# Patient Record
Sex: Female | Born: 1985 | Race: Black or African American | Hispanic: No | Marital: Married | Smoking: Never smoker
Health system: Southern US, Community
[De-identification: ages and names within clinical notes are randomized; demographics above are authoritative.]

## PROBLEM LIST (undated history)

## (undated) DIAGNOSIS — O0991 Supervision of high risk pregnancy, unspecified, first trimester: Secondary | ICD-10-CM

## (undated) DIAGNOSIS — D649 Anemia, unspecified: Secondary | ICD-10-CM

## (undated) DIAGNOSIS — E559 Vitamin D deficiency, unspecified: Secondary | ICD-10-CM

## (undated) DIAGNOSIS — E611 Iron deficiency: Secondary | ICD-10-CM

## (undated) DIAGNOSIS — Z803 Family history of malignant neoplasm of breast: Secondary | ICD-10-CM

## (undated) DIAGNOSIS — U071 COVID-19: Secondary | ICD-10-CM

## (undated) DIAGNOSIS — D219 Benign neoplasm of connective and other soft tissue, unspecified: Secondary | ICD-10-CM

## (undated) HISTORY — DX: COVID-19: U07.1

## (undated) HISTORY — DX: Benign neoplasm of connective and other soft tissue, unspecified: D21.9

## (undated) HISTORY — DX: Supervision of high risk pregnancy, unspecified, first trimester: O09.91

## (undated) HISTORY — DX: Anemia, unspecified: D64.9

## (undated) HISTORY — DX: Iron deficiency: E61.1

## (undated) HISTORY — DX: Family history of malignant neoplasm of breast: Z80.3

## (undated) HISTORY — DX: Vitamin D deficiency, unspecified: E55.9

## (undated) HISTORY — PX: BUTTOCK LIFT: SHX1278

---

## 2010-11-16 ENCOUNTER — Ambulatory Visit: Payer: Self-pay | Admitting: General Practice

## 2010-12-06 ENCOUNTER — Ambulatory Visit: Payer: Self-pay | Admitting: General Practice

## 2011-05-18 ENCOUNTER — Encounter (HOSPITAL_COMMUNITY): Payer: Self-pay | Admitting: *Deleted

## 2011-05-18 ENCOUNTER — Emergency Department: Payer: Self-pay | Admitting: Emergency Medicine

## 2011-05-18 ENCOUNTER — Emergency Department (HOSPITAL_COMMUNITY)
Admission: EM | Admit: 2011-05-18 | Discharge: 2011-05-18 | Disposition: A | Discharge status: Home / Self Care | Payer: No Typology Code available for payment source | Attending: Emergency Medicine | Admitting: Emergency Medicine

## 2011-05-18 DIAGNOSIS — M545 Low back pain, unspecified: Secondary | ICD-10-CM | POA: Insufficient documentation

## 2011-05-18 DIAGNOSIS — T148XXA Other injury of unspecified body region, initial encounter: Secondary | ICD-10-CM | POA: Insufficient documentation

## 2011-05-18 DIAGNOSIS — E119 Type 2 diabetes mellitus without complications: Secondary | ICD-10-CM | POA: Insufficient documentation

## 2011-05-18 DIAGNOSIS — M542 Cervicalgia: Secondary | ICD-10-CM | POA: Insufficient documentation

## 2011-05-18 MED ORDER — CYCLOBENZAPRINE HCL 10 MG PO TABS
5.0000 mg | ORAL_TABLET | Freq: Once | ORAL | Status: AC
  Administered 2011-05-18: 5 mg via ORAL
  Filled 2011-05-18: qty 1

## 2011-05-18 MED ORDER — IBUPROFEN 800 MG PO TABS: 800.0000 mg | ORAL_TABLET | Freq: Three times a day (TID) | ORAL | Status: AC

## 2011-05-18 MED ORDER — IBUPROFEN 800 MG PO TABS
800.0000 mg | ORAL_TABLET | Freq: Once | ORAL | Status: AC
  Administered 2011-05-18: 800 mg via ORAL
  Filled 2011-05-18: qty 1

## 2011-05-18 MED ORDER — CYCLOBENZAPRINE HCL 10 MG PO TABS: 10.0000 mg | ORAL_TABLET | Freq: Two times a day (BID) | ORAL | Status: AC | PRN

## 2011-05-18 NOTE — ED Notes (Signed)
Patient given discharge paperwork; went over discharge instructions with patient.  Patient instructed to take Flexeril and ibuprofen as directed, to follow up with referral (orthopedist), and to return to the ED for new, worsening, or concerning symptoms.
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
## 2011-05-18 NOTE — ED Provider Notes (Signed)
Medical screening examination/treatment/procedure(s) were performed by non-physician practitioner and as supervising physician I was immediately available for consultation/collaboration.   Sunnie Nielsen, MD 05/18/11 [phone]
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
## 2011-05-18 NOTE — ED Notes (Signed)
PA at bedside.
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
## 2011-05-18 NOTE — ED Notes (Signed)
Patient currently sitting up in bed; no respiratory or acute distress noted.  Patient updated on plan of care; informed patient that we are currently waiting on orders/disposition from PA.  Patient has no other questions or concerns at this time; will continue to monitor.

## 2011-09-19 ENCOUNTER — Other Ambulatory Visit: Payer: Self-pay | Admitting: Internal Medicine

## 2011-09-19 LAB — CBC WITH DIFFERENTIAL/PLATELET
Eosinophil #: 0.1 10*3/uL (ref 0.0–0.7)
Eosinophil %: 2.4 %
Lymphocyte #: 2.1 10*3/uL (ref 1.0–3.6)
Lymphocyte %: 37 %
MCV: 90 fL (ref 80–100)
Monocyte %: 7.3 %
Platelet: 254 10*3/uL (ref 150–440)
RBC: 4.6 10*6/uL (ref 3.80–5.20)
WBC: 5.8 10*3/uL (ref 3.6–11.0)

## 2011-09-19 LAB — COMPREHENSIVE METABOLIC PANEL
Alkaline Phosphatase: 49 U/L — ABNORMAL LOW (ref 50–136)
Bilirubin,Total: 0.4 mg/dL (ref 0.2–1.0)
Calcium, Total: 9 mg/dL (ref 8.5–10.1)
Creatinine: 0.67 mg/dL (ref 0.60–1.30)
EGFR (African American): >60
EGFR (Non-African Amer.): >60
Glucose: 96 mg/dL (ref 65–99)
SGOT(AST): 15 U/L (ref 15–37)
SGPT (ALT): 30 U/L (ref 12–78)
Sodium: 139 mmol/L (ref 136–145)

## 2011-09-19 LAB — LIPID PANEL
Cholesterol: 138 mg/dL (ref 0–200)
HDL Cholesterol: 46 mg/dL (ref 40–60)
Triglycerides: 48 mg/dL (ref 0–200)

## 2011-09-26 ENCOUNTER — Other Ambulatory Visit: Payer: Self-pay | Admitting: Internal Medicine

## 2011-09-28 ENCOUNTER — Ambulatory Visit: Payer: Self-pay

## 2012-05-02 ENCOUNTER — Emergency Department: Payer: Self-pay | Admitting: Internal Medicine

## 2012-05-11 ENCOUNTER — Ambulatory Visit: Payer: Self-pay | Admitting: Obstetrics & Gynecology

## 2012-06-04 ENCOUNTER — Ambulatory Visit: Payer: Self-pay | Admitting: Obstetrics & Gynecology

## 2012-06-15 ENCOUNTER — Encounter: Payer: Self-pay | Admitting: Obstetrics and Gynecology

## 2012-06-15 LAB — PROTEIN / CREATININE RATIO, URINE
Creatinine, Urine: 46.2 mg/dL (ref 30.0–125.0)
Protein, Random Urine: <5 mg/dL — ABNORMAL LOW (ref 0–12)

## 2012-06-15 LAB — COMPREHENSIVE METABOLIC PANEL
Alkaline Phosphatase: 46 U/L — ABNORMAL LOW (ref 50–136)
Anion Gap: 7 (ref 7–16)
Co2: 24 mmol/L (ref 21–32)
Creatinine: 0.55 mg/dL — ABNORMAL LOW (ref 0.60–1.30)
Osmolality: 271 (ref 275–301)
SGPT (ALT): 28 U/L (ref 12–78)
Sodium: 137 mmol/L (ref 136–145)

## 2012-07-06 ENCOUNTER — Encounter: Payer: Self-pay | Admitting: Obstetrics and Gynecology

## 2012-08-10 ENCOUNTER — Encounter: Payer: Self-pay | Admitting: Maternal & Fetal Medicine

## 2012-09-07 ENCOUNTER — Encounter: Payer: Self-pay | Admitting: Obstetrics & Gynecology

## 2012-10-26 ENCOUNTER — Inpatient Hospital Stay: Payer: Self-pay

## 2012-10-26 LAB — CBC WITH DIFFERENTIAL/PLATELET
Basophil %: 0.1 %
HGB: 13 g/dL (ref 12.0–16.0)
Lymphocyte #: 1.3 10*3/uL (ref 1.0–3.6)
Lymphocyte %: 16.6 %
Platelet: 181 10*3/uL (ref 150–440)
RDW: 14.5 % (ref 11.5–14.5)
WBC: 8 10*3/uL (ref 3.6–11.0)

## 2013-09-07 ENCOUNTER — Ambulatory Visit: Payer: Self-pay | Admitting: Specialist

## 2013-09-09 ENCOUNTER — Ambulatory Visit: Payer: Self-pay | Admitting: Specialist

## 2013-09-17 ENCOUNTER — Ambulatory Visit: Payer: Self-pay | Admitting: Specialist

## 2013-09-21 ENCOUNTER — Other Ambulatory Visit: Payer: Self-pay | Admitting: Internal Medicine

## 2013-09-21 ENCOUNTER — Other Ambulatory Visit: Payer: Self-pay | Admitting: Specialist

## 2013-09-21 LAB — COMPREHENSIVE METABOLIC PANEL
ALBUMIN: 3.6 g/dL (ref 3.4–5.0)
ALT: 34 U/L
ANION GAP: 9 (ref 7–16)
Alkaline Phosphatase: 82 U/L
BILIRUBIN TOTAL: 0.3 mg/dL (ref 0.2–1.0)
BUN: 10 mg/dL (ref 7–18)
Calcium, Total: 8.4 mg/dL — ABNORMAL LOW (ref 8.5–10.1)
Chloride: 107 mmol/L (ref 98–107)
Co2: 26 mmol/L (ref 21–32)
Creatinine: 0.69 mg/dL (ref 0.60–1.30)
GLUCOSE: 109 mg/dL — AB (ref 65–99)
Osmolality: 283 (ref 275–301)
POTASSIUM: 3.5 mmol/L (ref 3.5–5.1)
SGOT(AST): 20 U/L (ref 15–37)
Sodium: 142 mmol/L (ref 136–145)
TOTAL PROTEIN: 6.9 g/dL (ref 6.4–8.2)

## 2013-09-21 LAB — LIPID PANEL
CHOLESTEROL: 97 mg/dL (ref 0–200)
HDL: 31 mg/dL — AB (ref 40–60)
LDL CHOLESTEROL, CALC: 50 mg/dL (ref 0–100)
TRIGLYCERIDES: 82 mg/dL (ref 0–200)
VLDL CHOLESTEROL, CALC: 16 mg/dL (ref 5–40)

## 2013-09-21 LAB — CBC WITH DIFFERENTIAL/PLATELET
Basophil #: 0 10*3/uL (ref 0.0–0.1)
Basophil %: 0.2 %
EOS ABS: 0.2 10*3/uL (ref 0.0–0.7)
Eosinophil %: 3.5 %
HCT: 41.1 % (ref 35.0–47.0)
HGB: 13.9 g/dL (ref 12.0–16.0)
LYMPHS ABS: 1.7 10*3/uL (ref 1.0–3.6)
Lymphocyte %: 35.6 %
MCH: 30.6 pg (ref 26.0–34.0)
MCHC: 33.8 g/dL (ref 32.0–36.0)
MCV: 91 fL (ref 80–100)
Monocyte #: 0.4 x10 3/mm (ref 0.2–0.9)
Monocyte %: 7.6 %
NEUTROS PCT: 53.1 %
Neutrophil #: 2.5 10*3/uL (ref 1.4–6.5)
PLATELETS: 239 10*3/uL (ref 150–440)
RBC: 4.53 10*6/uL (ref 3.80–5.20)
RDW: 13.4 % (ref 11.5–14.5)
WBC: 4.7 10*3/uL (ref 3.6–11.0)

## 2013-09-21 LAB — IRON AND TIBC
Iron Bind.Cap.(Total): 305 ug/dL (ref 250–450)
Iron Saturation: 24 %
Iron: 74 ug/dL (ref 50–170)
Unbound Iron-Bind.Cap.: 231 ug/dL

## 2013-09-21 LAB — MAGNESIUM: MAGNESIUM: 1.4 mg/dL — AB

## 2013-09-21 LAB — LIPASE, BLOOD: Lipase: 152 U/L (ref 73–393)

## 2013-09-21 LAB — BILIRUBIN, DIRECT

## 2013-09-21 LAB — TSH: Thyroid Stimulating Horm: 0.652 u[IU]/mL

## 2013-09-21 LAB — FERRITIN: FERRITIN (ARMC): 40 ng/mL (ref 8–388)

## 2013-09-21 LAB — HEMOGLOBIN A1C: HEMOGLOBIN A1C: 5.8 % (ref 4.2–6.3)

## 2013-09-21 LAB — APTT: ACTIVATED PTT: 33.8 s (ref 23.6–35.9)

## 2013-09-21 LAB — PHOSPHORUS: Phosphorus: 3.2 mg/dL (ref 2.5–4.9)

## 2013-09-21 LAB — PROTIME-INR
INR: 1
Prothrombin Time: 12.9 secs (ref 11.5–14.7)

## 2013-09-21 LAB — FOLATE: Folic Acid: 16 ng/mL (ref 3.1–100.0)

## 2013-09-21 LAB — AMYLASE: AMYLASE: 31 U/L (ref 25–115)

## 2013-10-05 ENCOUNTER — Ambulatory Visit: Payer: Self-pay | Admitting: Specialist

## 2013-10-20 DIAGNOSIS — E669 Obesity, unspecified: Secondary | ICD-10-CM | POA: Insufficient documentation

## 2013-10-28 ENCOUNTER — Ambulatory Visit: Payer: Self-pay | Admitting: Gastroenterology

## 2013-10-29 LAB — PATHOLOGY REPORT

## 2013-11-04 ENCOUNTER — Ambulatory Visit: Payer: Self-pay | Admitting: Specialist

## 2013-11-11 ENCOUNTER — Ambulatory Visit: Payer: Self-pay | Admitting: Specialist

## 2013-12-05 ENCOUNTER — Ambulatory Visit: Payer: Self-pay | Admitting: Specialist

## 2014-01-04 HISTORY — PX: BARIATRIC SURGERY: SHX1103

## 2014-01-06 ENCOUNTER — Ambulatory Visit: Payer: Self-pay | Admitting: Internal Medicine

## 2014-01-06 LAB — LIPID PANEL
CHOLESTEROL: 144 mg/dL (ref 0–200)
HDL Cholesterol: 44 mg/dL (ref 40–60)
Ldl Cholesterol, Calc: 86 mg/dL (ref 0–100)
TRIGLYCERIDES: 72 mg/dL (ref 0–200)
VLDL CHOLESTEROL, CALC: 14 mg/dL (ref 5–40)

## 2014-01-06 LAB — BASIC METABOLIC PANEL
Anion Gap: 8 (ref 7–16)
BUN: 15 mg/dL (ref 7–18)
CALCIUM: 8.4 mg/dL — AB (ref 8.5–10.1)
CO2: 24 mmol/L (ref 21–32)
CREATININE: 0.67 mg/dL (ref 0.60–1.30)
Chloride: 107 mmol/L (ref 98–107)
EGFR (African American): >60
EGFR (Non-African Amer.): >60
Glucose: 116 mg/dL — ABNORMAL HIGH (ref 65–99)
Osmolality: 279 (ref 275–301)
POTASSIUM: 3.6 mmol/L (ref 3.5–5.1)
Sodium: 139 mmol/L (ref 136–145)

## 2014-01-06 LAB — CBC WITH DIFFERENTIAL/PLATELET
BASOS ABS: 0 10*3/uL (ref 0.0–0.1)
BASOS PCT: 0.1 %
EOS PCT: 2.5 %
Eosinophil #: 0.1 10*3/uL (ref 0.0–0.7)
HCT: 42.2 % (ref 35.0–47.0)
HGB: 13.8 g/dL (ref 12.0–16.0)
LYMPHS ABS: 2.1 10*3/uL (ref 1.0–3.6)
Lymphocyte %: 46.1 %
MCH: 29.6 pg (ref 26.0–34.0)
MCHC: 32.6 g/dL (ref 32.0–36.0)
MCV: 91 fL (ref 80–100)
MONO ABS: 0.3 x10 3/mm (ref 0.2–0.9)
Monocyte %: 7.2 %
NEUTROS ABS: 2 10*3/uL (ref 1.4–6.5)
NEUTROS PCT: 44.1 %
Platelet: 225 10*3/uL (ref 150–440)
RBC: 4.64 10*6/uL (ref 3.80–5.20)
RDW: 13.7 % (ref 11.5–14.5)
WBC: 4.6 10*3/uL (ref 3.6–11.0)

## 2014-01-06 LAB — HEPATIC FUNCTION PANEL A (ARMC)
ALBUMIN: 3.9 g/dL (ref 3.4–5.0)
ALT: 40 U/L
AST: 25 U/L (ref 15–37)
Alkaline Phosphatase: 63 U/L
BILIRUBIN TOTAL: 0.4 mg/dL (ref 0.2–1.0)
Bilirubin, Direct: <0.1 mg/dL (ref 0.0–0.2)
Total Protein: 7.2 g/dL (ref 6.4–8.2)

## 2014-01-06 LAB — MAGNESIUM: Magnesium: 1.7 mg/dL — ABNORMAL LOW

## 2014-01-06 LAB — TSH: Thyroid Stimulating Horm: 1.22 u[IU]/mL

## 2014-01-06 IMAGING — US US OB FOLLOW-UP - NRPT [facility]
1 series · 14 of 28 positions shown · non-contrast
Comparison: none

[Series 1: us ob follow-up - nrpt [facility] · 0.28mm/px · 14 of 65 slices shown]
[im 3/65]
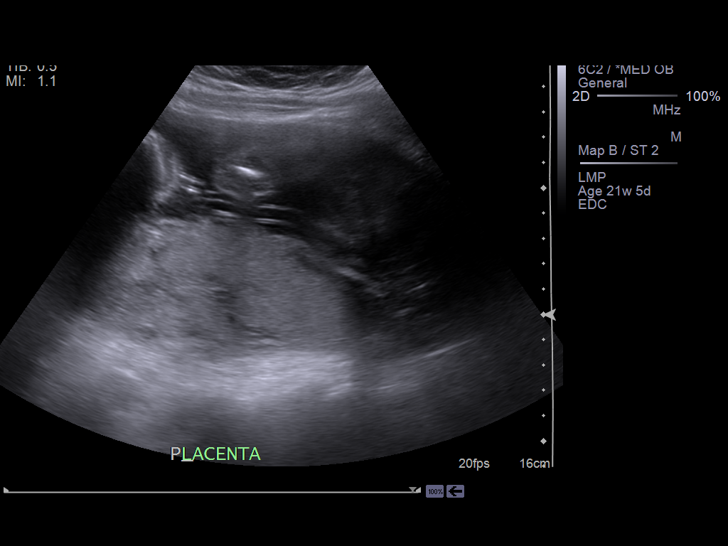
[im 8/65]
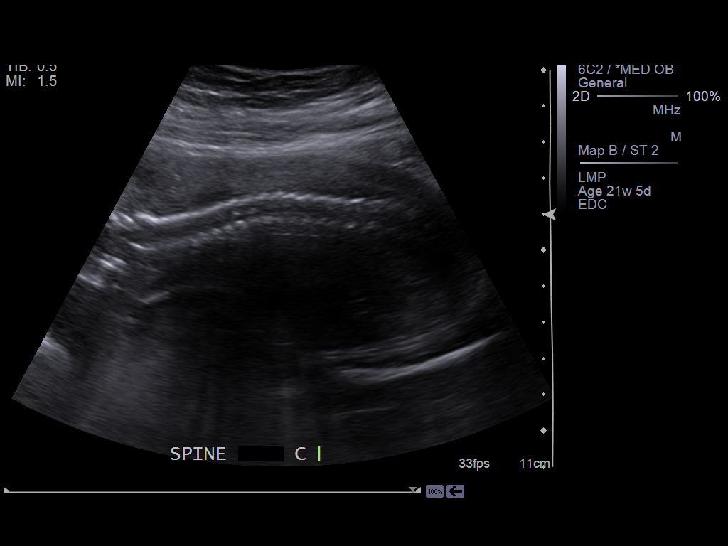
[im 12/65]
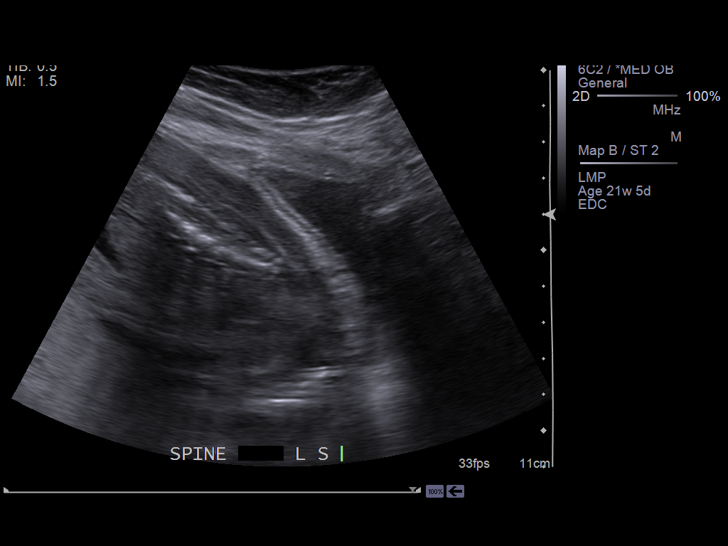
[im 17/65]
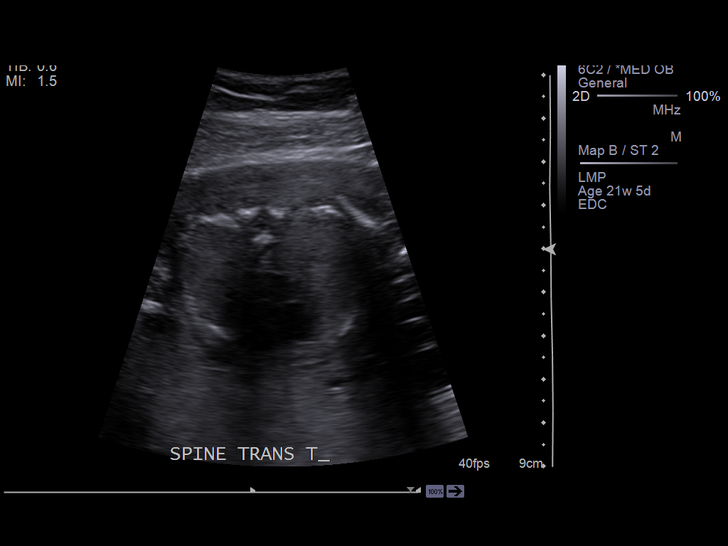
[im 22/65]
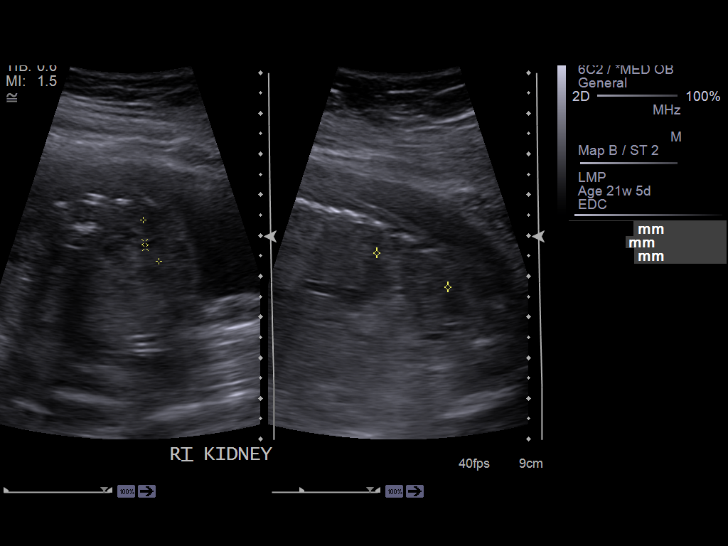
[im 27/65]
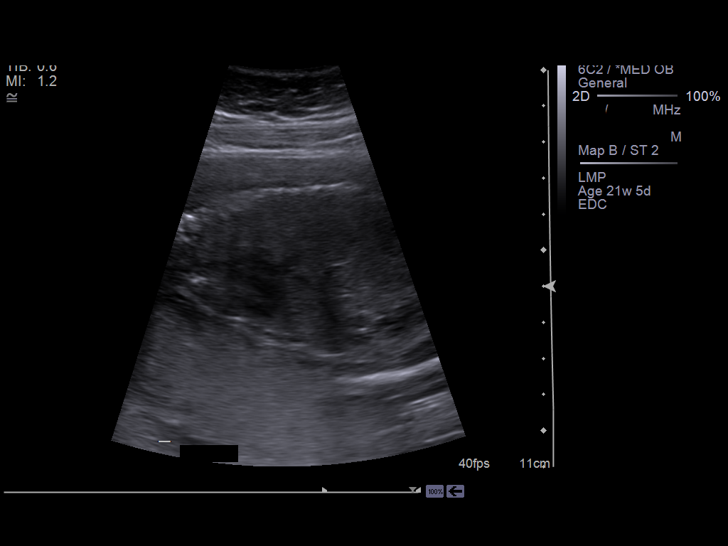
[im 31/65]
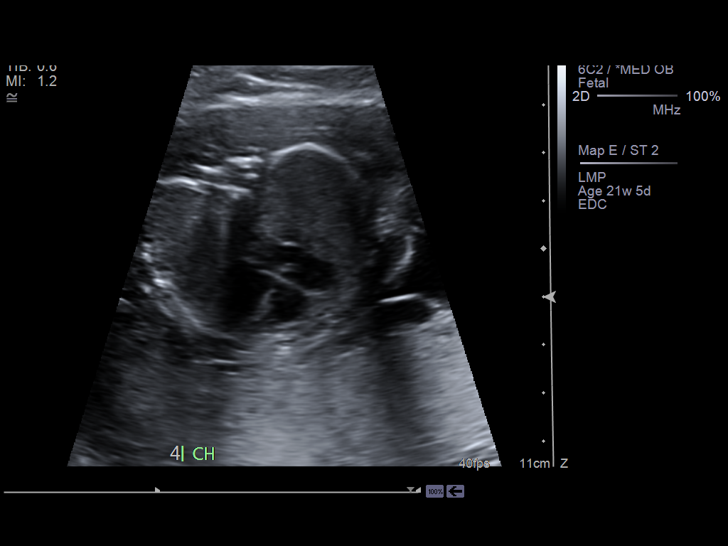
[im 36/65]
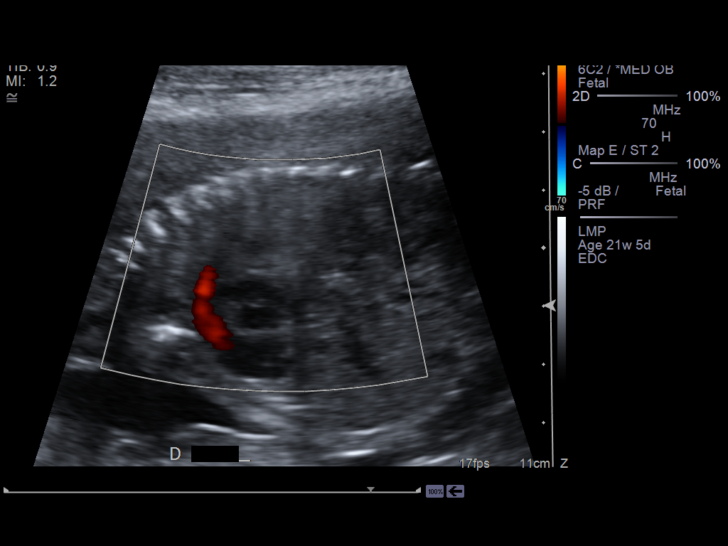
[im 41/65]
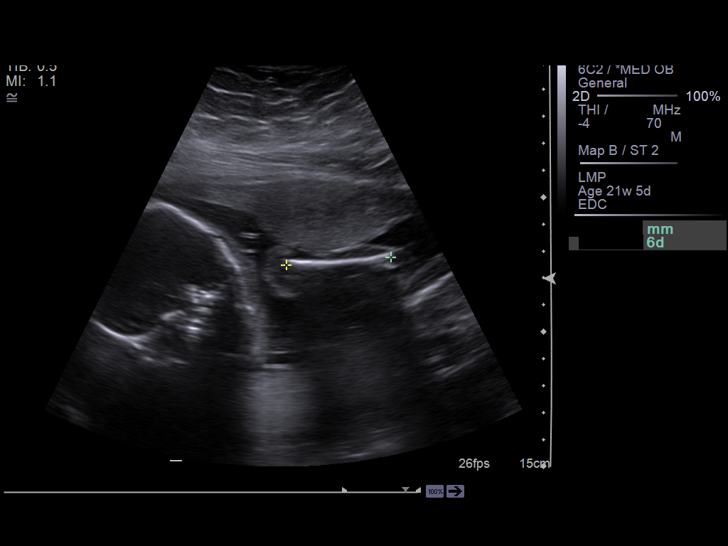
[im 46/65]
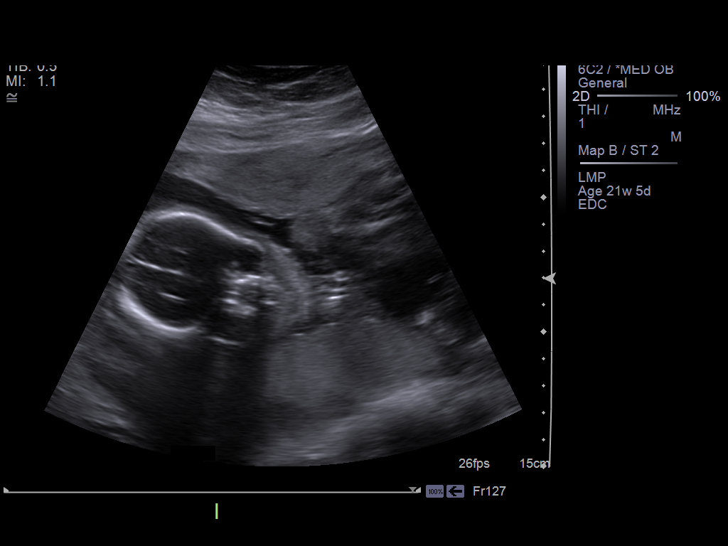
[im 50/65]
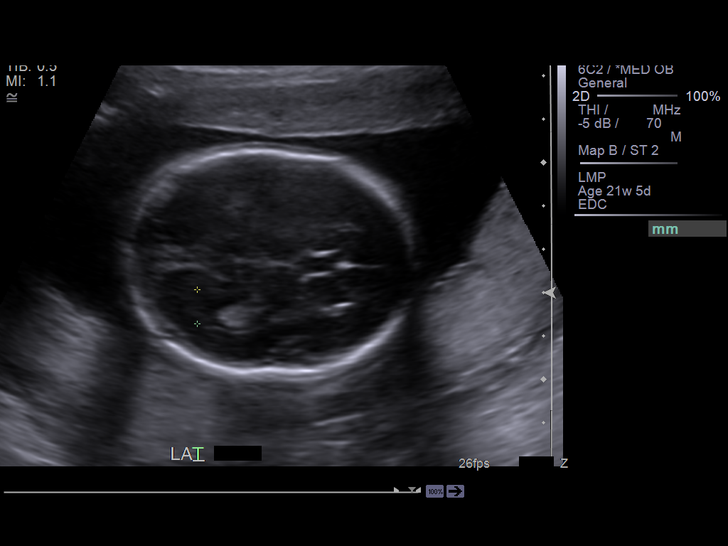
[im 55/65]
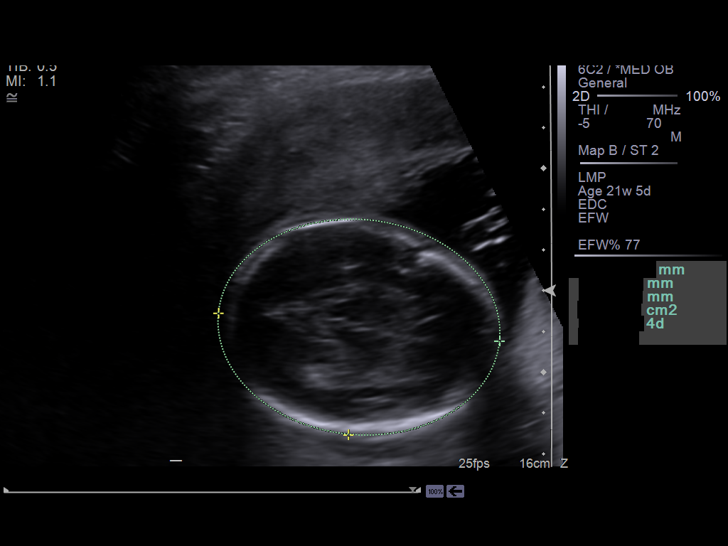
[im 60/65]
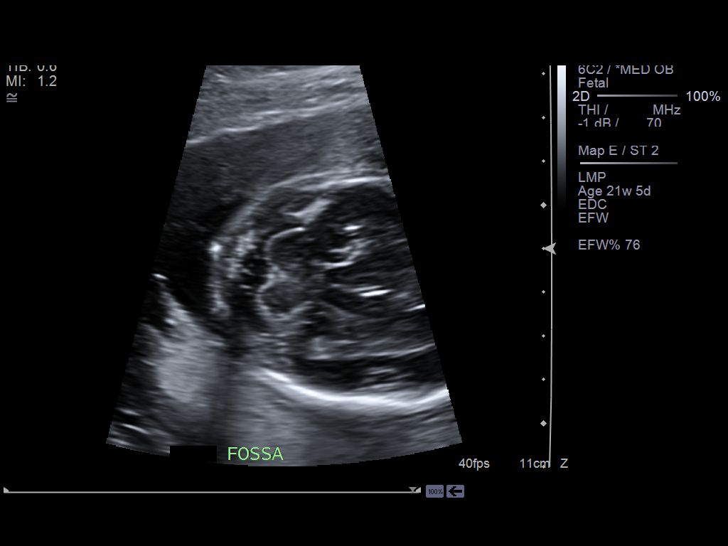
[im 65/65]
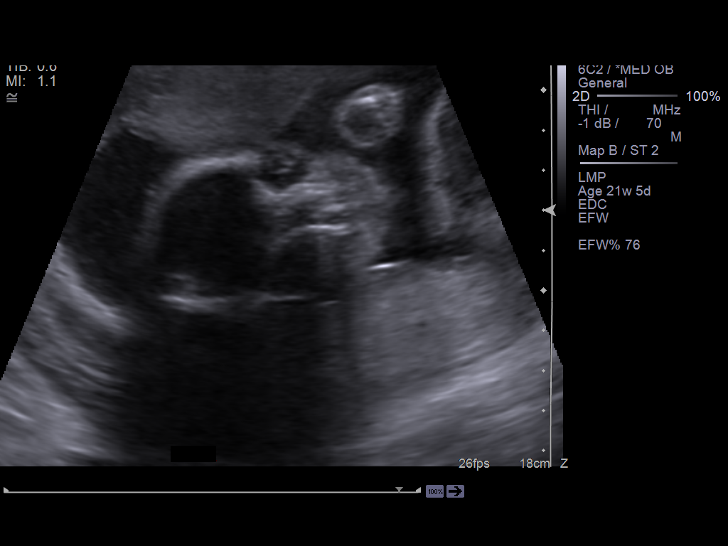

[14 of 28 positions shown; findings below may reference images not displayed]

IMAGES IMPORTED FROM THE SYNGO WORKFLOW SYSTEM
NO DICTATION FOR STUDY

## 2014-01-11 ENCOUNTER — Ambulatory Visit: Payer: Self-pay | Admitting: Specialist

## 2014-01-13 LAB — HM PAP SMEAR

## 2014-01-18 ENCOUNTER — Inpatient Hospital Stay: Payer: Self-pay | Admitting: Bariatrics

## 2014-01-18 LAB — CREATININE, SERUM: CREATININE: 0.74 mg/dL (ref 0.60–1.30)

## 2014-01-19 LAB — BASIC METABOLIC PANEL
ANION GAP: 6 — AB (ref 7–16)
BUN: 8 mg/dL (ref 7–18)
Calcium, Total: 7.4 mg/dL — ABNORMAL LOW (ref 8.5–10.1)
Chloride: 105 mmol/L (ref 98–107)
Co2: 24 mmol/L (ref 21–32)
Creatinine: 0.63 mg/dL (ref 0.60–1.30)
EGFR (African American): >60
EGFR (Non-African Amer.): >60
Glucose: 94 mg/dL (ref 65–99)
OSMOLALITY: 268 (ref 275–301)
Potassium: 3.6 mmol/L (ref 3.5–5.1)
Sodium: 135 mmol/L — ABNORMAL LOW (ref 136–145)

## 2014-01-19 LAB — CBC WITH DIFFERENTIAL/PLATELET
BASOS ABS: 0 10*3/uL (ref 0.0–0.1)
Basophil %: 0.1 %
EOS ABS: 0 10*3/uL (ref 0.0–0.7)
EOS PCT: 0.1 %
HCT: 41 % (ref 35.0–47.0)
HGB: 13.5 g/dL (ref 12.0–16.0)
LYMPHS ABS: 1.6 10*3/uL (ref 1.0–3.6)
Lymphocyte %: 20.7 %
MCH: 29.6 pg (ref 26.0–34.0)
MCHC: 32.8 g/dL (ref 32.0–36.0)
MCV: 90 fL (ref 80–100)
MONO ABS: 0.5 x10 3/mm (ref 0.2–0.9)
MONOS PCT: 6.4 %
Neutrophil #: 5.7 10*3/uL (ref 1.4–6.5)
Neutrophil %: 72.7 %
Platelet: 239 10*3/uL (ref 150–440)
RBC: 4.55 10*6/uL (ref 3.80–5.20)
RDW: 13.5 % (ref 11.5–14.5)
WBC: 7.8 10*3/uL (ref 3.6–11.0)

## 2014-01-19 LAB — MAGNESIUM: Magnesium: 1.4 mg/dL — ABNORMAL LOW

## 2014-01-19 LAB — PHOSPHORUS: Phosphorus: 2.9 mg/dL (ref 2.5–4.9)

## 2014-01-19 LAB — ALBUMIN: Albumin: 3.3 g/dL — ABNORMAL LOW (ref 3.4–5.0)

## 2014-02-11 ENCOUNTER — Ambulatory Visit: Payer: Self-pay | Admitting: Bariatrics

## 2014-03-07 ENCOUNTER — Ambulatory Visit: Payer: Self-pay | Admitting: Bariatrics

## 2014-05-17 ENCOUNTER — Encounter: Payer: Self-pay | Admitting: *Deleted

## 2014-05-27 NOTE — Consult Note (Signed)
Referral Information:  Reason for Referral Review Sugar Log   Referring Physician [facility]   Prenatal Hx Terri Ho is a 29 year-old G4 P0030 at 5 5/7 weeks ([place] 11/11/12) who presents for fetal growth scan and to review her sugar log. She has type II diabetes and is on metformin 500 mg in the morning and 1000 mg in the evening. She reports good fetal movment.   Home Medications: Medication Instructions Status  Metformin 500mg  one tablet in AM and 2 tablets at bedtime   1 tablet in AM and 2 tablets at bedtime Active  Prenatal Multivitamins Prenatal Multivitamins oral tablet 1 tab(s) orally once a day Active  Aspir 81 81 mg oral tablet 1 tab(s) orally once a day Active   Allergies:   No Known Allergies:   Vital Signs/Notes:  Nursing Vital Signs: **Vital Signs.:   04-Aug-14 13:35  Pulse Pulse 188  Systolic BP Systolic BP 416  Diastolic BP (mmHg) Diastolic BP (mmHg) 67   Routine Chem:  12-May-14 11:40   Hemoglobin A1c ([facility]) 4.9 (The American Diabetes Association recommends that a primary goal of therapy should be <7% and that physicians should reevaluate the treatment regimen in patients with HbA1c values consistently >8%.)    Additional Lab/Radiology Notes Accucheck (08/17/12-09/07/12) FS: 61-107.  Majority are 80's to low 90;s 2hr PP lunch: 67-82 2hr PP dinner: 78-110, single 132  [place] ([facility], 09/07/12): See report from today   Impression/Recommendations:  Impression 29 year-old G4 P0030 at 82 5/7 weeks with well-controlled type II diabets on metformin.  Recent A1C at [place] was 4.9%. Growth scan today appropriate.   Recommendations -Continue Metformin 500 mg am and 1000 mg HS -Start weekly fetal testing at 32 weeks, increasing to twice weekly at 34 weeks. We will be happy to perform if desired. (appts not yet made) -Recommend growth scan in 4-6 weeks. We will be happy to perform if desired -Return in 3 weeks to review sugar lot.    Total Time Spent with  Patient 15 minutes   >50% of visit spent in couseling/coordination of care yes   Office Use Only 99213  Office Visit Level 3 (8min) EST exp prob focused outpt   Coding Description: MATERNAL CONDITIONS/HISTORY INDICATION(S).   Diabetes - DM.  Electronic Signatures: Suezette Lafave, [place] (MD)  (Signed 04-Aug-14 13:54)  Authored: Referral, Home Medications, Allergies, Vital Signs/Notes, Lab, Lab/Radiology Notes, Impression, Billing, Coding Description   Last Updated: 04-Aug-14 13:54 by Taijon Vink, [place] (MD)

## 2014-05-27 NOTE — Consult Note (Signed)
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 Patient has a history of type 2 Diabetes on metformin - her HGBA1c in 2013 was 6.4 on 500 bid . Her hgb A1c was 4.9 on 04/30/12 and again at her last visit here in May . [facility] and is  recording her BS on a piece of paper  . Her blood sugar is occasionally elevated esp after dietary indiscretion. Her fiancee has a physical job and is lean and eats more heavily than she can.   Past Obstetrical Hx Para 0030 - (pt's parents were present again. I did not  discuss) review of records last one spontaneous SAB no D&C followed by Fogelman   Home Medications: Medication Instructions Status  Metformin 500mg one tablet in AM and 2 tablets at bedtime   1 tablet in AM and 2 tablets at bedtime Active  Prenatal Multivitamins Prenatal Multivitamins oral tablet 1 tab(s) orally once a day Active   Allergies:   No Known Allergies:   Vital Signs/Notes:  Nursing Vital Signs: **Vital Signs.:   02-Jun-14 08:12  Vital Signs Type Routine  Temperature Temperature (F) 99.1  Celsius 37.2  Temperature Source oral  Pulse Pulse 95  Respirations Respirations 18  Systolic BP Systolic BP 121  Diastolic BP (mmHg) Diastolic BP (mmHg) 64  Mean BP 83   Perinatal Consult:  LMP 05-Feb-2012   PMed Hx Rubella Immune, Hx of varicella   Past Medical History cont'd type 2 diabetes - treated with metformin prepregnancy  started in July 2013 with Dr fogelman -at that time  HGB A1c noted to be 6.7 started 500 bid,  now on 500/1000, last hgb a1c 4.9 in march - repeated on 5/14  4.9   PSurg Hx none noted   FHx metabolic disorders either side,  hYpertension - father is on 130 units of insulin per day   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 [employer] in [place]   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  Result(s) reported on 15 Jun 2012 at 12:12PM.    11:40   Hemoglobin A1c ([facility]) 4.9 (The American Diabetes Association recommends that a primary goal of therapy should be <7% and that physicians should reevaluate the treatment regimen in patients with HbA1c values consistently >8%.)  Glucose, Serum 90  BUN 7  Creatinine (comp)  0.55  Sodium, Serum 137  Potassium, Serum 3.8  Chloride, Serum 106  CO2, Serum 24  Calcium (Total), Serum 8.6  Osmolality (calc) 271  eGFR (African American) >  60  eGFR (Non-African American) >60 (eGFR values <74m/min/1.73 m2 may be an indication of chronic kidney disease (CKD). Calculated eGFR is useful in patients with stable renal function. The eGFR calculation will not be reliable in acutely ill patients when serum creatinine is changing rapidly. It is not useful in  patients on dialysis. The eGFR calculation may not be applicable to patients at the low and high extremes of body sizes, pregnant women, and vegetarians.)  Anion Gap 7  Misc Urine Chem:  12-May-14 11:18   Creatinine, Urine 46.2  Protein, Random Urine  < 5  Protein/Creat Ratio (comp) SEE COMMENT    Additional Lab/Radiology Notes u/s  done today -see report RBS 81 blood sugar log difficult to interpret   FBS 86-123 2 h pp 66- 182 most in  120 range pt attributes outliers to eating sweets   Impression/Recommendations:  Impression IUP at 21 5/7- normal anatomy today - given early hgb A1c innormal range - i did not order a  fetal echo  2 type 2 DM on metformin 500/1000 with mild elevation in   FBS and occ elevated pp , excellent HGB A1c at 4.9 - occ dietary indiscretions - skips breakfast - likes a large evening meal . [facility] , irregular hours with night shifts  3 Elevated BMI - understands need to limit weight gain- stable so far 4- normal TFTs, neg urinary protein   Recommendations 1. Again Reinforced good diet habits - I am pleased she has limited weight gain, suggested again 3 meals 3 snacks avoiding fried foods or excessive carbohydrate, suggested she see dietitian again , suggested alternatives to ice cream - esp reinforced distributing calories through day . I encouraged better record keeping to try and organize her eating , sleep and record keeping . I suggested she take healthy snacks to work to avoid eating sugary  snacks there. Given lack of weight gain and normal hgbA1c I did not start insulin - I encouraged better habits but she may need to start insulin in third trimester 2 . I again suggested light exercise- walking at the mall - pt is tired from her long shifts , her fiancee works at a physical job so not interested in exercise  3 - We reviewed that blood sugar may go up related to pregnancy and insulin may be indicated if diet/oral meds  don't work to control blood  sugar  4 f/u scan in one month and blood sugar review  5 in meantime pt recommended to see [facility] and review blood sugars there q 1-2 weeks  , if FBS >100 and 2h pp >120-130 , it may be prudent to start some long acting insulin   Plan:  Genetic Counseling no    Ultrasound at what gestational ages Monthly >24 weeks    Antepartum Testing Twice weekly, Starting at 32 weeks   Delivery Mode Vaginal   Additional Testing done    Total Time Spent with Patient 30 minutes   >50% of visit spent in couseling/coordination of care yes   Office Use Only 99213  Office Visit Level 3 (19m) EST exp prob focused outpt   Coding Description: MATERNAL CONDITIONS/HISTORY INDICATION(S).   Diabetes - DM.  Electronic Signatures: LiSharyn CreamerMD)  (Signed 02-Jun-14 09:58)  Authored: Referral, Home Medications, Allergies, Vital Signs/Notes, Consult, Exam, Lab, Lab/Radiology Notes, Impression, Plan, Billing, Coding Description   Last Updated: 02-Jun-14 09:58 by LiSharyn CreamerMD)

## 2014-05-27 NOTE — Op Note (Signed)
PATIENT NAME:  Terri Ho, Terri Ho MR#:  [id number] DATE OF BIRTH:  01/27/1986  DATE OF PROCEDURE:  10/28/2012  PREOPERATIVE DIAGNOSIS: Fetal intolerance to labor and chorioamnionitis.   POSTOPERATIVE DIAGNOSIS: Fetal intolerance to labor and chorioamnionitis.  PROCEDURE: Low transverse cesarean section.   ANESTHESIA: Epidural.   SURGEON: Donzetta Matters, M.D.   ASSISTANTRoslyn Smiling.   ESTIMATED BLOOD LOSS: 600 mL.   OPERATIVE FLUIDS: 700 mL.   COMPLICATIONS: None.   FINDINGS: Vertex female infant, 3340 grams, Apgars 8 and 9, normal uterus, tubes and ovaries.   INDICATIONS: The patient is a 29 year old G1, P0, who presents for induction of labor for a biophysical profile of 2 out of 8. The patient also has a pregnancy complicated by type 2 diabetes. The patient progressed in labor. Did not progress past 5 cm, and the infant began to have multiple late and variable decelerations. Additionally, she had a fever to a maximum of 100.8 and there was fetal tachycardia. The decision was made to deliver by cesarean section. Risks, benefits, indications and alternatives of the procedure were explained, and informed consent was obtained.   PROCEDURE: The patient was taken to the operating room with IV fluids running. She was prepped and draped in the usual sterile fashion with leftward tilt. A Pfannenstiel skin incision was made and carried down to the underlying fascia with the knife. The fascia was nicked in the midline. The incision was extended laterally. The superior aspect of the fascia was grasped with Kocher clamps and the underlying rectus muscle was dissected off. This was repeated on the inferior fascia. The rectus muscles were divided in the midline. The peritoneum was entered bluntly. The opening was extended. Bladder blade was placed. The vesicouterine peritoneum was grasped with the pickups and entered sharply with Metzenbaums. The bladder flap was created digitally. The hysterotomy  incision was made and carried down to the underlying fetal parts. The opening was extended. The infant's head was grasped and delivered atraumatically through the hysterotomy incision. The anterior and posterior shoulders were delivered followed by the remainder of the body. The cord was clamped x 2 and cut. The infant was handed to the awaiting nursery staff. The placenta was expressed. The uterus was exteriorized and cleared of all clot and debris. The hysterotomy incision was repaired with a #0 Monocryl in a running locked fashion. The uterus was returned to the abdomen. The abdomen and gutters were irrigated with copious amounts of warm normal saline. The peritoneum was closed with a #2-0 Vicryl. The On-Q pump apparatus was placed according to the manufacturer's instructions. The fascia was closed with a #1 PDS. The skin was closed with #4-0 Vicryl. The On-Q catheters were each bolused with 5 mL of 0.5% bupivacaine. The catheters were secured to the patient's abdomen using Steri-Strips and Tegaderm. The patient tolerated the procedure well. Sponge, needle and instrument counts were correct x 2, and the patient was taken to the recovery room in stable condition.    ____________________________ Rolm Gala Ferne Reus, MD law:gb D: 10/28/2012 02:33:08 ET T: 10/28/2012 02:50:00 ET JOB#: [id number]  cc: Sherlynn Carbon A. Ferne Reus, MD, <Dictator> Renda Rolls LEE MD ELECTRONICALLY SIGNED 10/29/2012 15:19

## 2014-05-27 NOTE — Consult Note (Signed)
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 Patient has a history of type 2 Diabetes on metformin - her HGBA1c in 2013 was 6.4 on 500 bid . Her hgb A1c was 4.9 on 3/27. [facility]  [facility] and recording her BS . her FBS has been consistently abouve 90-100 . She increased   Past Obstetrical Hx para 18 - (pt's parents were present and she seemed reluctant to discuss) review of records last one spontaneous SAB no D&C followed by [facility]   Home Medications: Medication Instructions Status  Metformin 562m one tablet in AM and 2 tablets at bedtime   1 tablet in AM and 2 tablets at bedtime Active  Prenatal Multivitamins Prenatal Multivitamins oral tablet 1 tab(s) orally once a day Active   Allergies:   No Known Allergies:   Vital Signs/Notes:  Nursing Vital Signs: **Vital Signs.:   12-May-14 08:57  Vital Signs Type Routine  Temperature Temperature (F) 98.1  Celsius 36.7  Temperature Source oral  Pulse Pulse 98  Respirations Respirations 18  Systolic BP Systolic BP 1614 Diastolic BP (mmHg) Diastolic BP (mmHg) 67  Mean BP 84   Perinatal Consult:  LMP 05-Feb-2012   PMed Hx Rubella Immune, Hx of varicella   Past Medical History cont'd type 2 diabetes - treated with metformin prepregnancy  started in July 2013 with Dr fValentino Saxon- HGB A1c noted to be 6.7 500 bid now on 500/1000, last hgb a1c 4.9 in march - repeated today 4.9   PSurg Hx none noted   FHx metabolic disorders either side, hYpertension - father is on 130 units of insulin per day   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[employer] [place]  Occupation Father we did not discuss him with  parents present   Review Of Systems:  Subjective feels well - tryign hard to follow diet - concerned about recent weight loss   Tolerating Diet Yes   Medications/Allergies Reviewed Medications/Allergies reviewed     Hepatic:  12-May-14 11:40   Bilirubin, Total 0.3  Alkaline Phosphatase  46  SGPT (ALT) 28  SGOT (AST)  14  Total Protein, Serum 6.6  Albumin, Serum  3.2  Routine Chem:  12-May-14 11:18   Result Comment PROTEIN/CREATININE - UNABLE TO CALCULATE VALUE DUE TO NON-  - NUMERIC VALUE WITHIN THE CALCULATION.  Result(s) reported on 15 Jun 2012 at 12:12PM.    11:40   Hemoglobin A1c ([facility]) 4.9 (The American Diabetes Association recommends that a primary goal of therapy should be <7% and that physicians should reevaluate the treatment regimen in patients with HbA1c values consistently >8%.)  Glucose, Serum 90  BUN 7  Creatinine (comp)  0.55  Sodium, Serum 137  Potassium, Serum 3.8  Chloride, Serum 106  CO2, Serum 24  Calcium (Total), Serum 8.6  Osmolality (calc) 271  eGFR (African American) >60  eGFR (Non-African American) >60 (eGFR values <680mmin/1.73 m2 may be an indication of chronic kidney disease (CKD). Calculated eGFR is useful in patients with stable renal function. The eGFR calculation will not be reliable in acutely ill patients when serum creatinine is changing rapidly. It is not useful in  patients on dialysis. The eGFR calculation may not be applicable to patients at the low and high extremes of body sizes, pregnant women, and vegetarians.)  Anion Gap 7  Misc Urine Chem:  12-May-14 11:18   Creatinine, Urine 46.2  Protein, Random Urine  < 5  Protein/Creat Ratio (comp) SEE COMMENT    Additional Lab/Radiology Notes u/s done today -see report   Impression/Recommendations:  Impression IUP at 18w 5d  2 type 2 DM on metformin 500/1000 with elevated FBS and occ elevated pp excellent HGB A1c at 4.9 - frequent dietary indiscretions - likes fried food ,  eats inconsistently or in car - breakfast bar is her typical am meal.[facility] , irregular hours with night shifts  3 Elevated BMI - pt has lost weight and is just starting to regain understands need to limit weight gain   Recommendations 1. REinforced good diet habits - 3 meals 3 snacks avoiding fried foods or excessive carbohydrate f/u with [facility]  her mom thinks she is working too hard and not caring for herself enough  2 . Suggested she check early am sugars when goes to bathroom during sleep time to see if blood sugar high all night or if elevated FBS is related to "dawn phenomenon " or rebound  3 Suggested she use a paper sheet to write down sugars to indicate dietary behaviors or night shift work to allow better interpretation of trends- 4- we discussed rationale for careful glycemic control and improved pregnancy and neonatal outcomes , I reviewed that insulin resistance increases and given that she is already on meds she may require addition of glyburide or swtich to insulin if unable to control her sugars - her father is very insulin resistant - we discussed the role that regular light exercise could play 5 increased risk of preeclampsia  with diabetes - neg p/c, excellent CMP - I did not Rx a baby aspirin  6 TSH ordered   Plan:  Genetic Counseling no   Prenatal Diagnosis Options level 2 done today - no echo given normal hgb A1c   Ultrasound at what gestational ages Monthly >24 weeks   Antepartum Testing Twice weekly, Starting at 32 weeks   Delivery Mode Vaginal   Additional Testing Thyroid panel, HbA1C, done    Total Time Spent with Patient 30 minutes   >50% of visit spent in couseling/coordination of care yes   Office Use Only 99242  Level 2 (3mn) NEW office consult exp prob focused   Coding Description: MATERNAL CONDITIONS/HISTORY INDICATION(S).   Diabetes - DM.  Electronic Signatures: LSharyn Creamer(MD)  (Signed 12-May-14  16:12)  Authored: Referral, Home Medications, Allergies, Vital Signs/Notes, Consult, Exam, Lab, Lab/Radiology Notes, Impression, Plan, Billing, Coding Description   Last Updated: 12-May-14 16:12 by LSharyn Creamer(MD)

## 2014-05-28 NOTE — Op Note (Signed)
PATIENT NAME:  Terri Ho, Terri Ho MR#:  [id number] DATE OF BIRTH:  1985/04/03  DATE OF PROCEDURE:  01/18/2014  PREOPERATIVE DIAGNOSIS: Morbid obesity.   POSTOPERATIVE DIAGNOSES:  1.  Morbid obesity.  2.  Hiatal hernia.   PROCEDURE: Laparoscopic sleeve gastrectomy with hiatal hernia repair.   SURGEON: Kreg Shropshire, MD.   ANESTHESIA: General endotracheal.   COMPLICATIONS: None.   DETAILS OF CLINICAL HISTORY: See H and P.   SPECIMENS: Portion of stomach.   ESTIMATED BLOOD LOSS: None.   ASSISTANT: Valaria Good, PA-C.   DETAILS OF PROCEDURE: The patient was taken to the operating room, placed on the operating table in the supine position. Appropriate monitors and supplemental oxygen delivered. Broad-spectrum IV antibiotics were administered. The patient was placed under general anesthesia without incident. The abdomen was prepped and draped in the usual sterile fashion.  The abdomen was accessed using a 5 mL optical trocar in the left upper quadrant. Pneumoperitoneum was established. Multiple other trocars were placed in preparation for the procedure. The liver retractor was placed. The hiatus was explored. A small hiatal hernia was present. Because of the increased incidence of acid reflux after surgery I enclosed that with an interrupted Ethibond suture with good effect. The greater curvature of the stomach was mobilized from 5 cm from the pylorus all the way to the fundus and posteriorly as well. Once we had that freed up I advanced a 34 French bougie down into the antrum and then bisected the antrum with the Echelon green load reinforced with Seamguard stapler. Multiple gold loads with Seamguard were then fired in a path parallel to the lesser curvature. Excess stomach was removed. Hemostasis was excellent. No evidence of bleeding or leak was present. The trocars and liver retractor were removed without incident, wounds closed using 4-0 Vicryl and Dermabond. The patient tolerated the procedure well  and went to recovery in stable condition.  The patient will be admitted for further care.     ____________________________ Kreg Shropshire, MD jb:bu D: 01/18/2014 14:53:40 ET T: 01/18/2014 15:03:59 ET JOB#: [id number]  cc: Kreg Shropshire, MD, <Dictator> Isla Pence, MD Bonner Puna MD ELECTRONICALLY SIGNED 01/18/2014 17:13

## 2014-05-30 LAB — SURGICAL PATHOLOGY

## 2014-06-14 NOTE — H&P (Signed)
L&D Evaluation:  History:  HPI Pt is a 29 yo G4P0030 at 37.[redacted] weeks GA with a history of DM type II who presents to L&D after being sent from the office for a NR-NST and a BPP with a score of 2/8. She is being sent over for induction to deliver. Her blood glucose upon arrival was 77. Pt is O+, VI, RI, GBS negative. TDaP and Flu vaccine given this pregnancy.   Presents with NR NST, BPP 2/8   Patient's Medical History Diabetes  other  vitamin D deficiency   Patient's Surgical History none   Medications Pre [place] Vitamins  other  metformin   Allergies NKDA   Social History none   Family History other  DM type II   ROS:  ROS All systems were reviewed.  HEENT, CNS, GI, GU, Respiratory, CV, Renal and Musculoskeletal systems were found to be normal.   Exam:  Vital Signs stable   General no apparent distress   Mental Status clear   Chest clear   Heart normal sinus rhythm   Abdomen gravid, non-tender   Back no CVAT   Edema no edema   Mebranes Intact   FHT 150's, moderate variability   Ucx irregular   Skin dry, no lesions   Lymph no lymphadenopathy   Impression:  Impression induction of labor for NR NST and 2/8 BPP   Plan:  Plan continue labor induction with cervadil   Follow Up Appointment need to schedule   Electronic Signatures: Louisa Second (CNM)  (Signed 22-Sep-14 13:45)  Authored: L&D Evaluation   Last Updated: 22-Sep-14 13:45 by Louisa Second (CNM)

## 2014-11-05 HISTORY — PX: OTHER SURGICAL HISTORY: SHX169

## 2015-04-27 DIAGNOSIS — N898 Other specified noninflammatory disorders of vagina: Secondary | ICD-10-CM | POA: Diagnosis not present

## 2015-06-06 ENCOUNTER — Ambulatory Visit: Payer: Self-pay | Admitting: Family

## 2015-06-06 ENCOUNTER — Encounter: Payer: Self-pay | Admitting: Physician Assistant

## 2015-06-06 VITALS — BP 90/70 | HR 80 | Temp 99.0°F

## 2015-06-06 DIAGNOSIS — R300 Dysuria: Secondary | ICD-10-CM

## 2015-06-06 DIAGNOSIS — Z299 Encounter for prophylactic measures, unspecified: Secondary | ICD-10-CM

## 2015-06-06 DIAGNOSIS — N3001 Acute cystitis with hematuria: Secondary | ICD-10-CM

## 2015-06-06 LAB — POCT URINALYSIS DIPSTICK
BILIRUBIN UA: NEGATIVE
GLUCOSE UA: NEGATIVE
Ketones, UA: NEGATIVE
NITRITE UA: NEGATIVE
Spec Grav, UA: >=1.03
Urobilinogen, UA: 0.2
pH, UA: 5.5

## 2015-06-06 MED ORDER — PHENAZOPYRIDINE HCL 200 MG PO TABS: 200.0000 mg | ORAL_TABLET | Freq: Three times a day (TID) | ORAL | Status: DC | PRN

## 2015-06-06 MED ORDER — FLUCONAZOLE 150 MG PO TABS: ORAL_TABLET | ORAL | Status: DC

## 2015-06-06 MED ORDER — NITROFURANTOIN MONOHYD MACRO 100 MG PO CAPS: 100.0000 mg | ORAL_CAPSULE | Freq: Two times a day (BID) | ORAL | Status: DC

## 2015-06-06 NOTE — Progress Notes (Signed)
S/ frequency dysuria , without red flags, decreased po fluids,  O/ low grade temp other VS stable alert NAD, heart rsr lungs clear Abd nontender U/a with blood, protein and WBC s  AUTI  P macrobid, diflucan, pyridium increase po fluids follow up prn not improving.

## 2015-07-13 DIAGNOSIS — H5213 Myopia, bilateral: Secondary | ICD-10-CM | POA: Diagnosis not present

## 2015-09-05 DIAGNOSIS — G4733 Obstructive sleep apnea (adult) (pediatric): Secondary | ICD-10-CM | POA: Diagnosis not present

## 2015-09-05 DIAGNOSIS — K912 Postsurgical malabsorption, not elsewhere classified: Secondary | ICD-10-CM | POA: Diagnosis not present

## 2015-09-05 DIAGNOSIS — Z9884 Bariatric surgery status: Secondary | ICD-10-CM | POA: Diagnosis not present

## 2015-09-05 DIAGNOSIS — Z5181 Encounter for therapeutic drug level monitoring: Secondary | ICD-10-CM | POA: Diagnosis not present

## 2015-09-28 ENCOUNTER — Encounter: Payer: Self-pay | Admitting: Physician Assistant

## 2015-09-28 ENCOUNTER — Ambulatory Visit: Payer: Self-pay | Admitting: Physician Assistant

## 2015-09-28 VITALS — BP 100/80 | HR 80 | Temp 98.5°F

## 2015-09-28 DIAGNOSIS — L0390 Cellulitis, unspecified: Principal | ICD-10-CM

## 2015-09-28 DIAGNOSIS — L0291 Cutaneous abscess, unspecified: Secondary | ICD-10-CM

## 2015-09-28 MED ORDER — SULFAMETHOXAZOLE-TRIMETHOPRIM 800-160 MG PO TABS: 1.0000 | ORAL_TABLET | Freq: Two times a day (BID) | ORAL | 0 refills | Status: DC

## 2015-09-28 NOTE — Progress Notes (Addendum)
S: c/o several cystic like pimples on face, one on jaw that is really swollen and tender, no fever/chills, no drainage from site at this time, also eczema acting up  O: vitals wnl, nad, skin on jaw with swollen pimple/abscess, 2 or 3 small ones on face, lungs c t a, cv rrr  A: abscess/acne  P: septra ds 1 po bid, if abscesses return after finishing antibiotic can put on doxy qd  Pt called and would like to try the doxy as her face cleared up but started breaking out again

## 2015-10-18 MED ORDER — DOXYCYCLINE HYCLATE 100 MG PO TABS: 100.0000 mg | ORAL_TABLET | Freq: Every day | ORAL | 6 refills | Status: DC

## 2015-10-18 NOTE — Addendum Note (Signed)
Addended by: Versie Starks on: 10/18/2015 12:05 PM   Modules accepted: Orders

## 2015-11-05 HISTORY — PX: OTHER SURGICAL HISTORY: SHX169

## 2016-06-04 DIAGNOSIS — Z803 Family history of malignant neoplasm of breast: Secondary | ICD-10-CM

## 2016-06-04 HISTORY — DX: Family history of malignant neoplasm of breast: Z80.3

## 2016-06-05 ENCOUNTER — Ambulatory Visit (INDEPENDENT_AMBULATORY_CARE_PROVIDER_SITE_OTHER): Payer: PRIVATE HEALTH INSURANCE | Admitting: Advanced Practice Midwife

## 2016-06-05 ENCOUNTER — Encounter: Payer: Self-pay | Admitting: Advanced Practice Midwife

## 2016-06-05 VITALS — BP 100/60 | HR 80 | Ht 64.0 in | Wt 178.0 lb

## 2016-06-05 DIAGNOSIS — N926 Irregular menstruation, unspecified: Secondary | ICD-10-CM | POA: Diagnosis not present

## 2016-06-05 DIAGNOSIS — Z01419 Encounter for gynecological examination (general) (routine) without abnormal findings: Secondary | ICD-10-CM | POA: Diagnosis not present

## 2016-06-05 DIAGNOSIS — Z124 Encounter for screening for malignant neoplasm of cervix: Secondary | ICD-10-CM

## 2016-06-05 LAB — POCT URINE PREGNANCY: Preg Test, Ur: NEGATIVE

## 2016-06-05 NOTE — Progress Notes (Signed)
Patient ID: Terri Ho, female   DOB: Nov 14, 1985, 31 y.o.   MRN: [id number]     Gynecology Annual Exam  PCP: No PCP Per Patient  Chief Complaint:  Chief Complaint  Patient presents with  . Gynecologic Exam    History of Present Illness: Patient is a 31 y.o. [id number] presents for annual exam. The patient has complaints today of possible BV. She states she has white discharge with odor which is chronic since she has had Paraguard IUD. She wants to keep the Paraguard. She says her period is several days late and she is wondering if she is pregnant.   LMP: Patient's last menstrual period was 05/03/2016. Average Interval: regular, 28 days Duration of flow: 4 days Heavy Menses: no Clots: yes Intermenstrual Bleeding: no Postcoital Bleeding: no Dysmenorrhea: no  The patient is sexually active. She currently uses Paraguard IUD for contraception. She denies dyspareunia.  The patient does occasionally perform self breast exams.  There is notable family history of breast or ovarian cancer in her family. Her mother was diagnosed with breast cancer in her 35's. She is offered genetic testing today and she will consider.  The patient wears seatbelts: yes.   The patient has regular exercise: no but plans to begin regular exercise.  She tries to eat a healthy diet but admits to increased intake of sweets. She admits to adequate hydration.  The patient denies current symptoms of depression.    Review of Systems: Review of Systems  Constitutional: Negative.   HENT: Negative.   Eyes: Negative.   Respiratory: Negative.   Cardiovascular: Negative.   Gastrointestinal: Negative.        Occasional constipation  Genitourinary: Negative.   Musculoskeletal: Negative.   Skin: Negative.   Neurological: Negative.   Endo/Heme/Allergies: Negative.   Psychiatric/Behavioral: Negative.     Past Medical History:  Past Medical History:  Diagnosis Date  . Diabetes mellitus     Past Surgical  History:  Past Surgical History:  Procedure Laterality Date  . BARIATRIC SURGERY      Gynecologic History:  Patient's last menstrual period was 05/03/2016. Contraception: IUD Last Pap: Results were: NIL and HR HPV+   Obstetric History: [id number]  Family History:  Family History  Problem Relation Age of Onset  . Breast cancer Mother     Social History:  Social History   Social History  . Marital status: Married    Spouse name: N/A  . Number of children: N/A  . Years of education: N/A   Occupational History  . Not on file.   Social History Main Topics  . Smoking status: Never Smoker  . Smokeless tobacco: Never Used  . Alcohol use 0.0 oz/week  . Drug use: No  . Sexual activity: Yes    Birth control/ protection: IUD   Other Topics Concern  . Not on file   Social History Narrative  . No narrative on file    Allergies:  No Known Allergies  Medications: Prior to Admission medications   Medication Sig Start Date End Date Taking? Authorizing Provider  levonorgestrel (MIRENA) 20 MCG/24HR IUD 1 each by Intrauterine route once.    Historical Provider, MD    Physical Exam Vitals: Blood pressure 100/60, pulse 80, height 5\' 4"  (1.626 m), weight 178 lb (80.7 kg), last menstrual period 05/03/2016.  General: NAD HEENT: normocephalic, anicteric Thyroid: no enlargement, no palpable nodules Pulmonary: No increased work of breathing, CTAB Cardiovascular: RRR, distal pulses 2+ Breast: Breast symmetrical, no tenderness, no  palpable nodules or masses, no skin or nipple retraction present, no nipple discharge.  No axillary or supraclavicular lymphadenopathy. Abdomen: NABS, soft, non-tender, non-distended.  Umbilicus without lesions.  No hepatomegaly, splenomegaly or masses palpable. No evidence of hernia  Genitourinary:  External: Normal external female genitalia.  Normal urethral meatus, normal  Bartholin's and Skene's glands.    Vagina: Normal vaginal mucosa, no evidence of  prolapse.    Cervix: Grossly normal in appearance, no bleeding, no CMT  Uterus: Non-enlarged, mobile, normal contour.    Adnexa: ovaries non-enlarged, no adnexal masses  Rectal: deferred  Lymphatic: no evidence of inguinal lymphadenopathy Extremities: no edema, erythema, or tenderness Neurologic: Grossly intact Psychiatric: mood appropriate, affect full   Assessment: 31 y.o. [id number] Well woman exam with PAP Plan: Problem List Items Addressed This Visit    None    Visit Diagnoses    Missed period    -  Primary   Relevant Orders   POCT urine pregnancy (Completed)   Well woman exam with routine gynecological exam       Relevant Orders   POCT urine pregnancy (Completed)   IGP,rfx Aptima HPV all pth   Cervical cancer screening       Relevant Orders   IGP,rfx Aptima HPV all pth      1) Increase healthy lifestyle diet and exercise  2) ASCCP guidelines and rational discussed.  Patient opts for yearly screening interval  3) Contraception - has Paraguard IUD inserted March 2016  4) Routine healthcare maintenance including cholesterol, diabetes screening discussed Declines  5) Follow up 1 year for routine annual exam   Rod Can, CNM

## 2016-06-06 LAB — IGP,RFX APTIMA HPV ALL PTH: PAP Smear Comment: 0

## 2016-06-07 ENCOUNTER — Encounter: Payer: Self-pay | Admitting: Obstetrics and Gynecology

## 2016-12-09 ENCOUNTER — Ambulatory Visit: Payer: PRIVATE HEALTH INSURANCE | Admitting: Obstetrics and Gynecology

## 2016-12-09 ENCOUNTER — Other Ambulatory Visit: Payer: Self-pay | Admitting: Obstetrics and Gynecology

## 2016-12-09 ENCOUNTER — Encounter: Payer: Self-pay | Admitting: Obstetrics and Gynecology

## 2016-12-09 VITALS — BP 124/78 | HR 80 | Ht 64.0 in | Wt 185.0 lb

## 2016-12-09 DIAGNOSIS — Z3169 Encounter for other general counseling and advice on procreation: Secondary | ICD-10-CM

## 2016-12-09 DIAGNOSIS — Z30432 Encounter for removal of intrauterine contraceptive device: Secondary | ICD-10-CM

## 2016-12-09 NOTE — Progress Notes (Signed)
   Chief Complaint  Patient presents with  . Contraception    IUD removal      History of Present Illness:  Terri Ho is a 31 y.o. that had a Paragard IUD placed approximately 2.5 years ago. Since that time, she denies dyspareunia, pelvic pain, non-menstrual bleeding, vaginal d/c, heavy bleeding. She would like to conceive. She has not started PNVs.   BP 124/78 (BP Location: Left Arm, Patient Position: Sitting, Cuff Size: Normal)   Pulse 80   Ht 5\' 4"  (1.626 m)   Wt 185 lb (83.9 kg)   BMI 31.76 kg/m   Pelvic exam:  Two IUD strings present seen coming from the cervical os. EGBUS, vaginal vault and cervix: within normal limits  IUD Removal Strings of IUD identified and grasped.  IUD removed without problem with ring forceps.  Pt tolerated this well.  IUD noted to be intact.  Assessment:  Encounter for IUD removal  Pre-conception counseling - Start PNVs.  Plan: IUD removed and plan for contraception is no method. She was amenable to this plan.  Alicia B. Copland, PA-C 12/09/2016 10:29 AM

## 2017-03-28 ENCOUNTER — Ambulatory Visit (INDEPENDENT_AMBULATORY_CARE_PROVIDER_SITE_OTHER): Payer: BLUE CROSS/BLUE SHIELD | Admitting: Maternal Newborn

## 2017-03-28 ENCOUNTER — Encounter: Payer: Self-pay | Admitting: Maternal Newborn

## 2017-03-28 VITALS — BP 110/70 | Wt 190.0 lb

## 2017-03-28 DIAGNOSIS — O0993 Supervision of high risk pregnancy, unspecified, third trimester: Secondary | ICD-10-CM | POA: Insufficient documentation

## 2017-03-28 DIAGNOSIS — E559 Vitamin D deficiency, unspecified: Secondary | ICD-10-CM | POA: Insufficient documentation

## 2017-03-28 DIAGNOSIS — O209 Hemorrhage in early pregnancy, unspecified: Secondary | ICD-10-CM | POA: Insufficient documentation

## 2017-03-28 DIAGNOSIS — O0991 Supervision of high risk pregnancy, unspecified, first trimester: Secondary | ICD-10-CM

## 2017-03-28 DIAGNOSIS — E538 Deficiency of other specified B group vitamins: Secondary | ICD-10-CM | POA: Insufficient documentation

## 2017-03-28 DIAGNOSIS — O24113 Pre-existing diabetes mellitus, type 2, in pregnancy, third trimester: Secondary | ICD-10-CM | POA: Insufficient documentation

## 2017-03-28 DIAGNOSIS — E119 Type 2 diabetes mellitus without complications: Secondary | ICD-10-CM | POA: Insufficient documentation

## 2017-03-28 DIAGNOSIS — G4733 Obstructive sleep apnea (adult) (pediatric): Secondary | ICD-10-CM | POA: Insufficient documentation

## 2017-03-28 DIAGNOSIS — Z0189 Encounter for other specified special examinations: Secondary | ICD-10-CM

## 2017-03-28 DIAGNOSIS — O24111 Pre-existing diabetes mellitus, type 2, in pregnancy, first trimester: Secondary | ICD-10-CM

## 2017-03-28 HISTORY — DX: Supervision of high risk pregnancy, unspecified, first trimester: O09.91

## 2017-03-28 NOTE — Progress Notes (Signed)
C/O Had some spotting 3d ago, stopped now, lasted maybe 2 hrs.rj

## 2017-03-28 NOTE — Patient Instructions (Signed)
First Trimester of Pregnancy The first trimester of pregnancy is from week 1 until the end of week 13 (months 1 through 3). A week after a sperm fertilizes an egg, the egg will implant on the wall of the uterus. This embryo will begin to develop into a baby. Genes from you and your partner will form the baby. The female genes will determine whether the baby will be a boy or a girl. At 6-8 weeks, the eyes and face will be formed, and the heartbeat can be seen on ultrasound. At the end of 12 weeks, all the baby's organs will be formed. Now that you are pregnant, you will want to do everything you can to have a healthy baby. Two of the most important things are to get good prenatal care and to follow your health care provider's instructions. Prenatal care is all the medical care you receive before the baby's birth. This care will help prevent, find, and treat any problems during the pregnancy and childbirth. Body changes during your first trimester Your body goes through many changes during pregnancy. The changes vary from woman to woman.  You may gain or lose a couple of pounds at first.  You may feel sick to your stomach (nauseous) and you may throw up (vomit). If the vomiting is uncontrollable, call your health care provider.  You may tire easily.  You may develop headaches that can be relieved by medicines. All medicines should be approved by your health care provider.  You may urinate more often. Painful urination may mean you have a bladder infection.  You may develop heartburn as a result of your pregnancy.  You may develop constipation because certain hormones are causing the muscles that push stool through your intestines to slow down.  You may develop hemorrhoids or swollen veins (varicose veins).  Your breasts may begin to grow larger and become tender. Your nipples may stick out more, and the tissue that surrounds them (areola) may become darker.  Your gums may bleed and may be  sensitive to brushing and flossing.  Dark spots or blotches (chloasma, mask of pregnancy) may develop on your face. This will likely fade after the baby is born.  Your menstrual periods will stop.  You may have a loss of appetite.  You may develop cravings for certain kinds of food.  You may have changes in your emotions from day to day, such as being excited to be pregnant or being concerned that something may go wrong with the pregnancy and baby.  You may have more vivid and strange dreams.  You may have changes in your hair. These can include thickening of your hair, rapid growth, and changes in texture. Some women also have hair loss during or after pregnancy, or hair that feels dry or thin. Your hair will most likely return to normal after your baby is born.  What to expect at prenatal visits During a routine prenatal visit:  You will be weighed to make sure you and the baby are growing normally.  Your blood pressure will be taken.  Your abdomen will be measured to track your baby's growth.  The fetal heartbeat will be listened to between weeks 10 and 14 of your pregnancy.  Test results from any previous visits will be discussed.  Your health care provider may ask you:  How you are feeling.  If you are feeling the baby move.  If you have had any abnormal symptoms, such as leaking fluid, bleeding, severe headaches,   or abdominal cramping.  If you are using any tobacco products, including cigarettes, chewing tobacco, and electronic cigarettes.  If you have any questions.  Other tests that may be performed during your first trimester include:  Blood tests to find your blood type and to check for the presence of any previous infections. The tests will also be used to check for low iron levels (anemia) and protein on red blood cells (Rh antibodies). Depending on your risk factors, or if you previously had diabetes during pregnancy, you may have tests to check for high blood  sugar that affects pregnant women (gestational diabetes).  Urine tests to check for infections, diabetes, or protein in the urine.  An ultrasound to confirm the proper growth and development of the baby.  Fetal screens for spinal cord problems (spina bifida) and Down syndrome.  HIV (human immunodeficiency virus) testing. Routine prenatal testing includes screening for HIV, unless you choose not to have this test.  You may need other tests to make sure you and the baby are doing well.  Follow these instructions at home: Medicines  Follow your health care provider's instructions regarding medicine use. Specific medicines may be either safe or unsafe to take during pregnancy.  Take a prenatal vitamin that contains at least 600 micrograms (mcg) of folic acid.  If you develop constipation, try taking a stool softener if your health care provider approves. Eating and drinking  Eat a balanced diet that includes fresh fruits and vegetables, whole grains, good sources of protein such as meat, eggs, or tofu, and low-fat dairy. Your health care provider will help you determine the amount of weight gain that is right for you.  Avoid raw meat and uncooked cheese. These carry germs that can cause birth defects in the baby.  Eating four or five small meals rather than three large meals a day may help relieve nausea and vomiting. If you start to feel nauseous, eating a few soda crackers can be helpful. Drinking liquids between meals, instead of during meals, also seems to help ease nausea and vomiting.  Limit foods that are high in fat and processed sugars, such as fried and sweet foods.  To prevent constipation: ? Eat foods that are high in fiber, such as fresh fruits and vegetables, whole grains, and beans. ? Drink enough fluid to keep your urine clear or pale yellow. Activity  Exercise only as directed by your health care provider. Most women can continue their usual exercise routine during  pregnancy. Try to exercise for 30 minutes at least 5 days a week. Exercising will help you: ? Control your weight. ? Stay in shape. ? Be prepared for labor and delivery.  Experiencing pain or cramping in the lower abdomen or lower back is a good sign that you should stop exercising. Check with your health care provider before continuing with normal exercises.  Try to avoid standing for long periods of time. Move your legs often if you must stand in one place for a long time.  Avoid heavy lifting.  Wear low-heeled shoes and practice good posture.  You may continue to have sex unless your health care provider tells you not to. Relieving pain and discomfort  Wear a good support bra to relieve breast tenderness.  Take warm sitz baths to soothe any pain or discomfort caused by hemorrhoids. Use hemorrhoid cream if your health care provider approves.  Rest with your legs elevated if you have leg cramps or low back pain.  If you develop   varicose veins in your legs, wear support hose. Elevate your feet for 15 minutes, 3-4 times a day. Limit salt in your diet. Prenatal care  Schedule your prenatal visits by the twelfth week of pregnancy. They are usually scheduled monthly at first, then more often in the last 2 months before delivery.  Write down your questions. Take them to your prenatal visits.  Keep all your prenatal visits as told by your health care provider. This is important. Safety  Wear your seat belt at all times when driving.  Make a list of emergency phone numbers, including numbers for family, friends, the hospital, and police and fire departments. General instructions  Ask your health care provider for a referral to a local prenatal education class. Begin classes no later than the beginning of month 6 of your pregnancy.  Ask for help if you have counseling or nutritional needs during pregnancy. Your health care provider can offer advice or refer you to specialists for help  with various needs.  Do not use hot tubs, steam rooms, or saunas.  Do not douche or use tampons or scented sanitary pads.  Do not cross your legs for long periods of time.  Avoid cat litter boxes and soil used by cats. These carry germs that can cause birth defects in the baby and possibly loss of the fetus by miscarriage or stillbirth.  Avoid all smoking, herbs, alcohol, and medicines not prescribed by your health care provider. Chemicals in these products affect the formation and growth of the baby.  Do not use any products that contain nicotine or tobacco, such as cigarettes and e-cigarettes. If you need help quitting, ask your health care provider. You may receive counseling support and other resources to help you quit.  Schedule a dentist appointment. At home, brush your teeth with a soft toothbrush and be gentle when you floss. Contact a health care provider if:  You have dizziness.  You have mild pelvic cramps, pelvic pressure, or nagging pain in the abdominal area.  You have persistent nausea, vomiting, or diarrhea.  You have a bad smelling vaginal discharge.  You have pain when you urinate.  You notice increased swelling in your face, hands, legs, or ankles.  You are exposed to fifth disease or chickenpox.  You are exposed to German measles (rubella) and have never had it. Get help right away if:  You have a fever.  You are leaking fluid from your vagina.  You have spotting or bleeding from your vagina.  You have severe abdominal cramping or pain.  You have rapid weight gain or loss.  You vomit blood or material that looks like coffee grounds.  You develop a severe headache.  You have shortness of breath.  You have any kind of trauma, such as from a fall or a car accident. Summary  The first trimester of pregnancy is from week 1 until the end of week 13 (months 1 through 3).  Your body goes through many changes during pregnancy. The changes vary from  woman to woman.  You will have routine prenatal visits. During those visits, your health care provider will examine you, discuss any test results you may have, and talk with you about how you are feeling. This information is not intended to replace advice given to you by your health care provider. Make sure you discuss any questions you have with your health care provider. Document Released: 01/15/2001 Document Revised: 01/03/2016 Document Reviewed: 01/03/2016 Elsevier Interactive Patient Education  2018 Elsevier   Inc.  

## 2017-03-28 NOTE — Progress Notes (Addendum)
03/28/2017   Chief Complaint: Amenorrhea, positive home pregnancy test, desires prenatal care.  Transfer of Care Patient: no  History of Present Illness: Terri Ho is a 32 y.o. [id number] [redacted]w[redacted]d based on Patient's last menstrual period on 02/16/2017 (approximate), with an Estimated Date of Delivery: 11/23/17, with the above CC.   Her periods were: regular periods every 21-28 days, lasting 3 days. She was using no method when she conceived.  She has Negative signs or symptoms of nausea/vomiting of pregnancy. She has Positive signs or symptoms of miscarriage or preterm labor (small amount of bright red bleeding, no cramping, has stopped today). She identifies Negative Zika risk factors for her and her partner On any different medications around the time she conceived/early pregnancy: No  History of varicella: Yes   ROS: A 12-point review of systems was performed and negative, except as stated in the above HPI.  OBGYN History: As per HPI. OB History  Gravida Para Term Preterm AB Living  5 1 0 1 3 1   SAB TAB Ectopic Multiple Live Births  1 2          # Outcome Date GA Lbr Len/2nd Weight Sex Delivery Anes PTL Lv  5 Current           4 Preterm 10/28/12 [redacted]w[redacted]d  7 lb 9.6 oz (3.447 kg) M CS-Unspec     3 SAB           2 TAB           1 TAB               Any issues with any prior pregnancies: yes, SAB with G3, G4 poorly controlled diabetes. Note that flowsheet has GA of [redacted] weeks but prior records in [place] indicate labor induction at [redacted]w[redacted]d for BPP 2/8. Any prior children are healthy, doing well, without any problems or issues: yes History of pap smears: Yes. Last pap smear 06/05/2016. NILM History of STIs: Yes, remote history of trichomoniasis 6-7 years ago.   Past Medical History: Past Medical History:  Diagnosis Date  . Diabetes mellitus 04/06/2012   TYPE 2; NON INSULIN DEP   . Family history of breast cancer 06/2016   cancer genetic testing discussed, pt considering  . Vitamin D  deficiency     Past Surgical History: Past Surgical History:  Procedure Laterality Date  . BARIATRIC SURGERY  01/2014  . CESAREAN SECTION  10/28/2012  . TUMMY TUCK  11/2014    Family History:  Family History  Problem Relation Age of Onset  . Breast cancer Mother 34       again at 33  . Hypertension Mother   . Congestive Heart Failure Father   . COPD Father   . Diabetes Father        TYPE 2   She denies any female cancers, bleeding or blood clotting disorders.  She denies any history of intellectual disability, birth defects or genetic disorders in her or the FOB's history.  Social History:  Social History   Socioeconomic History  . Marital status: Married    Spouse name: Not on file  . Number of children: 1  . Years of education: 54  . Highest education level: Not on file  Social Needs  . Financial resource strain: Not on file  . Food insecurity - worry: Not on file  . Food insecurity - inability: Not on file  . Transportation needs - medical: Not on file  . Transportation needs - non-medical: Not  on file  Occupational History  . Not on file  Tobacco Use  . Smoking status: Never Smoker  . Smokeless tobacco: Never Used  Substance and Sexual Activity  . Alcohol use: Yes    Alcohol/week: 0.0 oz  . Drug use: No  . Sexual activity: Yes    Birth control/protection: IUD  Other Topics Concern  . Not on file  Social History Narrative  . Not on file   Any cats in the household: no   Allergy: No Known Allergies  Current Outpatient Medications: No current outpatient medications on file.   Physical Exam:   BP 110/70   Wt 190 lb (86.2 kg)   LMP 02/16/2017 (Approximate)   BMI 32.61 kg/m  Body mass index is 32.61 kg/m. Constitutional: Well nourished, well developed female in no acute distress.  Neck:  Supple, normal appearance, and no thyromegaly  Cardiovascular: S1, S2 normal, no murmur, rub or gallop, regular rate and rhythm Respiratory:  Clear to  auscultation bilaterally. Normal respiratory effort Abdomen: no masses, hernias; diffusely non tender to palpation, non distended, some scarring from prior abdominal surgeries Breasts: breasts appear normal, no suspicious masses, no skin or nipple changes or axillary nodes, risk and benefit of breast self-exam was discussed. Neuro/Psych:  Normal mood and affect.  Skin:  Warm and dry.  Lymphatic:  No inguinal lymphadenopathy.   Pelvic exam: is not limited by body habitus External genitalia, Bartholin's glands, Urethra, Skene's glands: within normal limits Vagina: within normal limits and with no blood in the vault  Cervix: normal appearing cervix without discharge or lesions, closed/long/high Uterus:  enlarged, c/w early pregnancy Adnexa:  no mass, fullness, tenderness  Assessment: Terri Ho is a 32 y.o. [id number] [redacted]w[redacted]d based on Patient's last menstrual period on 02/16/2017 (approximate), with an Estimated Date of Delivery: 11/23/17, presenting for prenatal care.  Plan:  1) Avoid alcoholic beverages. 2) Patient encouraged not to smoke.  3) Discontinue the use of all non-medicinal drugs and chemicals.  4) Take prenatal vitamins daily.  5) Seatbelt use advised 6) Nutrition, food safety (fish, cheese advisories, and high nitrite foods) and exercise discussed. 7) Hospital and practice style delivering at [facility] discussed  8) Patient is asked about travel to areas at risk for the [place] virus, and counseled to avoid travel and exposure to mosquitoes or sexual partners who may have themselves been exposed to the virus. Testing is discussed, and will be ordered as appropriate.  9) Childbirth classes at [facility] advised 10) Genetic Screening, such as with 1st Trimester Screening, cell free fetal DNA, AFP testing, and Ultrasound, as well as with amniocentesis and CVS as appropriate, is discussed with patient. She plans to have genetic testing this pregnancy. 11) Referral to [facility] for DM II. Not currently  checking BG or taking any medications. 12) Had some bleeding on 2/20 and 2/21, bright red when she wiped. None so far today, requested beta HCG for reassurance as she has had prior miscarriage. 54) Discussed genetic screening as patient's mother had breast cancer before age 34. She declines at this time but will consider postpartum.  14) Dating and viability ultrasound and ROB in 2 weeks.  Problem list reviewed and updated.  Avel Sensor, CNM [facility] 03/28/2017  2:23 PM

## 2017-03-30 LAB — URINE DRUG PANEL 7
Amphetamines, Urine: NEGATIVE ng/mL
BARBITURATE QUANT UR: NEGATIVE ng/mL
Benzodiazepine Quant, Ur: NEGATIVE ng/mL
COCAINE (METAB.): NEGATIVE ng/mL
Cannabinoid Quant, Ur: NEGATIVE ng/mL
OPIATE QUANT UR: NEGATIVE ng/mL
PCP Quant, Ur: NEGATIVE ng/mL

## 2017-03-30 LAB — GC/CHLAMYDIA PROBE AMP
Chlamydia trachomatis, NAA: NEGATIVE
Neisseria gonorrhoeae by PCR: NEGATIVE

## 2017-03-30 LAB — URINE CULTURE

## 2017-03-31 ENCOUNTER — Other Ambulatory Visit: Payer: BLUE CROSS/BLUE SHIELD

## 2017-03-31 DIAGNOSIS — O209 Hemorrhage in early pregnancy, unspecified: Secondary | ICD-10-CM

## 2017-03-31 LAB — RPR+RH+ABO+RUB AB+AB SCR+CB...
ANTIBODY SCREEN: NEGATIVE
HEMATOCRIT: 40.1 % (ref 34.0–46.6)
HIV Screen 4th Generation wRfx: NONREACTIVE
Hemoglobin: 13.4 g/dL (ref 11.1–15.9)
Hepatitis B Surface Ag: NEGATIVE
MCH: 30.4 pg (ref 26.6–33.0)
MCHC: 33.4 g/dL (ref 31.5–35.7)
MCV: 91 fL (ref 79–97)
PLATELETS: 285 10*3/uL (ref 150–379)
RBC: 4.41 x10E6/uL (ref 3.77–5.28)
RDW: 14 % (ref 12.3–15.4)
RH TYPE: POSITIVE
RPR Ser Ql: NONREACTIVE
Rubella Antibodies, IGG: 3.62 index (ref >0.99)
Varicella zoster IgG: 1496 index (ref >165)
WBC: 5.6 10*3/uL (ref 3.4–10.8)

## 2017-03-31 LAB — HEMOGLOBINOPATHY EVALUATION
HEMOGLOBIN A2 QUANTITATION: 2.2 % (ref 1.8–3.2)
HEMOGLOBIN F QUANTITATION: 0 % (ref 0.0–2.0)
HGB A: 97.8 % (ref 96.4–98.8)
HGB C: 0 %
HGB S: 0 %
HGB VARIANT: 0 %

## 2017-03-31 LAB — BETA HCG QUANT (REF LAB): hCG Quant: 18612 m[IU]/mL

## 2017-04-01 LAB — BETA HCG QUANT (REF LAB): HCG QUANT: 29427 m[IU]/mL

## 2017-04-04 ENCOUNTER — Other Ambulatory Visit: Payer: Self-pay | Admitting: Maternal Newborn

## 2017-04-04 DIAGNOSIS — O0991 Supervision of high risk pregnancy, unspecified, first trimester: Secondary | ICD-10-CM

## 2017-04-08 ENCOUNTER — Encounter: Payer: BLUE CROSS/BLUE SHIELD | Attending: Maternal Newborn | Admitting: Dietician

## 2017-04-08 ENCOUNTER — Encounter: Payer: Self-pay | Admitting: Dietician

## 2017-04-08 VITALS — BP 124/72 | Ht 63.0 in | Wt 191.6 lb

## 2017-04-08 DIAGNOSIS — O0991 Supervision of high risk pregnancy, unspecified, first trimester: Secondary | ICD-10-CM | POA: Diagnosis not present

## 2017-04-08 DIAGNOSIS — Z713 Dietary counseling and surveillance: Secondary | ICD-10-CM | POA: Insufficient documentation

## 2017-04-08 DIAGNOSIS — O24119 Pre-existing diabetes mellitus, type 2, in pregnancy, unspecified trimester: Secondary | ICD-10-CM

## 2017-04-08 DIAGNOSIS — Z3A00 Weeks of gestation of pregnancy not specified: Secondary | ICD-10-CM | POA: Diagnosis not present

## 2017-04-08 DIAGNOSIS — O24111 Pre-existing diabetes mellitus, type 2, in pregnancy, first trimester: Secondary | ICD-10-CM | POA: Insufficient documentation

## 2017-04-08 DIAGNOSIS — E119 Type 2 diabetes mellitus without complications: Secondary | ICD-10-CM | POA: Insufficient documentation

## 2017-04-08 DIAGNOSIS — O99841 Bariatric surgery status complicating pregnancy, first trimester: Secondary | ICD-10-CM | POA: Insufficient documentation

## 2017-04-08 NOTE — Patient Instructions (Addendum)
Read booklet on  Diabetes and Pregnancy Follow  Meal Planning Guidelines for Pregnancy Drink 6-8 (8 oz) glasses of water/day Make healthy food choices Limit intake of sweets and fried foods Eat 3 meals/day and 2-3 snacks spaced 2-3 hours apart Complete a 3 Day Food Record and bring to next appointment Check blood sugars 4 x day - before breakfast and 2 hrs after every meal and record  Bring blood sugar log to all appointments Call MD for prescription for meter strips and lancets Strips:  Contour Next test strips  Lancets: Microlet lancets Walk 20-30 minutes at least 5 x week if permitted by MD Next appointment:  04-14-17

## 2017-04-08 NOTE — Progress Notes (Signed)
Diabetes Self-Management Education  Visit Type: First/Initial  Appt. Start Time: 0900 Appt. End Time: 1030  04/08/2017  Terri Ho, identified by name and date of birth, is a 32 y.o. female with a diagnosis of Diabetes: Type 2.   ASSESSMENT  Blood pressure 124/72, height 5\' 3"  (1.6 m), weight 191 lb 9.6 oz (86.9 kg), last menstrual period 02/16/2017. Body mass index is 33.94 kg/m.  Diabetes Self-Management Education - 04/08/17 1030      Visit Information   Visit Type  First/Initial      Initial Visit   Diabetes Type  Type 2      Health Coping   How would you rate your overall health?  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  Needs Review    Patient undertands incorporating physical activity into lifestyle.  Needs Review    Patient understands using medications safely.  Needs Review    Patient understands monitoring blood glucose, interpreting and using results  Needs Review    Patient understands prevention, detection, and treatment of acute complications.  Needs Review    Patient understands prevention, detection, and treatment of chronic complications.  Needs Review    Patient understands how to develop strategies to address psychosocial issues.  Needs Review     Patient understands how to develop strategies to promote health/change behavior.  Needs Review      Complications   How often do you check your blood sugar?  0 times/day (not testing)    Have you had a dilated eye exam in the past 12 months?  Yes 01-2017    Have you had a dental exam in the past 12 months?  Yes 02-2017    Are you checking your feet?  Yes    How many days per week are you checking your feet?  7      Dietary Intake   Breakfast  breakfast at 9a-eats fast food 3 days/wk (breakfast sandwich, hashbrowns); when eats at home, has eggs, sausage, biscuit    Snack (morning)  none    Lunch  eats lunch at 1p=eats foods brought in by drug reps at work 5x/wk (salads, chicken and vegetables, spaghetti)    Snack (afternoon)  none    Dinner  eats supper at 6p-eats out (fast foods) 5x/wk    Snack (evening)  eats at 9p=chips      Exercise   Exercise Type  Light (walking / raking leaves) treadmill    How many days per week to you exercise?  2    How many minutes per day do you exercise?  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    Physical activity and exercise   Role of exercise on diabetes management, blood pressure control and cardiac health.;Helped patient identify appropriate exercises in relation to his/her diabetes, diabetes complications and other health issue.    Medications  -- discussed medications used to treat diabetes during pregnancy    Monitoring  Taught/evaluated SMBG  meter.;Purpose and frequency of SMBG.;Taught/discussed recording of test results and interpretation of SMBG.;Identified appropriate SMBG and/or A1C goals. gave pt Contour Next EZ meter and instructed on its use-BG 110 (2 hr pp-ate a brownie for breakfast)    Chronic complications  Relationship between chronic complications and blood glucose control discussed how pregnancy can cause development of complications    Preconception care  Reviewed with patient blood glucose goals with pregnancy;Role of family planning for patients with diabetes;Pregnancy and GDM  Role of pre-pregnancy blood glucose control on the development of the fetus    Personal strategies to promote health  Lifestyle issues that need to be addressed for better diabetes care;Helped patient develop diabetes management plan for (enter comment)      Outcomes   Expected Outcomes  Demonstrated interest in learning. Expect positive outcomes       Individualized Plan for Diabetes Self-Management Training:   Learning Objective:  Patient will have a greater understanding of diabetes self-management. Patient education plan is to attend individual and/or group sessions per assessed needs and concerns.   Plan:   Patient Instructions  Read booklet on  Diabetes and Pregnancy Follow  Meal Planning Guidelines for Pregnancy Drink 6-8 (8 oz) glasses of water/day Make healthy food choices Limit intake of sweets and fried foods Eat 3 meals/day and 2-3 snacks spaced 2-3 hours apart Complete a 3 Day Food Record and bring to next appointment Check blood sugars 4 x day - before breakfast and 2 hrs after every meal and record  Bring blood sugar log to all appointments Call MD for prescription for meter strips and lancets Strips:  Contour Next test strips  Lancets: Microlet lancets Walk 20-30 minutes at least 5 x week if permitted by MD Next appointment:  04-14-17   Expected Outcomes:  Demonstrated interest in learning. Expect positive  outcomes  Education material provided:  Meal Guidelines for Pregnancy, "Diabetes Management for Mothers To Be" booklet  If problems or questions, patient to contact team via:  [phone]  Future DSME appointment:  04-14-17

## 2017-04-11 ENCOUNTER — Encounter: Payer: Self-pay | Admitting: Obstetrics and Gynecology

## 2017-04-11 ENCOUNTER — Ambulatory Visit (INDEPENDENT_AMBULATORY_CARE_PROVIDER_SITE_OTHER): Payer: BLUE CROSS/BLUE SHIELD | Admitting: Obstetrics and Gynecology

## 2017-04-11 ENCOUNTER — Other Ambulatory Visit: Payer: Self-pay | Admitting: Maternal Newborn

## 2017-04-11 ENCOUNTER — Ambulatory Visit (INDEPENDENT_AMBULATORY_CARE_PROVIDER_SITE_OTHER): Payer: BLUE CROSS/BLUE SHIELD

## 2017-04-11 VITALS — BP 116/74 | Wt 194.0 lb

## 2017-04-11 DIAGNOSIS — Z6831 Body mass index (BMI) 31.0-31.9, adult: Secondary | ICD-10-CM

## 2017-04-11 DIAGNOSIS — E669 Obesity, unspecified: Secondary | ICD-10-CM | POA: Insufficient documentation

## 2017-04-11 DIAGNOSIS — O99211 Obesity complicating pregnancy, first trimester: Secondary | ICD-10-CM

## 2017-04-11 DIAGNOSIS — O209 Hemorrhage in early pregnancy, unspecified: Secondary | ICD-10-CM

## 2017-04-11 DIAGNOSIS — O24111 Pre-existing diabetes mellitus, type 2, in pregnancy, first trimester: Secondary | ICD-10-CM

## 2017-04-11 DIAGNOSIS — Z0189 Encounter for other specified special examinations: Secondary | ICD-10-CM

## 2017-04-11 DIAGNOSIS — O34219 Maternal care for unspecified type scar from previous cesarean delivery: Secondary | ICD-10-CM

## 2017-04-11 DIAGNOSIS — O0991 Supervision of high risk pregnancy, unspecified, first trimester: Secondary | ICD-10-CM

## 2017-04-11 DIAGNOSIS — O99210 Obesity complicating pregnancy, unspecified trimester: Secondary | ICD-10-CM | POA: Insufficient documentation

## 2017-04-11 DIAGNOSIS — E119 Type 2 diabetes mellitus without complications: Secondary | ICD-10-CM

## 2017-04-11 NOTE — Progress Notes (Signed)
Routine Prenatal Care Visit  Subjective  Terri Ho is a 32 y.o. G5P1031 at [redacted]w[redacted]d being seen today for ongoing prenatal care.  She is currently monitored for the following issues for this high-risk pregnancy and has Obesity, unspecified; Obstructive sleep apnea of adult; Type 2 diabetes mellitus ([place]); Vitamin B12 deficiency (non anemic); Vitamin D deficiency; Supervision of high risk pregnancy, antepartum, first trimester; Pre-existing type 2 diabetes mellitus during pregnancy in first trimester; Antepartum bleeding, first trimester; Obesity affecting pregnancy; BMI 31.0-31.9,adult; and History of cesarean delivery affecting pregnancy on their problem list.  ----------------------------------------------------------------------------------- Patient reports no complaints.    . Vag. Bleeding: None.   . Denies leaking of fluid.  Patient did not bring BG log. She states her values have been slightly elevated. Has another appointment on 3/11. U/S today shows EDD 11/20/17. She had an unsure LMP. So, will use U/S based EDD. ----------------------------------------------------------------------------------- The following portions of the patient's history were reviewed and updated as appropriate: allergies, current medications, past family history, past medical history, past social history, past surgical history and problem list. Problem list updated.   Objective  Blood pressure 116/74, weight 194 lb (88 kg), last menstrual period 02/16/2017. Pregravid weight 178 lb (80.7 kg) Total Weight Gain 16 lb (7.258 kg) Urinalysis: Urine Protein: Negative Urine Glucose: Negative  Fetal Status:           General:  Alert, oriented and cooperative. Patient is in no acute distress.  Skin: Skin is warm and dry. No rash noted.   Cardiovascular: Normal heart rate noted  Respiratory: Normal respiratory effort, no problems with respiration noted  Abdomen: Soft, gravid, appropriate for gestational age.  Pain/Pressure: Absent     Pelvic:  Cervical exam deferred        Extremities: Normal range of motion.     Mental Status: Normal mood and affect. Normal behavior. Normal judgment and thought content.   Assessment   32 y.o. [id number] at [redacted]w[redacted]d by  11/20/2017, by Ultrasound presenting for routine prenatal visit  Plan   FIFTH Problems (from 03/28/17 to present)    Problem Noted Resolved   Obesity affecting pregnancy 04/11/2017 by Will Bonnet, MD No   BMI 31.0-31.9,adult 04/11/2017 by Will Bonnet, MD No   History of cesarean delivery affecting pregnancy 04/11/2017 by Will Bonnet, MD No   Supervision of high risk pregnancy, antepartum, first trimester 03/28/2017 by Rexene Agent, CNM No   Overview Addendum 04/04/2017 10:01 AM by Rexene Agent, [place] Prenatal Labs  Dating  Blood type: O/Positive/-- (02/22 1504)   Genetic Screen 1 Screen:    AFP:     Quad:     NIPS: Antibody:Negative (02/22 1504)  Anatomic [place]  Rubella: 3.62 (02/22 1504) Varicella: Immune  GTT Early:               Third trimester:  RPR: Non Reactive (02/22 1504)   Rhogam  HBsAg: Negative (02/22 1504)   TDaP vaccine                       Flu Shot: HIV: Non Reactive (02/22 1504)   Baby Food                                GBS:   Contraception  Pap: 06/05/2016, NILM  CBB     CS/VBAC    Support Person  Please refer to After Visit Summary for other counseling recommendations.   Return in about 1 week (around 04/18/2017) for Routine Prenatal Appointment and blood glucose check.  - encouraged her to bring BG log. Discussed that good glucose control is important to good outcome throughout pregnancy.  - encouraged her to keep appointment on 3/11.   Prentice Docker, MD, [facility], [facility] 04/11/2017 10:23 PM

## 2017-04-14 ENCOUNTER — Ambulatory Visit: Payer: Self-pay | Admitting: Dietician

## 2017-04-14 ENCOUNTER — Encounter: Payer: Self-pay | Admitting: Dietician

## 2017-04-14 NOTE — Progress Notes (Signed)
Patient cancelled her appointment for today due to work schedule. A message has been left for her to call back to reschedule.

## 2017-04-16 ENCOUNTER — Ambulatory Visit (INDEPENDENT_AMBULATORY_CARE_PROVIDER_SITE_OTHER): Payer: BLUE CROSS/BLUE SHIELD | Admitting: Advanced Practice Midwife

## 2017-04-16 ENCOUNTER — Other Ambulatory Visit (INDEPENDENT_AMBULATORY_CARE_PROVIDER_SITE_OTHER): Payer: BLUE CROSS/BLUE SHIELD

## 2017-04-16 ENCOUNTER — Telehealth: Payer: Self-pay | Admitting: Advanced Practice Midwife

## 2017-04-16 ENCOUNTER — Encounter: Payer: Self-pay | Admitting: Advanced Practice Midwife

## 2017-04-16 VITALS — BP 112/60 | Wt 194.0 lb

## 2017-04-16 DIAGNOSIS — O26851 Spotting complicating pregnancy, first trimester: Secondary | ICD-10-CM | POA: Diagnosis not present

## 2017-04-16 DIAGNOSIS — O24111 Pre-existing diabetes mellitus, type 2, in pregnancy, first trimester: Secondary | ICD-10-CM

## 2017-04-16 DIAGNOSIS — Z3A08 8 weeks gestation of pregnancy: Secondary | ICD-10-CM

## 2017-04-16 NOTE — Patient Instructions (Addendum)
Prenatal Care WHAT IS PRENATAL CARE? Prenatal care is the process of caring for a pregnant woman before she gives birth. Prenatal care makes sure that she and her baby remain as healthy as possible throughout pregnancy. Prenatal care may be provided by a midwife, family practice health care provider, or a childbirth and pregnancy specialist (obstetrician). Prenatal care may include physical examinations, testing, treatments, and education on nutrition, lifestyle, and social support services. WHY IS PRENATAL CARE SO IMPORTANT? Early and consistent prenatal care increases the chance that you and your baby will remain healthy throughout your pregnancy. This type of care also decreases a baby's risk of being born too early (prematurely), or being born smaller than expected (small for gestational age). Any underlying medical conditions you may have that could pose a risk during your pregnancy are discussed during prenatal care visits. You will also be monitored regularly for any new conditions that may arise during your pregnancy so they can be treated quickly and effectively. WHAT HAPPENS DURING PRENATAL CARE VISITS? Prenatal care visits may include the following: Discussion Tell your health care provider about any new signs or symptoms you have experienced since your last visit. These might include:  Nausea or vomiting.  Increased or decreased level of energy.  Difficulty sleeping.  Back or leg pain.  Weight changes.  Frequent urination.  Shortness of breath with physical activity.  Changes in your skin, such as the development of a rash or itchiness.  Vaginal discharge or bleeding.  Feelings of excitement or nervousness.  Changes in your baby's movements.  You may want to write down any questions or topics you want to discuss with your health care provider and bring them with you to your appointment. Examination During your first prenatal care visit, you will likely have a complete  physical exam. Your health care provider will often examine your vagina, cervix, and the position of your uterus, as well as check your heart, lungs, and other body systems. As your pregnancy progresses, your health care provider will measure the size of your uterus and your baby's position inside your uterus. He or she may also examine you for early signs of labor. Your prenatal visits may also include checking your blood pressure and, after about 10-12 weeks of pregnancy, listening to your baby's heartbeat. Testing Regular testing often includes:  Urinalysis. This checks your urine for glucose, protein, or signs of infection.  Blood count. This checks the levels of white and red blood cells in your body.  Tests for sexually transmitted infections (STIs). Testing for STIs at the beginning of pregnancy is routinely done and is required in many states.  Antibody testing. You will be checked to see if you are immune to certain illnesses, such as rubella, that can affect a developing fetus.  Glucose screen. Around 24-28 weeks of pregnancy, your blood glucose level will be checked for signs of gestational diabetes. Follow-up tests may be recommended.  Group B strep. This is a bacteria that is commonly found inside a woman's vagina. This test will inform your health care provider if you need an antibiotic to reduce the amount of this bacteria in your body prior to labor and childbirth.  Ultrasound. Many pregnant women undergo an ultrasound screening around 18-20 weeks of pregnancy to evaluate the health of the fetus and check for any developmental abnormalities.  HIV (human immunodeficiency virus) testing. Early in your pregnancy, you will be screened for HIV. If you are at high risk for HIV, this test may   be repeated during your third trimester of pregnancy.  You may be offered other testing based on your age, personal or family medical history, or other factors. HOW OFTEN SHOULD I PLAN TO SEE MY  HEALTH CARE PROVIDER FOR PRENATAL CARE? Your prenatal care check-up schedule depends on any medical conditions you have before, or develop during, your pregnancy. If you do not have any underlying medical conditions, you will likely be seen for checkups:  Monthly, during the first 6 months of pregnancy.  Twice a month during months 7 and 8 of pregnancy.  Weekly starting in the 9th month of pregnancy and until delivery.  If you develop signs of early labor or other concerning signs or symptoms, you may need to see your health care provider more often. Ask your health care provider what prenatal care schedule is best for you. WHAT CAN I DO TO KEEP MYSELF AND MY BABY AS HEALTHY AS POSSIBLE DURING MY PREGNANCY?  Take a prenatal vitamin containing 400 micrograms (0.4 mg) of folic acid every day. Your health care provider may also ask you to take additional vitamins such as iodine, vitamin D, iron, copper, and zinc.  Take 1500-2000 mg of calcium daily starting at your 20th week of pregnancy until you deliver your baby.  Make sure you are up to date on your vaccinations. Unless directed otherwise by your health care provider: ? You should receive a tetanus, diphtheria, and pertussis (Tdap) vaccination between the 27th and 36th week of your pregnancy, regardless of when your last Tdap immunization occurred. This helps protect your baby from whooping cough (pertussis) after he or she is born. ? You should receive an annual inactivated influenza vaccine (IIV) to help protect you and your baby from influenza. This can be done at any point during your pregnancy.  Eat a well-rounded diet that includes: ? Fresh fruits and vegetables. ? Lean proteins. ? Calcium-rich foods such as milk, yogurt, hard cheeses, and dark, leafy greens. ? Whole grain breads.  Do noteat seafood high in mercury, including: ? Swordfish. ? Tilefish. ? Shark. ? King mackerel. ? More than 6 oz tuna per week.  Do not  eat: ? Raw or undercooked meats or eggs. ? Unpasteurized foods, such as soft cheeses (brie, blue, or feta), juices, and milks. ? Lunch meats. ? Hot dogs that have not been heated until they are steaming.  Drink enough water to keep your urine clear or pale yellow. For many women, this may be 10 or more 8 oz glasses of water each day. Keeping yourself hydrated helps deliver nutrients to your baby and may prevent the start of pre-term uterine contractions.  Do not use any tobacco products including cigarettes, chewing tobacco, or electronic cigarettes. If you need help quitting, ask your health care provider.  Do not drink beverages containing alcohol. No safe level of alcohol consumption during pregnancy has been determined.  Do not use any illegal drugs. These can harm your developing baby or cause a miscarriage.  Ask your health care provider or pharmacist before taking any prescription or over-the-counter medicines, herbs, or supplements.  Limit your caffeine intake to no more than 200 mg per day.  Exercise. Unless told otherwise by your health care provider, try to get 30 minutes of moderate exercise most days of the week. Do not  do high-impact activities, contact sports, or activities with a high risk of falling, such as horseback riding or downhill skiing.  Get plenty of rest.  Avoid anything that raises your  body temperature, such as hot tubs and saunas.  If you own a cat, do not empty its litter box. Bacteria contained in cat feces can cause an infection called toxoplasmosis. This can result in serious harm to the fetus.  Stay away from chemicals such as insecticides, lead, mercury, and cleaning or paint products that contain solvents.  Do not have any X-rays taken unless medically necessary.  Take a childbirth and breastfeeding preparation class. Ask your health care provider if you need a referral or recommendation.  This information is not intended to replace advice given  to you by your health care provider. Make sure you discuss any questions you have with your health care provider. Document Released: 01/24/2003 Document Revised: 06/26/2015 Document Reviewed: 04/07/2013 Elsevier Interactive Patient Education  2017 [employer] Exercise During Pregnancy For people of all ages, exercise is an important part of being healthy. Exercise improves heart and lung function and helps to maintain strength, flexibility, and a healthy body weight. Exercise also boosts energy levels and elevates mood. For most women, maintaining an exercise routine throughout pregnancy is recommended. It is only on rare occasions and with certain medical conditions or pregnancy complications that women may be asked to limit or avoid exercise during pregnancy. What are some other benefits to exercising during pregnancy? Along with maintaining strength and flexibility, exercising throughout pregnancy can help to:  Keep strength in muscles that are very important during labor and childbirth.  Decrease low back pain during pregnancy.  Decrease the risk of developing gestational diabetes mellitus (GDM).  Improve blood sugar (glucose) control for women who have GDM.  Decrease the risk of developing preeclampsia. This is a serious condition that causes high blood pressure along with other symptoms, such as swelling and headaches.  Decrease the risk of cesarean delivery.  Speed up the recovery after giving birth.  How often should I exercise? Unless your health care provider gives you different instructions, you should try to exercise on most days or all days of the week. In general, try to exercise with moderate intensity for about 150 minutes per week. This can be spread out across several days, such as exercising for 30 minutes per day on 5 days of each week. You can tell that you are exercising at a moderate intensity if you have a higher heart rate and faster breathing, but you are still  able to hold a conversation. What types of moderate-intensity exercise are recommended during pregnancy? There are many types of exercise that are safe for you to do during pregnancy. Unless your health care provider gives you different instructions, do a variety of exercises that safely increase your heart and breathing (cardiopulmonary) rates and help you to build and maintain muscle strength (strength training). You should always be able to talk in full sentences while exercising during pregnancy. Some examples of exercising that is safe to do during pregnancy include:  Brisk walking or hiking.  Swimming.  Water aerobics.  Riding a stationary bike.  Strength training.  Modified yoga or Pilates. Tell your instructor that you are pregnant. Avoid overstretching and avoid lying on your back for long periods of time.  Running or jogging. Only choose this type of exercise if: ? You ran or jogged regularly before your pregnancy. ? You can run or jog and still talk in complete sentences.  What types of exercise should I not do during pregnancy? Depending on your level of fitness and whether you exercised regularly before your pregnancy, you may be   advised to limit vigorous-intensity exercise during your pregnancy. You can tell that you are exercising at a vigorous intensity if you are breathing much harder and faster and cannot hold a conversation while exercising. Some examples of exercising that you should avoid during pregnancy include:  Contact sports.  Activities that place you at risk for falling on or being hit in the belly, such as downhill skiing, water skiing, surfing, rock climbing, cycling, gymnastics, and horseback riding.  Scuba diving.  Sky diving.  Yoga or Pilates in a room that is heated to extreme temperatures ("hot yoga" or "hot Pilates").  Jogging or running, unless you ran or jogged regularly before your pregnancy. While jogging or running, you should always be able  to talk in full sentences. Do not run or jog so vigorously that you are unable to have a conversation.  If you are not used to exercising at elevation (more than 6,000 feet above sea level), do not do so during your pregnancy.  When should I avoid exercising during pregnancy? Certain medical conditions can make it unsafe to exercise during pregnancy, or they may increase your risk of miscarriage or early labor and birth. Some of these conditions include:  Some types of heart disease.  Some types of lung disease.  Placenta previa. This is when the placenta partially or completely covers the opening of the uterus (cervix).  Frequent bleeding from the vagina during your pregnancy.  Incompetent cervix. This is when your cervix does not remain as tightly closed during pregnancy as it should.  Premature labor.  Ruptured membranes. This is when the protective sac (amniotic sac) opens up and amniotic fluid leaks from your vagina.  Severely low blood count (anemia).  Preeclampsia or pregnancy-caused high blood pressure.  Carrying more than one baby (multiple gestation) and having an additional risk of early labor.  Poorly controlled diabetes.  Being severely underweight or severely overweight.  Intrauterine growth restriction. This is when your baby's growth and development during pregnancy are slower than expected.  Other medical conditions. Ask your health care provider if any apply to you.  What else should I know about exercising during pregnancy? You should take these precautions while exercising during pregnancy:  Avoid overheating. ? Wear loose-fitting, breathable clothes. ? Do not exercise in very high temperatures.  Avoid dehydration. Drink enough water before, during, and after exercise to keep your urine clear or pale yellow.  Avoid overstretching. Because of hormone changes during pregnancy, it is easy to overstretch muscles, tendons, and ligaments during  pregnancy.  Start slowly and ask your health care provider to recommend types of exercise that are safe for you, if exercising regularly is new for you.  Pregnancy is not a time for exercising to lose weight. When should I seek medical care? You should stop exercising and call your health care provider if you have any unusual symptoms, such as:  Mild uterine contractions or abdominal cramping.  Dizziness that does not improve with rest.  When should I seek immediate medical care? You should stop exercising and call your local emergency services (911 in the [place] if you have any unusual symptoms, such as:  Sudden, severe pain in your low back or your belly.  Uterine contractions or abdominal cramping that do not improve with rest.  Chest pain.  Bleeding or fluid leaking from your vagina.  Shortness of breath.  This information is not intended to replace advice given to you by your health care provider. Make sure you discuss any   questions you have with your health care provider. Document Released: 01/21/2005 Document Revised: 06/21/2015 Document Reviewed: 03/31/2014 Elsevier Interactive Patient Education  2018 [employer] Eating Plan for Pregnant Women While you are pregnant, your body will require additional nutrition to help support your growing baby. It is recommended that you consume:  150 additional calories each day during your first trimester.  300 additional calories each day during your second trimester.  300 additional calories each day during your third trimester.  Eating a healthy, well-balanced diet is very important for your health and for your baby's health. You also have a higher need for some vitamins and minerals, such as folic acid, calcium, iron, and vitamin D. What do I need to know about eating during pregnancy?  Do not try to lose weight or go on a diet during pregnancy.  Choose healthy, nutritious foods. Choose  of a sandwich with a glass of milk  instead of a candy bar or a high-calorie sugar-sweetened beverage.  Limit your overall intake of foods that have "empty calories." These are foods that have little nutritional value, such as sweets, desserts, candies, sugar-sweetened beverages, and fried foods.  Eat a variety of foods, especially fruits and vegetables.  Take a prenatal vitamin to help meet the additional needs during pregnancy, specifically for folic acid, iron, calcium, and vitamin D.  Remember to stay active. Ask your health care provider for exercise recommendations that are specific to you.  Practice good food safety and cleanliness, such as washing your hands before you eat and after you prepare raw meat. This helps to prevent foodborne illnesses, such as listeriosis, that can be very dangerous for your baby. Ask your health care provider for more information about listeriosis. What does 150 extra calories look like? Healthy options for an additional 150 calories each day could be any of the following:  Plain low-fat yogurt (6-8 oz) with  cup of berries.  1 apple with 2 teaspoons of peanut butter.  Cut-up vegetables with  cup of hummus.  Low-fat chocolate milk (8 oz or 1 cup).  1 string cheese with 1 medium orange.   of a peanut butter and jelly sandwich on whole-wheat bread (1 tsp of peanut butter).  For 300 calories, you could eat two of those healthy options each day. What is a healthy amount of weight to gain? The recommended amount of weight for you to gain is based on your pre-pregnancy BMI. If your pre-pregnancy BMI was:  Less than 18 (underweight), you should gain 28-40 lb.  18-24.9 (normal), you should gain 25-35 lb.  25-29.9 (overweight), you should gain 15-25 lb.  Greater than 30 (obese), you should gain 11-20 lb.  What if I am having twins or multiples? Generally, pregnant women who will be having twins or multiples may need to increase their daily calories by 300-600 calories each day. The  recommended range for total weight gain is 25-54 lb, depending on your pre-pregnancy BMI. Talk with your health care provider for specific guidance about additional nutritional needs, weight gain, and exercise during your pregnancy. What foods can I eat? Grains Any grains. Try to choose whole grains, such as whole-wheat bread, oatmeal, or brown rice. Vegetables Any vegetables. Try to eat a variety of colors and types of vegetables to get a full range of vitamins and minerals. Remember to wash your vegetables well before eating. Fruits Any fruits. Try to eat a variety of colors and types of fruit to get a full range of vitamins and   minerals. Remember to wash your fruits well before eating. Meats and Other Protein Sources Lean meats, including chicken, [place], fish, and lean cuts of beef, veal, or pork. Make sure that all meats are cooked to "well done." Tofu. Tempeh. Beans. Eggs. Peanut butter and other nut butters. Seafood, such as shrimp, crab, and lobster. If you choose fish, select types that are higher in omega-3 fatty acids, including salmon, herring, mussels, trout, sardines, and pollock. Make sure that all meats are cooked to food-safe temperatures. Dairy Pasteurized milk and milk alternatives. Pasteurized yogurt and pasteurized cheese. Cottage cheese. Sour cream. Beverages Water. Juices that contain 100% fruit juice or vegetable juice. Caffeine-free teas and decaffeinated coffee. Drinks that contain caffeine are okay to drink, but it is better to avoid caffeine. Keep your total caffeine intake to less than 200 mg each day (12 oz of coffee, tea, or soda) or as directed by your health care provider. Condiments Any pasteurized condiments. Sweets and Desserts Any sweets and desserts. Fats and Oils Any fats and oils. The items listed above may not be a complete list of recommended foods or beverages. Contact your dietitian for more options. What foods are not  recommended? Vegetables Unpasteurized (raw) vegetable juices. Fruits Unpasteurized (raw) fruit juices. Meats and Other Protein Sources Cured meats that have nitrates, such as bacon, salami, and hotdogs. Luncheon meats, bologna, or other deli meats (unless they are reheated until they are steaming hot). Refrigerated pate, meat spreads from a meat counter, smoked seafood that is found in the refrigerated section of a store. Raw fish, such as sushi or sashimi. High mercury content fish, such as tilefish, shark, swordfish, and king mackerel. Raw meats, such as tuna or beef tartare. Undercooked meats and poultry. Make sure that all meats are cooked to food-safe temperatures. Dairy Unpasteurized (raw) milk and any foods that have raw milk in them. Soft cheeses, such as feta, queso blanco, queso fresco, Brie, Camembert cheeses, blue-veined cheeses, and Panela cheese (unless it is made with pasteurized milk, which must be stated on the label). Beverages Alcohol. Sugar-sweetened beverages, such as sodas, teas, or energy drinks. Condiments Homemade fermented foods and drinks, such as pickles, sauerkraut, or kombucha drinks. (Store-bought pasteurized versions of these are okay.) Other Salads that are made in the store, such as ham salad, chicken salad, egg salad, tuna salad, and seafood salad. The items listed above may not be a complete list of foods and beverages to avoid. Contact your dietitian for more information. This information is not intended to replace advice given to you by your health care provider. Make sure you discuss any questions you have with your health care provider. Document Released: 11/05/2013 Document Revised: 06/29/2015 Document Reviewed: 07/06/2013 Elsevier Interactive Patient Education  2018 [employer].   Vaginal Bleeding During Pregnancy, First Trimester A small amount of bleeding (spotting) from the vagina is relatively common in early pregnancy. It usually stops on its  own. Various things may cause bleeding or spotting in early pregnancy. Some bleeding may be related to the pregnancy, and some may not. In most cases, the bleeding is normal and is not a problem. However, bleeding can also be a sign of something serious. Be sure to tell your health care provider about any vaginal bleeding right away. Some possible causes of vaginal bleeding during the first trimester include:  Infection or inflammation of the cervix.  Growths (polyps) on the cervix.  Miscarriage or threatened miscarriage.  Pregnancy tissue has developed outside of the uterus and in a  fallopian tube (tubal pregnancy).  Tiny cysts have developed in the uterus instead of pregnancy tissue (molar pregnancy).  Follow these instructions at home: Watch your condition for any changes. The following actions may help to lessen any discomfort you are feeling:  Follow your health care provider's instructions for limiting your activity. If your health care provider orders bed rest, you may need to stay in bed and only get up to use the bathroom. However, your health care provider may allow you to continue light activity.  If needed, make plans for someone to help with your regular activities and responsibilities while you are on bed rest.  Keep track of the number of pads you use each day, how often you change pads, and how soaked (saturated) they are. Write this down.  Do not use tampons. Do not douche.  Do not have sexual intercourse or orgasms until approved by your health care provider.  If you pass any tissue from your vagina, save the tissue so you can show it to your health care provider.  Only take over-the-counter or prescription medicines as directed by your health care provider.  Do not take aspirin because it can make you bleed.  Keep all follow-up appointments as directed by your health care provider.  Contact a health care provider if:  You have any vaginal bleeding during any  part of your pregnancy.  You have cramps or labor pains.  You have a fever, not controlled by medicine. Get help right away if:  You have severe cramps in your back or belly (abdomen).  You pass large clots or tissue from your vagina.  Your bleeding increases.  You feel light-headed or weak, or you have fainting episodes.  You have chills.  You are leaking fluid or have a gush of fluid from your vagina.  You pass out while having a bowel movement. This information is not intended to replace advice given to you by your health care provider. Make sure you discuss any questions you have with your health care provider. Document Released: 10/31/2004 Document Revised: 06/29/2015 Document Reviewed: 09/28/2012 Elsevier Interactive Patient Education  [employer].

## 2017-04-16 NOTE — Progress Notes (Addendum)
Routine Prenatal Care Visit  Subjective  Terri Ho is a 32 y.o. G5P1031 at [redacted]w[redacted]d being seen today for ongoing prenatal care.  She is currently monitored for the following issues for this high-risk pregnancy and has Obesity, unspecified; Obstructive sleep apnea of adult; Type 2 diabetes mellitus ([place]); Vitamin B12 deficiency (non anemic); Vitamin D deficiency; Supervision of high risk pregnancy, antepartum, first trimester; Pre-existing type 2 diabetes mellitus during pregnancy in first trimester; Antepartum bleeding, first trimester; Obesity affecting pregnancy; BMI 31.0-31.9,adult; and History of cesarean delivery affecting pregnancy on their problem list.  ----------------------------------------------------------------------------------- Patient reports not feeling well yesterday and when she went to the bathroom and wiped she noticed a streak of brown blood. She has had some cramping that comes and goes every 30 minutes this morning. It lasts for a few seconds at a time. She thinks it may be intestinal. She has also continued to have some streaks of red/pink when wiping overnight and this morning. She had one clot that was about 1 millimeter.  She has f/u on Friday to go over her blood sugar log. Her fasting levels have been around 120. Discussion of the importance of healthy diet and exercise and possible need for medication. Ultrasound at 11 am this morning.  . Vag. Bleeding: Other.   . Denies leaking of fluid.  ----------------------------------------------------------------------------------- The following portions of the patient's history were reviewed and updated as appropriate: allergies, current medications, past family history, past medical history, past social history, past surgical history and problem list. Problem list updated.   Objective  Blood pressure 112/60, weight 194 lb (88 kg), last menstrual period 02/16/2017. Pregravid weight 178 lb (80.7 kg) Total Weight Gain 16 lb  (7.258 kg) Urinalysis: Urine Protein: Trace Urine Glucose: Negative  Fetal Status: viability scan today for reassurance given spotting. Fetal heart rate is 180. Early gestation appears normal. No evidence of [place].  General:  Alert, oriented and cooperative. Patient is in no acute distress.  Skin: Skin is warm and dry. No rash noted.   Cardiovascular: Normal heart rate noted  Respiratory: Normal respiratory effort, no problems with respiration noted  Abdomen: Soft, gravid, appropriate for gestational age.       Pelvic:  Cervical exam deferred        Extremities: Normal range of motion.     Mental Status: Normal mood and affect. Normal behavior. Normal judgment and thought content.   Assessment   32 y.o. [id number] at [redacted]w[redacted]d by  11/20/2017, by Ultrasound presenting for work-in prenatal visit  Plan   FIFTH Problems (from 03/28/17 to present)    Problem Noted Resolved   Obesity affecting pregnancy 04/11/2017 by Will Bonnet, MD No   BMI 31.0-31.9,adult 04/11/2017 by Will Bonnet, MD No   History of cesarean delivery affecting pregnancy 04/11/2017 by Will Bonnet, MD No   Supervision of high risk pregnancy, antepartum, first trimester 03/28/2017 by Rexene Agent, CNM No   Overview Addendum 04/04/2017 10:01 AM by Rexene Agent, [place] Prenatal Labs  Dating  Blood type: O/Positive/-- (02/22 1504)   Genetic Screen 1 Screen:    AFP:     Quad:     NIPS: Antibody:Negative (02/22 1504)  Anatomic [place]  Rubella: 3.62 (02/22 1504) Varicella: Immune  GTT Early:               Third trimester:  RPR: Non Reactive (02/22 1504)   Rhogam  HBsAg: Negative (02/22 1504)   TDaP vaccine  Flu Shot: HIV: Non Reactive (02/22 1504)   Baby Food                                GBS:   Contraception  Pap: 06/05/2016, NILM  CBB     CS/VBAC    Support Person Husband Devon                 Preterm labor symptoms and general obstetric precautions including but not  limited to vaginal bleeding, contractions, leaking of fluid and fetal movement were reviewed in detail with the patient. Please refer to After Visit Summary for other counseling recommendations.   Return for has f/u already scheduled.   Rod Can, CNM 04/16/2017 10:30 AM

## 2017-04-16 NOTE — Telephone Encounter (Signed)
Please send in °

## 2017-04-16 NOTE — Progress Notes (Signed)
ROB Spotting/bleeding when wipes/cramping/few clots

## 2017-04-16 NOTE — Telephone Encounter (Signed)
Pt was seen today ad forgot to mention she needs her test stripes sent in for her GTT machine. [facility] on [address]

## 2017-04-17 MED ORDER — GLUCOSE BLOOD VI STRP: ORAL_STRIP | 12 refills | Status: DC

## 2017-04-17 NOTE — Progress Notes (Signed)
Rx sent for Contour Next test strips

## 2017-04-17 NOTE — Addendum Note (Signed)
Addended by: Rod Can on: 04/17/2017 04:25 PM   Modules accepted: Orders

## 2017-04-17 NOTE — Telephone Encounter (Signed)
Spoke with patient and got name of glucometer, sent in test strip Rx.

## 2017-04-18 ENCOUNTER — Encounter: Payer: Self-pay | Admitting: Obstetrics and Gynecology

## 2017-04-18 ENCOUNTER — Ambulatory Visit (INDEPENDENT_AMBULATORY_CARE_PROVIDER_SITE_OTHER): Payer: BLUE CROSS/BLUE SHIELD | Admitting: Obstetrics and Gynecology

## 2017-04-18 VITALS — BP 120/60 | Wt 192.0 lb

## 2017-04-18 DIAGNOSIS — O34219 Maternal care for unspecified type scar from previous cesarean delivery: Secondary | ICD-10-CM

## 2017-04-18 DIAGNOSIS — O99211 Obesity complicating pregnancy, first trimester: Secondary | ICD-10-CM

## 2017-04-18 DIAGNOSIS — Z3A09 9 weeks gestation of pregnancy: Secondary | ICD-10-CM

## 2017-04-18 DIAGNOSIS — O0991 Supervision of high risk pregnancy, unspecified, first trimester: Secondary | ICD-10-CM

## 2017-04-18 DIAGNOSIS — O24111 Pre-existing diabetes mellitus, type 2, in pregnancy, first trimester: Secondary | ICD-10-CM

## 2017-04-18 DIAGNOSIS — Z6831 Body mass index (BMI) 31.0-31.9, adult: Secondary | ICD-10-CM

## 2017-04-18 DIAGNOSIS — E119 Type 2 diabetes mellitus without complications: Secondary | ICD-10-CM

## 2017-04-18 MED ORDER — METFORMIN HCL 500 MG PO TABS
ORAL_TABLET | ORAL | 1 refills | Status: DC
Start: 2017-04-18 — End: 2017-06-08

## 2017-04-18 MED ORDER — ACCU-CHEK NANO SMARTVIEW W/DEVICE KIT: 1.0000 | PACK | 0 refills | Status: DC

## 2017-04-18 MED ORDER — GLUCOSE BLOOD VI STRP: ORAL_STRIP | 12 refills | Status: DC

## 2017-04-18 NOTE — Progress Notes (Signed)
Routine Prenatal Care Visit  Subjective  Terri Ho is a 32 y.o. G5P1031 at [redacted]w[redacted]d being seen today for ongoing prenatal care.  She is currently monitored for the following issues for this high-risk pregnancy and has Obesity, unspecified; Obstructive sleep apnea of adult; Type 2 diabetes mellitus ([place]); Vitamin B12 deficiency (non anemic); Vitamin D deficiency; Supervision of high risk pregnancy, antepartum, first trimester; Pre-existing type 2 diabetes mellitus during pregnancy in first trimester; Antepartum bleeding, first trimester; Obesity affecting pregnancy; BMI 31.0-31.9,adult; and History of cesarean delivery affecting pregnancy on their problem list.  ----------------------------------------------------------------------------------- Patient reports no complaints.   Contractions: Not present. Vag. Bleeding: Scant.   . Denies leaking of fluid.  ----------------------------------------------------------------------------------- The following portions of the patient's history were reviewed and updated as appropriate: allergies, current medications, past family history, past medical history, past social history, past surgical history and problem list. Problem list updated.   Objective  Blood pressure 120/60, weight 192 lb (87.1 kg), last menstrual period 02/16/2017. Pregravid weight 178 lb (80.7 kg) Total Weight Gain 14 lb (6.35 kg) Urinalysis: Urine Protein: Negative Urine Glucose: Negative  Fetal Status:           General:  Alert, oriented and cooperative. Patient is in no acute distress.  Skin: Skin is warm and dry. No rash noted.   Cardiovascular: Normal heart rate noted  Respiratory: Normal respiratory effort, no problems with respiration noted  Abdomen: Soft, gravid, appropriate for gestational age. Pain/Pressure: Absent     Pelvic:  Cervical exam deferred        Extremities: Normal range of motion.     ental Status: Normal mood and affect. Normal behavior. Normal  judgment and thought content.     Assessment   32 y.o. [id number] at [redacted]w[redacted]d by  11/20/2017, by Ultrasound presenting for routine prenatal visit  Plan   FIFTH Problems (from 03/28/17 to present)    Problem Noted Resolved   Obesity affecting pregnancy 04/11/2017 by Will Bonnet, MD No   BMI 31.0-31.9,adult 04/11/2017 by Will Bonnet, MD No   History of cesarean delivery affecting pregnancy 04/11/2017 by Will Bonnet, MD No   Supervision of high risk pregnancy, antepartum, first trimester 03/28/2017 by Rexene Agent, CNM No   Overview Addendum 04/04/2017 10:01 AM by Rexene Agent, [place] Prenatal Labs  Dating  Blood type: O/Positive/-- (02/22 1504)   Genetic Screen 1 Screen:    AFP:     Quad:     NIPS: Antibody:Negative (02/22 1504)  Anatomic [place]  Rubella: 3.62 (02/22 1504) Varicella: Immune  GTT Early:               Third trimester:  RPR: Non Reactive (02/22 1504)   Rhogam  HBsAg: Negative (02/22 1504)   TDaP vaccine                       Flu Shot: HIV: Non Reactive (02/22 1504)   Baby Food                                GBS:   Contraception  Pap: 06/05/2016, NILM  CBB     CS/VBAC    Support Person                  Gestational age appropriate obstetric precautions including but not limited to vaginal bleeding, contractions, leaking of fluid  and fetal movement were reviewed in detail with the patient.    Reviewed patient's blood glucose log. Patient had multiple elevated blood glucose values. Patient has not gone to lifestyles. She says that she does not plan on going to lifestyles because she has been before and considers it a waste of her time.  I discussed with Laurieanne that the first line recommended therapy for diabetes in pregnancy is Insulin. Terrion is refusing to be started on insulin. She would prefer to be treated with an oral agent. I printed the ACOG Practice bulletin and read through with the patient the available information and risks about  oral agents for the treatment of diabtes and reviewed with the patient that metformin and glyburide are not approved by the FDA for the treatment of diabetes in pregnancy because the impact of theses medications which have been shown to cross the placenta are not known. Faduma continued to refuse insulin and requested to be started on an oral agent so she was prescribed metformin.   Will send patient for MFM consult.   Over 60 minutes spent with the patient with more than 50% of that time spent on counseling.   Return in about 1 week (around 04/25/2017) for ROB.  [place], [facility] 04/25/2017, 7:42 AM    This portion of the ACOG practice bulletin #190 was read and explained to the patient in detail.:  Oral antidiabetic medications (eg, metformin and glyburide) increasingly are being used among women with GDM, despite the fact that they have not been approved by the U.S. Food and Drug Administration for this indication (62) and even though insulin continues to be the ADA-recommended first-line therapy (19)  Metformin is a biguanide that inhibits hepatic gluconeogenesis and glucose absorption and stimulates glucose uptake in peripheral tissues. Historically, metformin primarily has been used in women with pregestational diabetes or those with polycystic ovary syndrome and infertility. In women with polycystic ovary syndrome, metformin is often continued until the end of the first trimester, despite only limited evidence to suggest that such use decreases the risks of adverse pregnancy outcomes, including first-trimester loss (65). Metformin crosses the placenta with levels that can be as high as maternal concentrations. The long-term metabolic influence on the offspring is unknown (66); however, one recent study found similar developmental outcomes by 34 years of age (64). This concern about the fetal exposure to metformin and the absence of long-term neonatal  follow-up after in-utero metformin exposure is one reason the ADA continues to recommend that when pharmacologic treatment of GDM is indicated, insulin is considered the preferred treatment for diabetes in pregnancy (19). In one large trial, 36 women with GDM were randomly assigned to receive insulin therapy or metformin (plus insulin if needed). Both groups experienced similar rates of a composite outcome of perinatal morbidity, consisting of neonatal hypoglycemia, respiratory distress, need for phototherapy, birth trauma, prematurity, and low Apgar scores (66). In another prospective trial, women randomized to metformin had lower mean glucose levels, less gestational weight gain, and neonates with lower rates of hypoglycemia than those randomized to insulin (67).  Meta-analyses comparing metformin to insulin have been conflicting dependent upon whether unpublished studies or women with type II diabetes mellitus are included. In an initial meta-analysis that included only published data, the differences between neonates delivered to women randomized to metformin versus insulin were minimal (68, 69). Yet, women randomized to metformin experienced a higher rate of preterm birth (risk ratio [RR], 1.5), but a lower rate  of gestational hypertension (RR, 0.53) (68).  In a recent meta-analysis that included unpublished trials, a network meta-analysis was performed (70). This method combines information across multiple treatments simultaneously with the analysis of direct evidence (which comes from studies directly randomizing treatments of interest) and indirect evidence (which comes from studies comparing treatments of interest with a common comparator). The effect size did not demonstrate superiority when metformin was compared with insulin on the outcomes of large for gestational age, macrosomia, neonatal hypoglycemia, or cesarean delivery. Interestingly, in the dichotomous meta-analysis performed, no difference in  preterm delivery was demonstrated (RR 1.37, 95%; CI 0.62-3.01). A subsequent meta-analysis with trials that included women with type II diabetes and GDM also noted no increase in preterm delivery (71). Thus, although metformin may be a reasonable alternative approach to treat gestational diabetes, it is important to counsel women about the lack of superiority when compared with insulin, the placental transfer of the drug, and the absence of long-term data in exposed offspring. Additionally, in the aforementioned prospective trials, between 26% and 46% of women who took metformin alone eventually required insulin (66, 67).  The dosage for metformin usually starts at 500 mg nightly for 1 week at initiation, then increases to 500 mg twice daily. Because metformin generally is not used in patients with chronic renal disease, creatinine often is checked at baseline to ensure adequate renal function. The most common adverse effects of metformin are abdominal pain and diarrhea, which are minimized by slowly increasing the dosage. Such adverse effects were reported in 2.5-45.7% of patients enrolled in studies of metformin in pregnancy (70), and it is common to recommend taking the medication with meals to reduce symptoms. If higher doses are needed, the maximum dose is usually 2,500-3,000 mg per day in two to three divided doses. In women who decline insulin therapy or who the obstetricians or obstetric care providers believe will be unable to safely administer insulin, or for women who cannot afford insulin, metformin is a reasonable alternative choice. Glyburide is a sulfonylurea that binds to pancreatic beta-cell adenosine triphosphate potassium channel receptors to increase insulin secretion and insulin sensitivity of peripheral tissues. It should not be used in patients who report a sulfa allergy. Previous meta-analyses have noted increased risks of macrosomia and hypoglycemia with glyburide compared with insulin in  the treatment of GDM (66, 67); whereas a more recent meta-analysis only demonstrated higher rates of neonatal hypoglycemia (72). These worse outcomes are despite the fact that individual trials comparing glyburide with insulin failed to show any significant difference in degree of glycemic control (73-75). Observational studies have reported higher rates of preeclampsia, hyperbilirubinemia, and stillbirth with use of glyburide as compared with insulin, but many other outcomes have not been statistically significantly different (62, 76-81). The common dosage of glyburide is 2.5-20 mg daily in divided doses, although pharmacokinetic studies during pregnancy indicate daily doses up to 30 mg may be necessary to achieve adequate control (82). Additionally, 4-16% (or more) women required the addition of insulin to maintain good glycemic control when glyburide was used as initial treatment (73, 77, 83, 84). Despite the increased use of glyburide over the past decade (62), the evidence indicates that glyburide treatment should not be recommended as a first-choice pharmacologic treatment because, in most studies, it does not yield equivalent outcomes to insulin or metformin.  Concerns also have been raised about the safety of oral antidiabetic agents during pregnancy. For example, although an initial study that analyzed umbilical cord blood revealed no detectable glyburide  in exposed pregnancies (73), a subsequent study demonstrated that glyburide does cross the placenta (82). As mentioned previously, metformin also has been found to freely cross the placenta, and the fetus is exposed to concentrations similar to maternal levels (85). Theoretic concerns include the potential effects of in utero metformin exposure on long-term glucose homeostasis of developing offspring. It also is not yet known whether oral antidiabetic medications affect the progression to type 2 diabetes later in life in women who were treated during  pregnancy. A recent Cochrane meta-analysis, reporting data on 7,381 women, that compared insulin versus any type of oral antidiabetic pharmacological therapies noted similar effects on health outcomes. This investigation combined women taking either metformin, glyburide, or both, and acarbose (86). Individually these oral antidiabetic medications have been noted to have different clinical efficacies on maternal and neonatal outcomes with diverse safety profiles, hence pooling these trials may have a confounding effect limiting the conclusions drawn from this meta-analysis. Although current data demonstrate no adverse short-term effects on maternal or neonatal health from oral antidiabetic therapy during pregnancy, long-term outcomes are not yet available. Thus, health care providers should counsel women of the limitations in safety data when prescribing oral agents to women with GDM. Taking into account that oral antidiabetic medications are not approved by the U.S. Food and Drug Administration for the treatment of GDM, cross the placenta, and lack long-term neonatal safety data; and considering that summaries of the current medical literature note poor trial quality while not being designed to assess equivalence or noninferiority when comparing oral agents to insulin; insulin is considered the preferred treatment when pharmacologic treatment of GDM is indicated. Although this recommendation aligns with the ADA recommendation, ACOG recognizes that clinical situations may occur that necessitate the use of oral agents. As aforementioned, in women who decline insulin or who the obstetricians or obstetric care providers believe will be unable to safely administer insulin, or for women who cannot afford insulin, metformin (and rarely glyburide) is a reasonable alternative choice in the context of discussing with the patient the limitations of the safety data and a high rate of treatment failure that requires insulin  supplementation.

## 2017-04-28 ENCOUNTER — Ambulatory Visit (INDEPENDENT_AMBULATORY_CARE_PROVIDER_SITE_OTHER): Payer: BLUE CROSS/BLUE SHIELD | Admitting: Obstetrics and Gynecology

## 2017-04-28 ENCOUNTER — Encounter: Payer: Self-pay | Admitting: Dietician

## 2017-04-28 ENCOUNTER — Encounter: Payer: Self-pay | Admitting: Obstetrics and Gynecology

## 2017-04-28 VITALS — BP 104/64 | Wt 191.0 lb

## 2017-04-28 DIAGNOSIS — O0991 Supervision of high risk pregnancy, unspecified, first trimester: Secondary | ICD-10-CM

## 2017-04-28 DIAGNOSIS — O24111 Pre-existing diabetes mellitus, type 2, in pregnancy, first trimester: Secondary | ICD-10-CM

## 2017-04-28 DIAGNOSIS — Z6831 Body mass index (BMI) 31.0-31.9, adult: Secondary | ICD-10-CM

## 2017-04-28 DIAGNOSIS — O99211 Obesity complicating pregnancy, first trimester: Secondary | ICD-10-CM

## 2017-04-28 DIAGNOSIS — O34219 Maternal care for unspecified type scar from previous cesarean delivery: Secondary | ICD-10-CM

## 2017-04-28 DIAGNOSIS — G4733 Obstructive sleep apnea (adult) (pediatric): Secondary | ICD-10-CM

## 2017-04-28 DIAGNOSIS — E6609 Other obesity due to excess calories: Secondary | ICD-10-CM

## 2017-04-28 NOTE — Progress Notes (Signed)
Pt reports no problems.   

## 2017-04-28 NOTE — Progress Notes (Signed)
Patient has not responded to message left to reschedule her cancelled appointment on 04/14/17. Sent discharge letter to referring provider.

## 2017-04-28 NOTE — Progress Notes (Signed)
Routine Prenatal Care Visit  Subjective  Terri Ho is a 32 y.o. G5P1031 at [redacted]w[redacted]d being seen today for ongoing prenatal care.  She is currently monitored for the following issues for this high-risk pregnancy and has Obesity, unspecified; Obstructive sleep apnea of adult; Type 2 diabetes mellitus ([place]); Vitamin B12 deficiency (non anemic); Vitamin D deficiency; Supervision of high risk pregnancy, antepartum, first trimester; Pre-existing type 2 diabetes mellitus during pregnancy in first trimester; Antepartum bleeding, first trimester; Obesity affecting pregnancy; BMI 31.0-31.9,adult; and History of cesarean delivery affecting pregnancy on their problem list.  ----------------------------------------------------------------------------------- Patient reports no complaints. Patient reports because of GI illness she did not take metformin for 3 days last week. Patient brought her glucose log for review. She has not been taking her blood glucose levels correctly. She reports that she takes a fasting when she wakes up. She then tests her glucose before she eats lunch and then before bed which is 3 hours or more after she eats dinner. I discussed with there patient that the proper way to monitor blood glucose during pregnancy is to test in the morning fasting, and then two hours after meals (breakfast, lunch and dinner). This was also reviewed with the patient at her last visit. I encouraged that patient to attend lifestyles for counseling on diabetes in pregnancy, glucose monitoring and diet, but the patient is not interested and declined. Patient given glucose log. Patient does have an appointment this week with [facility].   Contractions: Not present. Vag. Bleeding: None.   . Denies leaking of fluid.  ----------------------------------------------------------------------------------- The following portions of the patient's history were reviewed and updated as appropriate: allergies, current  medications, past family history, past medical history, past social history, past surgical history and problem list. Problem list updated.   Objective  Blood pressure 104/64, weight 191 lb (86.6 kg), last menstrual period 02/16/2017. Pregravid weight 178 lb (80.7 kg) Total Weight Gain 13 lb (5.897 kg) Urinalysis: Urine Protein: Trace Urine Glucose: Negative  Fetal Status: Fetal Heart Rate (bpm): 171         General:  Alert, oriented and cooperative. Patient is in no acute distress.  Skin: Skin is warm and dry. No rash noted.   Cardiovascular: Normal heart rate noted  Respiratory: Normal respiratory effort, no problems with respiration noted  Abdomen: Soft, gravid, appropriate for gestational age. Pain/Pressure: Absent     Pelvic:  Cervical exam deferred        Extremities: Normal range of motion.     ental Status: Normal mood and affect. Normal behavior. Normal judgment and thought content.   Bedside abdominal US showed Fetal heart rate to be 171bpm.  Assessment   32 y.o. [id number] at [redacted]w[redacted]d by  11/20/2017, by Ultrasound presenting for routine prenatal visit  Plan   FIFTH Problems (from 03/28/17 to present)    Problem Noted Resolved   Obesity affecting pregnancy 04/11/2017 by Will Bonnet, MD No   BMI 31.0-31.9,adult 04/11/2017 by Will Bonnet, MD No   History of cesarean delivery affecting pregnancy 04/11/2017 by Will Bonnet, MD No   Supervision of high risk pregnancy, antepartum, first trimester 03/28/2017 by Rexene Agent, CNM No   Overview Addendum 04/04/2017 10:01 AM by Rexene Agent, [place] Prenatal Labs  Dating  Blood type: O/Positive/-- (02/22 1504)   Genetic Screen 1 Screen:    AFP:     Quad:     NIPS: Antibody:Negative (02/22 1504)  Anatomic [place]  Rubella: 3.62 (02/22 1504) Varicella: Immune  GTT Early:               Third trimester:  RPR: Non Reactive (02/22 1504)   Rhogam  HBsAg: Negative (02/22 1504)   TDaP vaccine                        Flu Shot: HIV: Non Reactive (02/22 1504)   Baby Food                                GBS:   Contraception  Pap: 06/05/2016, NILM  CBB     CS/VBAC    Support Person                  Gestational age appropriate obstetric precautions including but not limited to vaginal bleeding, contractions, leaking of fluid and fetal movement were reviewed in detail with the patient.     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 Patient does only eat small infrequent meals which is complicating her blood glucose monitoring.   Type II diabetes: Continue metformin.  Given glucose log and proper timing of glucose checks discussed. Will obtain CMP/ protein creatinine ratio today for baseline lab values. Patient taking a folic acid supplement.  History of Cesarean Section: Reports her previous cesarean was for failed induction of labor. She would like to VBAC. Desires Genetic screening. Patient will need MaterniTi 21 test at next visit.   Return in about 1 week (around 05/05/2017) for [place].  Adrian Prows MD  [facility], [facility] 04/28/2017, 1:31 PM

## 2017-04-29 LAB — COMPREHENSIVE METABOLIC PANEL
A/G RATIO: 2 (ref 1.2–2.2)
ALBUMIN: 4 g/dL (ref 3.5–5.5)
ALT: 26 IU/L (ref 0–32)
AST: 20 IU/L (ref 0–40)
Alkaline Phosphatase: 52 IU/L (ref 39–117)
BILIRUBIN TOTAL: 0.3 mg/dL (ref 0.0–1.2)
BUN / CREAT RATIO: 20 (ref 9–23)
BUN: 12 mg/dL (ref 6–20)
CHLORIDE: 102 mmol/L (ref 96–106)
CO2: 21 mmol/L (ref 20–29)
Calcium: 9 mg/dL (ref 8.7–10.2)
Creatinine, Ser: 0.59 mg/dL (ref 0.57–1.00)
GFR calc non Af Amer: 121 mL/min/{1.73_m2} (ref >59)
GFR, EST AFRICAN AMERICAN: 139 mL/min/{1.73_m2} (ref >59)
Globulin, Total: 2 g/dL (ref 1.5–4.5)
Glucose: 82 mg/dL (ref 65–99)
POTASSIUM: 3.9 mmol/L (ref 3.5–5.2)
Sodium: 137 mmol/L (ref 134–144)
TOTAL PROTEIN: 6 g/dL (ref 6.0–8.5)

## 2017-05-01 ENCOUNTER — Ambulatory Visit: Payer: PRIVATE HEALTH INSURANCE

## 2017-05-05 ENCOUNTER — Ambulatory Visit
Admission: RE | Admit: 2017-05-05 | Discharge: 2017-05-05 | Disposition: A | Discharge status: Home / Self Care | Payer: BLUE CROSS/BLUE SHIELD | Source: Ambulatory Visit | Attending: Obstetrics & Gynecology | Admitting: Obstetrics & Gynecology

## 2017-05-05 VITALS — BP 129/82 | HR 97 | Temp 98.3°F | Resp 18 | Ht 64.0 in | Wt 192.4 lb

## 2017-05-05 DIAGNOSIS — K449 Diaphragmatic hernia without obstruction or gangrene: Secondary | ICD-10-CM | POA: Insufficient documentation

## 2017-05-05 DIAGNOSIS — O24111 Pre-existing diabetes mellitus, type 2, in pregnancy, first trimester: Secondary | ICD-10-CM | POA: Diagnosis not present

## 2017-05-05 DIAGNOSIS — Z6831 Body mass index (BMI) 31.0-31.9, adult: Secondary | ICD-10-CM

## 2017-05-05 DIAGNOSIS — Z3A11 11 weeks gestation of pregnancy: Secondary | ICD-10-CM | POA: Insufficient documentation

## 2017-05-05 DIAGNOSIS — Z9884 Bariatric surgery status: Secondary | ICD-10-CM | POA: Diagnosis not present

## 2017-05-05 DIAGNOSIS — O99611 Diseases of the digestive system complicating pregnancy, first trimester: Secondary | ICD-10-CM | POA: Diagnosis not present

## 2017-05-05 DIAGNOSIS — O0991 Supervision of high risk pregnancy, unspecified, first trimester: Secondary | ICD-10-CM | POA: Diagnosis not present

## 2017-05-05 DIAGNOSIS — O34219 Maternal care for unspecified type scar from previous cesarean delivery: Secondary | ICD-10-CM | POA: Insufficient documentation

## 2017-05-05 DIAGNOSIS — E669 Obesity, unspecified: Secondary | ICD-10-CM | POA: Insufficient documentation

## 2017-05-05 DIAGNOSIS — E119 Type 2 diabetes mellitus without complications: Secondary | ICD-10-CM | POA: Diagnosis present

## 2017-05-05 DIAGNOSIS — O99841 Bariatric surgery status complicating pregnancy, first trimester: Secondary | ICD-10-CM | POA: Insufficient documentation

## 2017-05-05 DIAGNOSIS — O99211 Obesity complicating pregnancy, first trimester: Secondary | ICD-10-CM | POA: Diagnosis not present

## 2017-05-05 LAB — CBC
HEMATOCRIT: 38.2 % (ref 35.0–47.0)
Hemoglobin: 12.9 g/dL (ref 12.0–16.0)
MCH: 31 pg (ref 26.0–34.0)
MCHC: 33.8 g/dL (ref 32.0–36.0)
MCV: 91.8 fL (ref 80.0–100.0)
Platelets: 269 10*3/uL (ref 150–440)
RBC: 4.16 MIL/uL (ref 3.80–5.20)
RDW: 14 % (ref 11.5–14.5)
WBC: 6.9 10*3/uL (ref 3.6–11.0)

## 2017-05-05 LAB — IRON AND TIBC
Iron: 127 ug/dL (ref 28–170)
Saturation Ratios: 34 % — ABNORMAL HIGH (ref 10.4–31.8)
TIBC: 374 ug/dL (ref 250–450)
UIBC: 247 ug/dL

## 2017-05-05 LAB — HEMOGLOBIN A1C
Hgb A1c MFr Bld: 5.3 % (ref 4.8–5.6)
MEAN PLASMA GLUCOSE: 105.41 mg/dL

## 2017-05-05 LAB — FERRITIN: FERRITIN: 10 ng/mL — AB (ref 11–307)

## 2017-05-05 LAB — VITAMIN B12: VITAMIN B 12: 270 pg/mL (ref 180–914)

## 2017-05-05 LAB — CALCIUM: CALCIUM: 9.1 mg/dL (ref 8.9–10.3)

## 2017-05-05 LAB — GLUCOSE, CAPILLARY: GLUCOSE-CAPILLARY: 136 mg/dL — AB (ref 65–99)

## 2017-05-05 NOTE — Progress Notes (Signed)
Maternal-Fetal Medicine Consultation Terri Ho is a 32 year-old [place] at 70 4/7 weeks who presents for MFM consultation. We saw her during her prior pregnancy for the indication of type II diabetes. Since then, she has undergone weight loss surgery via laparoscopic sleeve gastrectomy and then had aesthetic surgery in the [place] (tummy tuck and buttock lift).  She is currently on metformin and has an appointment scheduled with nutrition/Lifestyles.  Pre weight 204 pounds Postweight/Pre-pregnancy weight. 168 and then up to 185  Since her weight loss surgery, her blood sugar values have been normal and she stopped treatment for diabetes. She was recently restarted on metformin in the pregnancy.  Last A1C:    09/21/13: 5.8%   PMH: Obesity, type II diabetes, hiatal hernia (repaired) PSH: Low-transverse c-section at 41 weeks in 2014, laparoscopic sleeve gastrectomy and hiatal hernia repair in 2015 PObH: G5 P1031   2014: Low-transverse cesarean at 38 weeks for non-reassuring fetal status. Underwent an IOL when routine fetal testing demonstrated a non-reactive NST followed by BPP 2/8   One SAB, no complications, no D&C    Two uncomplicated first trimester TAB PGYn: Remote history of trich and HPV. Last pap normal. No other STD history  Meds: PNV, folic acid, metformin 765 mg at bedtime All: NKDA SH: No tobacco. Employed as a [employer]il [employer] FH: No FH of genetic disorders or babies with birth defects or MR ROS: No complaints.  Exam: Vitals:   05/05/17 0917  BP: 129/82  Pulse: 97  Resp: 18  Temp: 98.3 F (36.8 C)  SpO2: 99%    Accuchecks: FS- 90, 92, 90, 102, 95, 108, 93, 96 2hr PP breakfast- 95, 90, 100, 93, 94, 92, 90 2hr PP lunch- 104, 98, 96, 102, 94, 96, 98 2hr PP dinner- 110, 102, 120, 122, 106, 104, 107  Prenatal labs (03/28/17): Blood type O positive, antibody screen neg, HIV non-reactive, Hep B neg, RPR non-reactive, Rubella Immune, Varicella immune. Hct 40.1,  MCV 91, Plt 285, hemoglobin electorphoresis normal, urine tox screen neg, GC/Chl neg, urine C&S mixed  Other labs: 04/18/17: AST 20, ALT 26, Cr 0.59, Total Bili 0.3 06/05/16: Pap normal 01/06/14: TSH 1.22 09/21/13: iron 74, TIBC 305, ferritin 40, folate 16   Assessment and Recommendations: 32 year-old G5 P1031 at 11 4/7 weeks here for MFM consultation. Pregnancy complicated by type II diabetes, history of sleeve gastrectomy, and prior term low-transverse cesarean delivery.  1. Type II Diabetes in Pregnancy Aeon's fasting blood sugars are mildly elevated on metformin 500 mg at bedtime. Her postprandial values are all normal.  We discussed the importance of good glycemic control in the pregnancy to optimize pregnancy outcomes.  We also discussed that the best-studied medication and the best medication to optimize neonatal outcomes for treatment of type II diabetes in pregnancy is insulin.  As her values are borderline, it is reasonable to continue with metformin now. She has not had a recent A1C so we sent that today to determine where she is. She also has an appointment scheduled with nutrition and Lifestyles. See other recs below.  2. History of sleeve gastrectomy Women with malabsorptive weight-loss surgery often do well in pregnancy and are at lower risk for medical complications of pregnancy. But, adequate vitamin/mineral supplementation is important.  She is on a prenatal vitamin and additional folic acid. We will check her serum levels of folate, B12, thiamine, calcium and vitamin D toady.  In addition, we will assess her iron levels. See below.  3. History  of Cesarean delivery Ruchy has a history of a term low-transverse cesarean delivery. She desires attempt at Trial of labor after cesarean delivery. She is aware of risks/benefits of both a scheduled, repeat cesarean delivery as well as an attempt at a trial of labor. She will discuss further with [facility].   Summary of Recs: -Continue  metformin but increase to 1000 mg in the evening and to check accuchecks four times daily.  -Detailed anatomy scan at 18-20 weeks. We will be happy to perform if desired -Fetal echo at 22 weeks. Please let us know if you would like [place] to help set up -Desires aneuploidy screening and will have through [facility] today that were sent: A1C, folate, B12, iron, ferritin, TIBC, vitamin D, calcium, thiamine. -Continue daily prenatal vitamin -Follow fetal growth with ultrasound every month starting at 24 weeks -Initiate fetal testing at 32 weeks, increasing to twice weekly at 34 weeks -Timing of delivery will be dependent on control -RTC 2 weeks to review sugar log and blood sent from today  Philamena Kramar, [place] A, MD

## 2017-05-06 ENCOUNTER — Encounter: Payer: BLUE CROSS/BLUE SHIELD | Admitting: Obstetrics and Gynecology

## 2017-05-07 LAB — VITAMIN B1: Vitamin B1 (Thiamine): 119.7 nmol/L (ref 66.5–200.0)

## 2017-05-10 LAB — VITAMIN D 1,25 DIHYDROXY
VITAMIN D2 1, 25 (OH): 11 pg/mL
Vitamin D 1, 25 (OH)2 Total: 87 pg/mL — ABNORMAL HIGH
Vitamin D3 1, 25 (OH)2: 76 pg/mL

## 2017-05-12 ENCOUNTER — Encounter: Payer: Self-pay | Admitting: Obstetrics and Gynecology

## 2017-05-12 ENCOUNTER — Ambulatory Visit (INDEPENDENT_AMBULATORY_CARE_PROVIDER_SITE_OTHER): Payer: BLUE CROSS/BLUE SHIELD | Admitting: Obstetrics and Gynecology

## 2017-05-12 VITALS — BP 102/62 | Wt 193.0 lb

## 2017-05-12 DIAGNOSIS — O34219 Maternal care for unspecified type scar from previous cesarean delivery: Secondary | ICD-10-CM

## 2017-05-12 DIAGNOSIS — E119 Type 2 diabetes mellitus without complications: Secondary | ICD-10-CM

## 2017-05-12 DIAGNOSIS — Z6831 Body mass index (BMI) 31.0-31.9, adult: Secondary | ICD-10-CM

## 2017-05-12 DIAGNOSIS — Z1379 Encounter for other screening for genetic and chromosomal anomalies: Secondary | ICD-10-CM

## 2017-05-12 DIAGNOSIS — O99211 Obesity complicating pregnancy, first trimester: Secondary | ICD-10-CM

## 2017-05-12 DIAGNOSIS — Z9884 Bariatric surgery status: Secondary | ICD-10-CM

## 2017-05-12 DIAGNOSIS — Z9889 Other specified postprocedural states: Secondary | ICD-10-CM

## 2017-05-12 DIAGNOSIS — Z3A12 12 weeks gestation of pregnancy: Secondary | ICD-10-CM

## 2017-05-12 DIAGNOSIS — O0991 Supervision of high risk pregnancy, unspecified, first trimester: Secondary | ICD-10-CM

## 2017-05-12 MED ORDER — CITRANATAL BLOOM 90-1 MG PO TABS: 1.0000 | ORAL_TABLET | Freq: Every day | ORAL | 3 refills | Status: DC

## 2017-05-12 NOTE — Progress Notes (Signed)
Routine Prenatal Care Visit  Subjective  Terri Ho is a 32 y.o. G5P1031 at [redacted]w[redacted]d being seen today for ongoing prenatal care.  She is currently monitored for the following issues for this high-risk pregnancy and has Obesity, unspecified; Obstructive sleep apnea of adult; Type 2 diabetes mellitus ([place]); Vitamin B12 deficiency (non anemic); Vitamin D deficiency; Supervision of high risk pregnancy, antepartum, first trimester; Pre-existing type 2 diabetes mellitus during pregnancy in first trimester; Antepartum bleeding, first trimester; Obesity affecting pregnancy; BMI 31.0-31.9,adult; History of cesarean delivery affecting pregnancy; History of bariatric surgery; and S/P abdominoplasty on their problem list.  ----------------------------------------------------------------------------------- Patient reports no complaints. She is taking metformin twice a day currently.    Contractions: Not present. Vag. Bleeding: None.   . Denies leaking of fluid.  ----------------------------------------------------------------------------------- The following portions of the patient's history were reviewed and updated as appropriate: allergies, current medications, past family history, past medical history, past social history, past surgical history and problem list. Problem list updated.   Objective  Blood pressure 102/62, weight 193 lb (87.5 kg), last menstrual period 02/16/2017. Pregravid weight 178 lb (80.7 kg) Total Weight Gain 15 lb (6.804 kg) Urinalysis: Urine Protein: Negative Urine Glucose: Negative  Fetal Status: Fetal Heart Rate (bpm): 169         General:  Alert, oriented and cooperative. Patient is in no acute distress.  Skin: Skin is warm and dry. No rash noted.   Cardiovascular: Normal heart rate noted  Respiratory: Normal respiratory effort, no problems with respiration noted  Abdomen: Soft, gravid, appropriate for gestational age. Pain/Pressure: Absent     Pelvic:  Cervical exam  deferred        Extremities: Normal range of motion.     ental Status: Normal mood and affect. Normal behavior. Normal judgment and thought content.   Bedside US showed FHR 169.   BLOOD GLUCOSE LOG 4/1- 4/8 Fasting: 96, 95, 94, 92, 90, 94, 92, 93 2hr after breakfast: 88 94 100 104 91 95 95 2hr after Lunch: 95 94 102 90 96 88  2hr after Dinner: 106 100 103 98 97 102   Assessment   32 y.o. G5P1031 at [redacted]w[redacted]d by  11/20/2017, by Ultrasound presenting for routine prenatal visit  Plan   FIFTH Problems (from 03/28/17 to present)    Problem Noted Resolved   History of bariatric surgery 05/05/2017 by Grotegut, [place], MD No   Obesity affecting pregnancy 04/11/2017 by Will Bonnet, MD No   BMI 31.0-31.9,adult 04/11/2017 by Will Bonnet, MD No   History of cesarean delivery affecting pregnancy 04/11/2017 by Will Bonnet, MD No   Supervision of high risk pregnancy, antepartum, first trimester 03/28/2017 by Rexene Agent, CNM No   Overview Addendum 04/04/2017 10:01 AM by Rexene Agent, [place] Prenatal Labs  Dating  Blood type: O/Positive/-- (02/22 1504)   Genetic Screen 1 Screen:    AFP:     Quad:     NIPS: Antibody:Negative (02/22 1504)  Anatomic [place]  Rubella: 3.62 (02/22 1504) Varicella: Immune  GTT Early:               Third trimester:  RPR: Non Reactive (02/22 1504)   Rhogam  HBsAg: Negative (02/22 1504)   TDaP vaccine                       Flu Shot: HIV: Non Reactive (02/22 1504)   Baby Food  GBS:   Contraception  Pap: 06/05/2016, NILM  CBB     CS/VBAC    Support Person                  Gestational age appropriate obstetric precautions including but not limited to vaginal bleeding, contractions, leaking of fluid and fetal movement were reviewed in detail with the patient.    Vitamin studies were normal. Prescribed citranatal bloom multivitamin Genetic screening with [id number] today.  Continue current dose of metformin.  Patient following with [facility] as well for glucose control.  Return in about 2 weeks (around 05/26/2017) for ROB.  [place], [facility] 05/12/2017, 7:21 PM

## 2017-05-12 NOTE — Progress Notes (Signed)
Pt reports no problems. Shows improvement with blood sugars with metformin BID.

## 2017-05-17 LAB — MATERNIT 21 PLUS CORE, BLOOD
Chromosome 13: NEGATIVE
Chromosome 18: NEGATIVE
Chromosome 21: NEGATIVE
Y Chromosome: DETECTED

## 2017-05-19 ENCOUNTER — Ambulatory Visit
Admission: RE | Admit: 2017-05-19 | Discharge: 2017-05-19 | Disposition: A | Discharge status: Home / Self Care | Payer: BLUE CROSS/BLUE SHIELD | Source: Ambulatory Visit | Attending: Maternal and Fetal Medicine | Admitting: Maternal and Fetal Medicine

## 2017-05-19 DIAGNOSIS — O99211 Obesity complicating pregnancy, first trimester: Secondary | ICD-10-CM

## 2017-05-19 DIAGNOSIS — O24112 Pre-existing diabetes mellitus, type 2, in pregnancy, second trimester: Secondary | ICD-10-CM | POA: Insufficient documentation

## 2017-05-19 DIAGNOSIS — Z6831 Body mass index (BMI) 31.0-31.9, adult: Secondary | ICD-10-CM | POA: Diagnosis not present

## 2017-05-19 DIAGNOSIS — Z7984 Long term (current) use of oral hypoglycemic drugs: Secondary | ICD-10-CM | POA: Diagnosis not present

## 2017-05-19 DIAGNOSIS — O34219 Maternal care for unspecified type scar from previous cesarean delivery: Secondary | ICD-10-CM

## 2017-05-19 DIAGNOSIS — Z9884 Bariatric surgery status: Secondary | ICD-10-CM | POA: Diagnosis not present

## 2017-05-19 DIAGNOSIS — O0991 Supervision of high risk pregnancy, unspecified, first trimester: Secondary | ICD-10-CM | POA: Diagnosis not present

## 2017-05-19 DIAGNOSIS — Z3A13 13 weeks gestation of pregnancy: Secondary | ICD-10-CM | POA: Insufficient documentation

## 2017-05-19 LAB — GLUCOSE, CAPILLARY: Glucose-Capillary: 81 mg/dL (ref 65–99)

## 2017-05-19 NOTE — Progress Notes (Signed)
[facility] Medicine Consultation   Chief Complaint: Follow-up blood sugar check  HPI: Terri Ho is a 32 y.o. [id number] at [redacted]w[redacted]d who presents in consultation after having metformin adjusted by Dr Claybon Jabs 2 weeks ago.   Past Medical History: Patient  has a past medical history of Diabetes mellitus (04/06/2012), Family history of breast cancer (06/2016), and Vitamin D deficiency.  Past Surgical History: She  has a past surgical history that includes Bariatric Surgery (01/2014); Cesarean section (10/28/2012); and TUMMY TUCK (11/2014).  Obstetric History:  OB History    Gravida  5   Para  1   Term  1   Preterm  0   AB  3   Living  1     SAB  1   TAB  2   Ectopic      Multiple      Live Births  1            Medications: Metformin 1000mg  nightly Allergies: Patient has No Known Allergies.  Social History: Patient  reports that she has never smoked. She has never used smokeless tobacco. She reports that she does not drink alcohol or use drugs.  Family History: family history includes Breast cancer (age of onset: 63) in her mother; COPD in her father; Congestive Heart Failure in her father; Diabetes in her father; Hypertension in her mother.  Review of Systems A full 12 point review of systems was negative or as noted in the History of Present Illness.  HgbA1c 05/05/17: 5.1% M21= low risk for aneuploidy All BS wnl except one FBS=95  Asessement: 1. History of bariatric surgery   2. Obesity affecting pregnancy in first trimester   3. BMI 31.0-31.9,adult   4. History of cesarean delivery affecting pregnancy   5. Supervision of high risk pregnancy, antepartum, first trimester     Plan: Continue metformin at current dose. We discussed that as her pregnancy progresses, her placental growth may lead to increased insulin resistance and she may require insulin therapy but at this point she may continue with the current regimen.  Recommend detailed anatomy  ultrasound RTO in 4 weeks to review blood sugars.   Total time spent with the patient was 20 minutes with greater than 50% spent in counseling and coordination of care. We appreciate this interesting consult and will be happy to be involved in the ongoing care of Terri Ho in anyway her obstetricians desire.  Ricardo Jericho, MD [facility]

## 2017-05-20 NOTE — Progress Notes (Signed)
Called patient 05/20/17 at 08:35, left message to call for result. Normal XY female fetus.  Please call patient with result. Thank you, Dr. Gilman Schmidt

## 2017-05-26 ENCOUNTER — Encounter: Payer: BLUE CROSS/BLUE SHIELD | Admitting: Obstetrics and Gynecology

## 2017-06-08 ENCOUNTER — Other Ambulatory Visit: Payer: Self-pay | Admitting: Obstetrics and Gynecology

## 2017-06-08 DIAGNOSIS — E119 Type 2 diabetes mellitus without complications: Secondary | ICD-10-CM

## 2017-06-16 ENCOUNTER — Ambulatory Visit
Admission: RE | Admit: 2017-06-16 | Discharge: 2017-06-16 | Disposition: A | Discharge status: Home / Self Care | Payer: BLUE CROSS/BLUE SHIELD | Source: Ambulatory Visit | Attending: Maternal and Fetal Medicine | Admitting: Maternal and Fetal Medicine

## 2017-06-17 ENCOUNTER — Encounter: Payer: Self-pay | Admitting: Obstetrics and Gynecology

## 2017-06-17 ENCOUNTER — Ambulatory Visit (INDEPENDENT_AMBULATORY_CARE_PROVIDER_SITE_OTHER): Payer: BLUE CROSS/BLUE SHIELD | Admitting: Obstetrics and Gynecology

## 2017-06-17 VITALS — BP 110/70 | Wt 198.0 lb

## 2017-06-17 DIAGNOSIS — O0991 Supervision of high risk pregnancy, unspecified, first trimester: Secondary | ICD-10-CM

## 2017-06-17 DIAGNOSIS — O34219 Maternal care for unspecified type scar from previous cesarean delivery: Secondary | ICD-10-CM

## 2017-06-17 DIAGNOSIS — B373 Candidiasis of vulva and vagina: Secondary | ICD-10-CM

## 2017-06-17 DIAGNOSIS — R300 Dysuria: Secondary | ICD-10-CM

## 2017-06-17 DIAGNOSIS — O24111 Pre-existing diabetes mellitus, type 2, in pregnancy, first trimester: Secondary | ICD-10-CM

## 2017-06-17 DIAGNOSIS — B3731 Acute candidiasis of vulva and vagina: Secondary | ICD-10-CM

## 2017-06-17 LAB — POCT URINALYSIS DIPSTICK
BILIRUBIN UA: NEGATIVE
Blood, UA: NEGATIVE
Glucose, UA: NEGATIVE
KETONES UA: NEGATIVE
Nitrite, UA: NEGATIVE
Protein, UA: NEGATIVE
Spec Grav, UA: 1.015 (ref 1.010–1.025)
Urobilinogen, UA: 1 E.U./dL
pH, UA: 5.5 (ref 5.0–8.0)

## 2017-06-17 MED ORDER — TERCONAZOLE 0.4 % VA CREA
1.0000 | TOPICAL_CREAM | Freq: Every day | VAGINAL | 0 refills | Status: DC
Start: 2017-06-17 — End: 2017-07-07

## 2017-06-17 NOTE — Progress Notes (Signed)
Routine Prenatal Care Visit  Subjective  Terri Ho is a 32 y.o. G5P1031 at [redacted]w[redacted]d being seen today for ongoing prenatal care.  She is currently monitored for the following issues for this high-risk pregnancy and has Obesity, unspecified; Obstructive sleep apnea of adult; Type 2 diabetes mellitus ([place]); Vitamin B12 deficiency (non anemic); Vitamin D deficiency; Supervision of high risk pregnancy, antepartum, first trimester; Pre-existing type 2 diabetes mellitus during pregnancy in first trimester; Antepartum bleeding, first trimester; Obesity affecting pregnancy; BMI 31.0-31.9,adult; History of cesarean delivery affecting pregnancy; History of bariatric surgery; and S/P abdominoplasty on their problem list.  ----------------------------------------------------------------------------------- Patient reports small pink seen when wiping. Frequent urination, some irritation and pain with urination. Some clumpy white discharge.  Contractions: Not present. Vag. Bleeding: None.  Movement: Present. Denies leaking of fluid.  ----------------------------------------------------------------------------------- The following portions of the patient's history were reviewed and updated as appropriate: allergies, current medications, past family history, past medical history, past social history, past surgical history and problem list. Problem list updated.   Objective  Blood pressure 110/70, weight 198 lb (89.8 kg), last menstrual period 02/16/2017. Pregravid weight 178 lb (80.7 kg) Total Weight Gain 20 lb (9.072 kg) Urinalysis: Urine Protein: Negative Urine Glucose: Negative  Fetal Status: Fetal Heart Rate (bpm): 154   Movement: Present     General:  Alert, oriented and cooperative. Patient is in no acute distress.  Skin: Skin is warm and dry. No rash noted.   Cardiovascular: Normal heart rate noted  Respiratory: Normal respiratory effort, no problems with respiration noted  Abdomen: Soft, gravid,  appropriate for gestational age. Pain/Pressure: Present     Pelvic:  Cervical exam deferred       Cervix visually closed and long. Thick white clumpy vaginal discharge clinging to vaginal walls  Extremities: Normal range of motion.     ental Status: Normal mood and affect. Normal behavior. Normal judgment and thought content.   Microscopic wet-mount exam shows hyphae, white blood cells.    Assessment   32 y.o. [id number] at [redacted]w[redacted]d by  11/20/2017, by Ultrasound presenting for routine prenatal visit  Plan   FIFTH Problems (from 03/28/17 to present)    Problem Noted Resolved   History of bariatric surgery 05/05/2017 by Grotegut, [place], MD No   Obesity affecting pregnancy 04/11/2017 by Will Bonnet, MD No   BMI 31.0-31.9,adult 04/11/2017 by Will Bonnet, MD No   History of cesarean delivery affecting pregnancy 04/11/2017 by Will Bonnet, MD No   Supervision of high risk pregnancy, antepartum, first trimester 03/28/2017 by Rexene Agent, CNM No   Overview Addendum 06/17/2017 11:11 AM by Homero Fellers, MD    Clinic [facility] Prenatal Labs  Dating 8wk [place] Blood type: O/Positive/-- (02/22 1504)   Genetic Screen  NIPS: normal XY Antibody:Negative (02/22 1504)  Anatomic [place]  Rubella: 3.62 (02/22 1504) Varicella: Immune  GTT  Has diabetes RPR: Non Reactive (02/22 1504)   Rhogam  HBsAg: Negative (02/22 1504)   TDaP vaccine                        Flu Shot: HIV: Non Reactive (02/22 1504)   Baby Food                                GBS:   Contraception  Pap: 06/05/2016, NILM  CBB     CS/VBAC    Support Person  Gestational age appropriate obstetric precautions including but not limited to vaginal bleeding, contractions, leaking of fluid and fetal movement were reviewed in detail with the patient.    Patient is following with [facility] for glucose management. Currently on metformin twice a day. She reports she lost her glucometer for a week, but found it and  is starting to take blood glucose levels again. She did not bring a log to the office with her today. She was given a glucose log book that is pocket size for convenience.  Yeast vaginitis today, will send prescription for Terazol 7.  Sysuria, urine culture sent, POCT UA in office +leukocytes only.   Return in about 3 weeks (around 07/08/2017) for anatomy US and ROB.  Adrian Prows MD [facility], [facility] 06/17/17 11:12 AM

## 2017-06-19 LAB — URINE CULTURE

## 2017-06-19 NOTE — Progress Notes (Signed)
Please call and let patient know that urine culture was negative, she does not have a UTI.  Thank you, Dr. Gilman Schmidt

## 2017-06-23 ENCOUNTER — Ambulatory Visit
Admission: RE | Admit: 2017-06-23 | Discharge: 2017-06-23 | Disposition: A | Discharge status: Home / Self Care | Payer: BLUE CROSS/BLUE SHIELD | Source: Ambulatory Visit | Attending: Obstetrics & Gynecology | Admitting: Obstetrics & Gynecology

## 2017-06-23 DIAGNOSIS — Z9884 Bariatric surgery status: Secondary | ICD-10-CM | POA: Diagnosis not present

## 2017-06-23 DIAGNOSIS — Z803 Family history of malignant neoplasm of breast: Secondary | ICD-10-CM | POA: Insufficient documentation

## 2017-06-23 DIAGNOSIS — Z833 Family history of diabetes mellitus: Secondary | ICD-10-CM | POA: Diagnosis not present

## 2017-06-23 DIAGNOSIS — E669 Obesity, unspecified: Secondary | ICD-10-CM | POA: Insufficient documentation

## 2017-06-23 DIAGNOSIS — Z3A18 18 weeks gestation of pregnancy: Secondary | ICD-10-CM | POA: Insufficient documentation

## 2017-06-23 DIAGNOSIS — O99211 Obesity complicating pregnancy, first trimester: Secondary | ICD-10-CM | POA: Diagnosis not present

## 2017-06-23 DIAGNOSIS — Z6831 Body mass index (BMI) 31.0-31.9, adult: Secondary | ICD-10-CM

## 2017-06-23 DIAGNOSIS — O99842 Bariatric surgery status complicating pregnancy, second trimester: Secondary | ICD-10-CM | POA: Diagnosis not present

## 2017-06-23 DIAGNOSIS — Z7984 Long term (current) use of oral hypoglycemic drugs: Secondary | ICD-10-CM | POA: Insufficient documentation

## 2017-06-23 DIAGNOSIS — Z825 Family history of asthma and other chronic lower respiratory diseases: Secondary | ICD-10-CM | POA: Diagnosis not present

## 2017-06-23 DIAGNOSIS — O24112 Pre-existing diabetes mellitus, type 2, in pregnancy, second trimester: Secondary | ICD-10-CM | POA: Insufficient documentation

## 2017-06-23 DIAGNOSIS — O34219 Maternal care for unspecified type scar from previous cesarean delivery: Secondary | ICD-10-CM

## 2017-06-23 DIAGNOSIS — O99212 Obesity complicating pregnancy, second trimester: Secondary | ICD-10-CM | POA: Insufficient documentation

## 2017-06-23 DIAGNOSIS — O0991 Supervision of high risk pregnancy, unspecified, first trimester: Secondary | ICD-10-CM | POA: Diagnosis not present

## 2017-06-23 DIAGNOSIS — E119 Type 2 diabetes mellitus without complications: Secondary | ICD-10-CM | POA: Diagnosis not present

## 2017-06-23 DIAGNOSIS — Z8249 Family history of ischemic heart disease and other diseases of the circulatory system: Secondary | ICD-10-CM | POA: Diagnosis not present

## 2017-06-23 LAB — GLUCOSE, CAPILLARY: Glucose-Capillary: 96 mg/dL (ref 65–99)

## 2017-06-23 NOTE — Progress Notes (Signed)
.   [facility] Medicine Consultation   Chief Complaint: Follow-up blood sugar check  HPI: [name] is a 32 y.o. G5P1031 at [redacted]w[redacted]d who presents for follow up on her blood sugar management.  Reports doing well.  No OB concerns.   Past Medical History: Patient  has a past medical history of Diabetes mellitus (04/06/2012), Family history of breast cancer (06/2016), and Vitamin D deficiency.  Past Surgical History: She  has a past surgical history that includes Bariatric Surgery (01/2014); Cesarean section (10/28/2012); and TUMMY TUCK (11/2014).   Obstetric History:  OB History    Gravida  5   Para  1   Term  1   Preterm  0   AB  3   Living  1     SAB  1   TAB  2   Ectopic      Multiple      Live Births  1           Medications: Metformin 500 BID Allergies: Patient has No Known Allergies.  Social History: Patient  reports that she has never smoked. She has never used smokeless tobacco. She reports that she does not drink alcohol or use drugs.  Family History: family history includes Breast cancer (age of onset: 76) in her mother; COPD in her father; Congestive Heart Failure in her father; Diabetes in her father; Hypertension in her mother.  Review of Systems A full 12 point review of systems was negative or as noted in the History of Present Illness.  HgbA1c 05/05/17: 5.1% M21= low risk for aneuploidy Log review: Fasting sugars range 86 - 93 Post meals are all within range  Vitals:   06/23/17 0906 06/23/17 0914  BP: 116/79 116/79  Pulse: 89 89  Resp: 18   Temp: 98.4 F (36.9 C) 98.4 F (36.9 C)  SpO2: 97%     Asessement: 1. History of bariatric surgery   2. Obesity affecting pregnancy in first trimester   3. BMI 31.0-31.9,adult   4. History of cesarean delivery affecting pregnancy   5. Supervision of high risk pregnancy, antepartum, first trimester     Plan: -Continue metformin at current dose. I once again reinforced prior  counseling  that as her pregnancy progresses she may have increased insulin resistance requiring insulin therapy.  At this point she may continue with the current regimen.  -She is scheduled for anatomy ultrasound with her primary group, we recommend a detailed anatomical survey -Consider fetal echocardiogram given pregestational diabetes diagnosis.  Risk of congenital anomalies including heart defects is decreased by excellent control and well controlled A1C at intake.  Plan for follow up consult in 4 weeks to review blood sugars.   Total time spent with the patient was 20 minutes with greater than 50% spent in counseling and coordination of care.  We appreciate this interesting consult and will be happy to be involved in the ongoing care of Terri Ho in anyway her obstetricians desire.  Louie Bun, MD [facility]

## 2017-07-07 ENCOUNTER — Ambulatory Visit (INDEPENDENT_AMBULATORY_CARE_PROVIDER_SITE_OTHER): Payer: BLUE CROSS/BLUE SHIELD | Admitting: Obstetrics and Gynecology

## 2017-07-07 ENCOUNTER — Encounter: Payer: Self-pay | Admitting: Obstetrics and Gynecology

## 2017-07-07 ENCOUNTER — Ambulatory Visit (INDEPENDENT_AMBULATORY_CARE_PROVIDER_SITE_OTHER): Payer: BLUE CROSS/BLUE SHIELD

## 2017-07-07 VITALS — BP 110/60 | Wt 196.0 lb

## 2017-07-07 DIAGNOSIS — O0992 Supervision of high risk pregnancy, unspecified, second trimester: Secondary | ICD-10-CM

## 2017-07-07 DIAGNOSIS — E119 Type 2 diabetes mellitus without complications: Secondary | ICD-10-CM

## 2017-07-07 DIAGNOSIS — G4733 Obstructive sleep apnea (adult) (pediatric): Secondary | ICD-10-CM

## 2017-07-07 DIAGNOSIS — O0991 Supervision of high risk pregnancy, unspecified, first trimester: Secondary | ICD-10-CM

## 2017-07-07 DIAGNOSIS — Z3A20 20 weeks gestation of pregnancy: Secondary | ICD-10-CM

## 2017-07-07 DIAGNOSIS — O34219 Maternal care for unspecified type scar from previous cesarean delivery: Secondary | ICD-10-CM

## 2017-07-07 NOTE — Progress Notes (Signed)
Routine Prenatal Care Visit  Subjective  Terri Ho is a 32 y.o. G5P1031 at [redacted]w[redacted]d being seen today for ongoing prenatal care.  She is currently monitored for the following issues for this high-risk pregnancy and has Obesity, unspecified; Obstructive sleep apnea of adult; Type 2 diabetes mellitus ([place]); Vitamin B12 deficiency (non anemic); Vitamin D deficiency; Supervision of high risk pregnancy, antepartum, second trimester; Pre-existing type 2 diabetes mellitus during pregnancy in first trimester; Antepartum bleeding, first trimester; Obesity affecting pregnancy; BMI 31.0-31.9,adult; History of cesarean delivery affecting pregnancy; History of bariatric surgery; and S/P abdominoplasty on their problem list.  ----------------------------------------------------------------------------------- Patient reports no complaints.   Contractions: Not present. Vag. Bleeding: None.  Movement: Present. Denies leaking of fluid.  ----------------------------------------------------------------------------------- The following portions of the patient's history were reviewed and updated as appropriate: allergies, current medications, past family history, past medical history, past social history, past surgical history and problem list. Problem list updated.   Objective  Blood pressure 110/60, weight 196 lb (88.9 kg), last menstrual period 02/16/2017. Pregravid weight 178 lb (80.7 kg) Total Weight Gain 18 lb (8.165 kg) Urinalysis: Urine Protein: Negative Urine Glucose: Negative  Fetal Status: Fetal Heart Rate (bpm): 151   Movement: Present     General:  Alert, oriented and cooperative. Patient is in no acute distress.  Skin: Skin is warm and dry. No rash noted.   Cardiovascular: Normal heart rate noted  Respiratory: Normal respiratory effort, no problems with respiration noted  Abdomen: Soft, gravid, appropriate for gestational age. Pain/Pressure: Absent     Pelvic:  Cervical exam deferred          Extremities: Normal range of motion.     ental Status: Normal mood and affect. Normal behavior. Normal judgment and thought content.   GLUCOSE LOG:  Fasting: 92, 91, 90, 94, 90, 91, 85, 96 Breakfast: 91 90 88 95 87 96 78 90 Lunch: 100 94 78 98 102 90 93  Dinner: 100 96 108 98 112 102 116  Assessment   32 y.o. G5P1031 at [redacted]w[redacted]d by  11/20/2017, by Ultrasound presenting for routine prenatal visit  Plan   FIFTH Problems (from 03/28/17 to present)    Problem Noted Resolved   History of bariatric surgery 05/05/2017 by Grotegut, [place], MD No   Obesity affecting pregnancy 04/11/2017 by Will Bonnet, MD No   BMI 31.0-31.9,adult 04/11/2017 by Will Bonnet, MD No   History of cesarean delivery affecting pregnancy 04/11/2017 by Will Bonnet, MD No   Supervision of high risk pregnancy, antepartum, second trimester 03/28/2017 by Rexene Agent, CNM No   Overview Addendum 07/07/2017 11:37 AM by Homero Fellers, [facility] Prenatal Labs  Dating 8wk [place] Blood type: O/Positive/-- (02/22 1504)   Genetic Screen  NIPS: normal XY Antibody:Negative (02/22 1504)  Anatomic [place]  Incomplete Rubella: 3.62 (02/22 1504) Varicella: Immune  GTT  Has diabetes RPR: Non Reactive (02/22 1504)   Rhogam  Not indicated HBsAg: Negative (02/22 1504)   TDaP vaccine                        Flu Shot: HIV: Non Reactive (02/22 1504)   Baby Food                                GBS:   Contraception  Pap: 06/05/2016, NILM  CBB     CS/VBAC  Support Person                  Gestational age appropriate obstetric precautions including but not limited to vaginal bleeding, contractions, leaking of fluid and fetal movement were reviewed in detail with the patient.    Diabetes well controlled with Metformin 500mg  BID. Continue current regimen.  Anatomy incomplete, return in 2 weeks to complete, will refer for fetal ECHO as well for Type II  Diabetes.  Given information on prenatal classes with [facility].   Given information on birthcontrol options postpartum. Recommended [email or web address]. Patient is considering an IUD. Given information on cord blood banking. Given information on volunteer doula program at the hospital.  Discussed breast feeding. Patient is planning on breast feeding. Patient was given resources and lactation consultation was advised.  Patient desires a TOLAC,will discuss at next visit.   Return in about 2 weeks (around 07/21/2017) for ROB and [place].  Adrian Prows MD [facility], [facility] 07/07/17 12:06 PM

## 2017-07-07 NOTE — Progress Notes (Signed)
Pt reports no problems. Anatomy scan today. Pt has glucose log.

## 2017-07-21 ENCOUNTER — Encounter: Payer: Self-pay | Admitting: Maternal Newborn

## 2017-07-21 ENCOUNTER — Ambulatory Visit
Admission: RE | Admit: 2017-07-21 | Discharge: 2017-07-21 | Disposition: A | Discharge status: Home / Self Care | Payer: BLUE CROSS/BLUE SHIELD | Source: Ambulatory Visit | Attending: Obstetrics & Gynecology | Admitting: Obstetrics & Gynecology

## 2017-07-21 ENCOUNTER — Ambulatory Visit (INDEPENDENT_AMBULATORY_CARE_PROVIDER_SITE_OTHER): Payer: BLUE CROSS/BLUE SHIELD

## 2017-07-21 ENCOUNTER — Ambulatory Visit (INDEPENDENT_AMBULATORY_CARE_PROVIDER_SITE_OTHER): Payer: BLUE CROSS/BLUE SHIELD | Admitting: Maternal Newborn

## 2017-07-21 VITALS — BP 127/85 | HR 95 | Temp 98.2°F | Resp 18 | Wt 197.4 lb

## 2017-07-21 VITALS — BP 100/60 | Wt 200.0 lb

## 2017-07-21 DIAGNOSIS — O99842 Bariatric surgery status complicating pregnancy, second trimester: Secondary | ICD-10-CM | POA: Diagnosis not present

## 2017-07-21 DIAGNOSIS — O24111 Pre-existing diabetes mellitus, type 2, in pregnancy, first trimester: Secondary | ICD-10-CM

## 2017-07-21 DIAGNOSIS — Z7984 Long term (current) use of oral hypoglycemic drugs: Secondary | ICD-10-CM | POA: Diagnosis not present

## 2017-07-21 DIAGNOSIS — Z3A22 22 weeks gestation of pregnancy: Secondary | ICD-10-CM

## 2017-07-21 DIAGNOSIS — O24112 Pre-existing diabetes mellitus, type 2, in pregnancy, second trimester: Secondary | ICD-10-CM | POA: Insufficient documentation

## 2017-07-21 DIAGNOSIS — Z6831 Body mass index (BMI) 31.0-31.9, adult: Secondary | ICD-10-CM | POA: Diagnosis not present

## 2017-07-21 DIAGNOSIS — O34219 Maternal care for unspecified type scar from previous cesarean delivery: Secondary | ICD-10-CM | POA: Diagnosis not present

## 2017-07-21 DIAGNOSIS — O9989 Other specified diseases and conditions complicating pregnancy, childbirth and the puerperium: Secondary | ICD-10-CM | POA: Diagnosis not present

## 2017-07-21 DIAGNOSIS — O0992 Supervision of high risk pregnancy, unspecified, second trimester: Secondary | ICD-10-CM

## 2017-07-21 DIAGNOSIS — Z9884 Bariatric surgery status: Secondary | ICD-10-CM

## 2017-07-21 DIAGNOSIS — E119 Type 2 diabetes mellitus without complications: Secondary | ICD-10-CM

## 2017-07-21 DIAGNOSIS — O99212 Obesity complicating pregnancy, second trimester: Secondary | ICD-10-CM

## 2017-07-21 DIAGNOSIS — R350 Frequency of micturition: Secondary | ICD-10-CM

## 2017-07-21 LAB — POCT URINALYSIS DIPSTICK
Blood, UA: NEGATIVE
GLUCOSE UA: NEGATIVE
Ketones, UA: NEGATIVE
LEUKOCYTES UA: NEGATIVE
NITRITE UA: NEGATIVE
PROTEIN UA: POSITIVE — AB
SPEC GRAV UA: 1.015 (ref 1.010–1.025)
Urobilinogen, UA: NEGATIVE E.U./dL — AB
pH, UA: 6 (ref 5.0–8.0)

## 2017-07-21 LAB — GLUCOSE, CAPILLARY: Glucose-Capillary: 118 mg/dL — ABNORMAL HIGH (ref 65–99)

## 2017-07-21 NOTE — Progress Notes (Addendum)
[place] Follow-up Consultation  Terri Ho is a 32 year-old G5 P1031 at 12 /47 weeks here for return visit to review her sugar log.  She is being followed for type II diabetes, history of weight-loss surgery and aesthetic abdominal surgery.  She is on metformin.  Pre-weight loss surgery weight 204 pounds Postweight/pre-pregnancy weight 168 then up to 196  Following her weight loss surgery, her sugars returned to normal and she no longer required diabetes treatment but then was started on metformin in the pregnancy.  See Full initial consultation dated 05/05/17  Currently taking metformin 500 mg every 12 hours  Exam: Vitals:   07/21/17 0916  BP: 127/85  Pulse: 95  Resp: 18  Temp: 98.2 F (36.8 C)  SpO2: 99%      Blood sugars: FS:  92, 88, 94, 90, 92, 91, 96, 91, 78, 96, 91, 93, 92, 85 2hr PP breakfast: 78, 83, 80, 90, 92, 86, 92, 77, 78, 76, 90, 88, 75 2hr PP lunch: 92, 90, 86, 102, 108, 87, 92, 82, 93, 89, 106, 108 2hr PP dinner:  104, 107, 98, 103, 120, 106, 110, 89, 112, 94, 109, 120, 118  Labs (05/05/17): A1C 5.3%, Vit D 87, calcium 9.1, thiamine 120, iron 127, TIBC 374, ferritin 10 (low), B12 270, Hct 38.2, MCV 92, Plt 269  Cell-free fetal DNA: normal   -Continue metformin at current dose. If fasting sugars increase much more, please increase pm dose of metfromin from 500 mg to 1000 mg and continue am dose at 500.  -Has detailed anatomy ultrasound scheduled at [facility] -Has fetal echo scheduled -Continue multivitamin with iron.  Her baseline micronutrient levels were normal, though ferritin was low -History of sleeve gastrectomy; on multivitamin  -history of cesarean delivery -Fetal testing plan as outlined in consult dated 05/05/17 -RTC 4 weeks  Terri Ho, [place] A, MD

## 2017-07-21 NOTE — Progress Notes (Signed)
C/o peeing all the time.rj

## 2017-07-21 NOTE — Progress Notes (Signed)
Routine Prenatal Care Visit  Subjective  Terri Ho is a 32 y.o. G5P1031 at [redacted]w[redacted]d being seen today for ongoing prenatal care.  She is currently monitored for the following issues for this high-risk pregnancy and has Obesity, unspecified; Obstructive sleep apnea of adult; Type 2 diabetes mellitus ([place]); Vitamin B12 deficiency (non anemic); Vitamin D deficiency; Supervision of high risk pregnancy, antepartum, second trimester; Pre-existing type 2 diabetes mellitus during pregnancy in first trimester; Antepartum bleeding, first trimester; Obesity affecting pregnancy; BMI 31.0-31.9,adult; History of cesarean delivery affecting pregnancy; History of bariatric surgery; and S/P abdominoplasty on their problem list.  ----------------------------------------------------------------------------------- Patient reports urinary frequency. Denies dysuria and back pain. Contractions: Not present. Vag. Bleeding: None.  Movement: Present. No leaking of fluid.  ----------------------------------------------------------------------------------- The following portions of the patient's history were reviewed and updated as appropriate: allergies, current medications, past family history, past medical history, past social history, past surgical history and problem list. Problem list updated.   Objective  Blood pressure 100/60, weight 200 lb (90.7 kg), last menstrual period 02/16/2017. Pregravid weight 178 lb (80.7 kg) Total Weight Gain 22 lb (9.979 kg) Urinalysis: Urine Protein: Trace Urine Glucose: Negative  Fetal Status: Fetal Heart Rate (bpm): 153 Fundal Height: 25 cm Movement: Present     General:  Alert, oriented and cooperative. Patient is in no acute distress.  Skin: Skin is warm and dry. No rash noted.   Cardiovascular: Normal heart rate noted  Respiratory: Normal respiratory effort, no problems with respiration noted  Abdomen: Soft, gravid, appropriate for gestational age. Pain/Pressure: Absent       Pelvic:  Cervical exam deferred        Extremities: Normal range of motion.  Edema: None  Mental Status: Normal mood and affect. Normal behavior. Normal judgment and thought content.     Assessment   32 y.o. [id number] at [redacted]w[redacted]d, EDD 11/20/2017 by Ultrasound presenting for routine prenatal visit.  Plan   FIFTH Problems (from 03/28/17 to present)    Problem Noted Resolved   History of bariatric surgery 05/05/2017 by Grotegut, [place], MD No   Obesity affecting pregnancy 04/11/2017 by Will Bonnet, MD No   BMI 31.0-31.9,adult 04/11/2017 by Will Bonnet, MD No   History of cesarean delivery affecting pregnancy 04/11/2017 by Will Bonnet, MD No   Supervision of high risk pregnancy, antepartum, second trimester 03/28/2017 by Rexene Agent, CNM No   Overview Addendum 07/07/2017 12:08 PM by Homero Fellers, [place] Prenatal Labs  Dating 8wk [place] Blood type: O/Positive/-- (02/22 1504)   Genetic Screen  NIPS: normal XY Antibody:Negative (02/22 1504)  Anatomic [place]  Incomplete Rubella: 3.62 (02/22 1504) Varicella: Immune  GTT  Has diabetes RPR: Non Reactive (02/22 1504)   Rhogam  Not indicated HBsAg: Negative (02/22 1504)   TDaP vaccine                        Flu Shot: HIV: Non Reactive (02/22 1504)   Baby Food  Breast                              GBS:   Contraception IUD? Pap: 06/05/2016, NILM  CBB  Given information   CS/VBAC Would like TOLAC  [ ]  discuss risks   Support Person               Anatomy scan complete and normal today.  Has noticed increased urinary frequency and has voided 10 times today. Urine culture sent.  Preterm labor symptoms and general obstetric precautions including but not limited to vaginal bleeding, contractions, leaking of fluid and fetal movement were reviewed.  Return in about 2 weeks (around 08/04/2017) for HROB.  Avel Sensor, CNM 07/21/2017  11:25 AM

## 2017-07-21 NOTE — Addendum Note (Signed)
Encounter addended by: Perpetua Elling, [place], MD on: 07/21/2017 9:34 AM  Actions taken: Sign clinical note

## 2017-07-23 LAB — URINE CULTURE

## 2017-07-26 ENCOUNTER — Other Ambulatory Visit: Payer: Self-pay | Admitting: Obstetrics and Gynecology

## 2017-07-26 DIAGNOSIS — E119 Type 2 diabetes mellitus without complications: Secondary | ICD-10-CM

## 2017-08-05 ENCOUNTER — Ambulatory Visit (INDEPENDENT_AMBULATORY_CARE_PROVIDER_SITE_OTHER): Payer: BLUE CROSS/BLUE SHIELD | Admitting: Obstetrics and Gynecology

## 2017-08-05 ENCOUNTER — Encounter: Payer: Self-pay | Admitting: Obstetrics and Gynecology

## 2017-08-05 VITALS — BP 102/64 | Wt 198.0 lb

## 2017-08-05 DIAGNOSIS — O24112 Pre-existing diabetes mellitus, type 2, in pregnancy, second trimester: Secondary | ICD-10-CM

## 2017-08-05 DIAGNOSIS — Z3A24 24 weeks gestation of pregnancy: Secondary | ICD-10-CM

## 2017-08-05 DIAGNOSIS — O0992 Supervision of high risk pregnancy, unspecified, second trimester: Secondary | ICD-10-CM

## 2017-08-05 NOTE — Progress Notes (Signed)
Routine Prenatal Care Visit  Subjective  Terri Ho is a 32 y.o. G5P1031 at [redacted]w[redacted]d being seen today for ongoing prenatal care.  She is currently monitored for the following issues for this high-risk pregnancy and has Obesity, unspecified; Obstructive sleep apnea of adult; Type 2 diabetes mellitus ([place]); Vitamin B12 deficiency (non anemic); Vitamin D deficiency; Supervision of high risk pregnancy, antepartum, second trimester; Pre-existing type 2 diabetes mellitus during pregnancy in first trimester; Antepartum bleeding, first trimester; Obesity affecting pregnancy; BMI 31.0-31.9,adult; History of cesarean delivery affecting pregnancy; History of bariatric surgery; and S/P abdominoplasty on their problem list.  ----------------------------------------------------------------------------------- Patient reports no complaints.   Contractions: Not present. Vag. Bleeding: None.  Movement: Present. Denies leaking of fluid.  ----------------------------------------------------------------------------------- The following portions of the patient's history were reviewed and updated as appropriate: allergies, current medications, past family history, past medical history, past social history, past surgical history and problem list. Problem list updated.   Objective  Blood pressure 102/64, weight 198 lb (89.8 kg), last menstrual period 02/16/2017. Pregravid weight 178 lb (80.7 kg) Total Weight Gain 20 lb (9.072 kg) Urinalysis: Urine Protein: Trace Urine Glucose: Negative  Fetal Status: Fetal Heart Rate (bpm): 151 Fundal Height: 25 cm Movement: Present     General:  Alert, oriented and cooperative. Patient is in no acute distress.  Skin: Skin is warm and dry. No rash noted.   Cardiovascular: Normal heart rate noted  Respiratory: Normal respiratory effort, no problems with respiration noted  Abdomen: Soft, gravid, appropriate for gestational age. Pain/Pressure: Absent     Pelvic:  Cervical exam  deferred        Extremities: Normal range of motion.     ental Status: Normal mood and affect. Normal behavior. Normal judgment and thought content.     Assessment   32 y.o. [id number] at [redacted]w[redacted]d by  11/20/2017, by Ultrasound presenting for routine prenatal visit  Plan   FIFTH Problems (from 03/28/17 to present)    Problem Noted Resolved   History of bariatric surgery 05/05/2017 by Grotegut, [place], MD No   Obesity affecting pregnancy 04/11/2017 by Will Bonnet, MD No   BMI 31.0-31.9,adult 04/11/2017 by Will Bonnet, MD No   History of cesarean delivery affecting pregnancy 04/11/2017 by Will Bonnet, MD No   Supervision of high risk pregnancy, antepartum, second trimester 03/28/2017 by Rexene Agent, CNM No   Overview Addendum 07/07/2017 12:08 PM by Homero Fellers, [place] Prenatal Labs  Dating 8wk [place] Blood type: O/Positive/-- (02/22 1504)   Genetic Screen  NIPS: normal XY Antibody:Negative (02/22 1504)  Anatomic [place]  Incomplete Rubella: 3.62 (02/22 1504) Varicella: Immune  GTT  Has diabetes RPR: Non Reactive (02/22 1504)   Rhogam  Not indicated HBsAg: Negative (02/22 1504)   TDaP vaccine                        Flu Shot: HIV: Non Reactive (02/22 1504)   Baby Food  Breast                              GBS:   Contraception IUD? Pap: 06/05/2016, NILM  CBB  Given information   CS/VBAC Would like TOLAC  Valu.[place]ieves ] discuss risks   Support Person                  Gestational age appropriate obstetric precautions including but  not limited to vaginal bleeding, contractions, leaking of fluid and fetal movement were reviewed in detail with the patient.    Return in about 2 weeks (around 08/19/2017) for [place].  Did not bring glucose log to review, is still taking metformin.  Asked patient to set up mychart so that she could message with her glucose log Discussed risk of TOLAC. Individual likelihood of success in 29.4%. At this time she still desires a TOLAC.  Her fetal  ECHO is scheduled for the 86 or 17th of this month per [facility].  Will need to start growth [place] at 28 weeks and NSTs at 32 weeks.   Adrian Prows MD [facility], [facility] 08/05/17 10:16 AM

## 2017-08-05 NOTE — Progress Notes (Signed)
HROB No concerns, denies lof/pain/pressure

## 2017-08-05 NOTE — Patient Instructions (Signed)
32 y.o. [id number] at [redacted]w[redacted]d with Estimated Date of Delivery: 11/20/17 was seen today in office to discuss trial of labor after cesarean section (TOLAC) versus elective repeat cesarean delivery (ERCD). The following risks were discussed with the patient.  Risk of uterine rupture at term is 0.78 percent with TOLAC and 0.22 percent with ERCD. 1 in 10 uterine ruptures will result in neonatal death or neurological injury. The benefits of a trial of labor after cesarean (TOLAC) resulting in a vaginal birth after cesarean (VBAC) include the following: shorter length of hospital stay and postpartum recovery (in most cases); fewer complications, such as postpartum fever, wound or uterine infection, thromboembolism (blood clots in the leg or lung), need for blood transfusion and fewer neonatal breathing problems. The risks of an attempted VBAC or TOLAC include the following: . Risk of failed trial of labor after cesarean (TOLAC) without a vaginal birth after cesarean (VBAC) resulting in repeat cesarean delivery (RCD) in about 20 to 56 percent of women who attempt VBAC.  Her individualized success rate using the MFMU VBAC risk calculator is       %.   . Risk of rupture of uterus resulting in an emergency cesarean delivery. The risk of uterine rupture may be related in part to the type of uterine incision made during the first cesarean delivery. A previous transverse uterine incision has the lowest risk of rupture (0.2 to 1.5 percent risk). Vertical or T-shaped uterine incisions have a higher risk of uterine rupture (4 to 9 percent risk)The risk of fetal death is very low with both VBAC and elective repeat cesarean delivery (ERCD), but the likelihood of fetal death is higher with VBAC than with ERCD. Maternal death is very rare with either type of delivery. The risks of an elective repeat cesarean delivery (ERCD) were reviewed with the patient including but not limited to: 03/998 risk of uterine rupture which could have  serious consequences, bleeding which may require transfusion; infection which may require antibiotics; injury to bowel, bladder or other surrounding organs (bowel, bladder, ureters); injury to the fetus; need for additional procedures including hysterectomy in the event of a life-threatening hemorrhage; thromboembolic phenomenon; abnormal placentation; incisional problems; death and other postoperative or anesthesia complications.    In addition we discussed that our collective office practice is to allow patient's who desire to attempt TOLAC to go into labor naturally.  There is some limited data that rupture rate may increase past [redacted] weeks gestation, but it is reasonable for women who are strongly committed to [facility] to continue pregnancy into the 41st week.  Medical indications necessetating early delivery may arise during the course of any pregnancy.  Given the contraindication on the use of prostaglandins for use in cervical ripening,  recommendation would be to proceed with repeat cesarean for delivery for patient's with unfavorable cervix (low Bishops score) who reach 41 weeks or who otherwise have a medical indication for early delivery.   These risks and benefits are summarized on the consent form, which was reviewed with the patient during the visit.  All her questions answered and she signed a consent indicating a preference for TOLAC/ERCD. A copy of the consent was given to the patient.

## 2017-08-18 ENCOUNTER — Ambulatory Visit: Payer: Self-pay

## 2017-08-20 ENCOUNTER — Encounter: Payer: BLUE CROSS/BLUE SHIELD | Admitting: Maternal Newborn

## 2017-08-29 ENCOUNTER — Encounter: Payer: Self-pay | Admitting: Maternal Newborn

## 2017-08-29 ENCOUNTER — Ambulatory Visit (INDEPENDENT_AMBULATORY_CARE_PROVIDER_SITE_OTHER): Payer: BLUE CROSS/BLUE SHIELD | Admitting: Maternal Newborn

## 2017-08-29 ENCOUNTER — Other Ambulatory Visit (HOSPITAL_COMMUNITY)
Admission: RE | Admit: 2017-08-29 | Discharge: 2017-08-29 | Disposition: A | Discharge status: Home / Self Care | Payer: BLUE CROSS/BLUE SHIELD | Source: Ambulatory Visit | Attending: Maternal Newborn | Admitting: Maternal Newborn

## 2017-08-29 VITALS — BP 120/80 | Wt 198.0 lb

## 2017-08-29 DIAGNOSIS — Z3A28 28 weeks gestation of pregnancy: Secondary | ICD-10-CM

## 2017-08-29 DIAGNOSIS — O99213 Obesity complicating pregnancy, third trimester: Secondary | ICD-10-CM

## 2017-08-29 DIAGNOSIS — O0993 Supervision of high risk pregnancy, unspecified, third trimester: Secondary | ICD-10-CM | POA: Insufficient documentation

## 2017-08-29 DIAGNOSIS — O26893 Other specified pregnancy related conditions, third trimester: Secondary | ICD-10-CM | POA: Insufficient documentation

## 2017-08-29 DIAGNOSIS — O9989 Other specified diseases and conditions complicating pregnancy, childbirth and the puerperium: Secondary | ICD-10-CM | POA: Diagnosis not present

## 2017-08-29 DIAGNOSIS — Z113 Encounter for screening for infections with a predominantly sexual mode of transmission: Secondary | ICD-10-CM | POA: Insufficient documentation

## 2017-08-29 DIAGNOSIS — N898 Other specified noninflammatory disorders of vagina: Secondary | ICD-10-CM | POA: Diagnosis present

## 2017-08-29 DIAGNOSIS — O24113 Pre-existing diabetes mellitus, type 2, in pregnancy, third trimester: Secondary | ICD-10-CM

## 2017-08-29 DIAGNOSIS — Z3689 Encounter for other specified antenatal screening: Secondary | ICD-10-CM

## 2017-08-29 DIAGNOSIS — O0992 Supervision of high risk pregnancy, unspecified, second trimester: Secondary | ICD-10-CM

## 2017-08-29 DIAGNOSIS — O24111 Pre-existing diabetes mellitus, type 2, in pregnancy, first trimester: Secondary | ICD-10-CM

## 2017-08-29 LAB — POCT WET PREP (WET MOUNT)
Clue Cells Wet Prep Whiff POC: NEGATIVE
TRICHOMONAS WET PREP HPF POC: ABSENT

## 2017-08-29 NOTE — Progress Notes (Addendum)
Routine Prenatal Care Visit  Subjective  Terri Ho is a 32 y.o. G5P1031 at [redacted]w[redacted]d being seen today for ongoing prenatal care.  She is currently monitored for the following issues for this high-risk pregnancy and has Obesity, unspecified; Obstructive sleep apnea of adult; Type 2 diabetes mellitus ([place]); Vitamin B12 deficiency (non anemic); Vitamin D deficiency; Supervision of high risk pregnancy, antepartum, second trimester; Pre-existing type 2 diabetes mellitus during pregnancy in first trimester; Antepartum bleeding, first trimester; Obesity affecting pregnancy; BMI 31.0-31.9,adult; History of cesarean delivery affecting pregnancy; History of bariatric surgery; and S/P abdominoplasty on their problem list.  ----------------------------------------------------------------------------------- Patient reports some brownish discharge for about two weeks. No burning or itching and no pelvic pain or dysuria. Occasional Braxton-Hicks. Contractions: Not present. Vag. Bleeding: None.  Movement: Present. No leaking of fluid.  ----------------------------------------------------------------------------------- The following portions of the patient's history were reviewed and updated as appropriate: allergies, current medications, past family history, past medical history, past social history, past surgical history and problem list. Problem list updated.   Objective  Blood pressure 120/80, weight 198 lb (89.8 kg), last menstrual period 02/16/2017. Pregravid weight 178 lb (80.7 kg) Total Weight Gain 20 lb (9.072 kg) Urinalysis: Urine Protein: Trace Urine Glucose: Negative  Fetal Status: Fetal Heart Rate (bpm): 144   Movement: Present     General:  Alert, oriented and cooperative. Patient is in no acute distress.  Skin: Skin is warm and dry. No rash noted.   Cardiovascular: Normal heart rate noted  Respiratory: Normal respiratory effort, no problems with respiration noted  Abdomen: Soft, gravid,  appropriate for gestational age. Pain/Pressure: Absent     Pelvic:  Cervical exam deferred        Extremities: Normal range of motion.  Edema: None  Mental Status: Normal mood and affect. Normal behavior. Normal judgment and thought content.   No yeast, clue cells, or trichomonads on wet prep.  Assessment   32 y.o. [id number] at [redacted]w[redacted]d, EDD 11/20/2017 by Ultrasound presenting for routine prenatal visit.  Plan   FIFTH Problems (from 03/28/17 to present)    Problem Noted Resolved   History of bariatric surgery 05/05/2017 by Grotegut, [place], MD No   Obesity affecting pregnancy 04/11/2017 by Will Bonnet, MD No   BMI 31.0-31.9,adult 04/11/2017 by Will Bonnet, MD No   History of cesarean delivery affecting pregnancy 04/11/2017 by Will Bonnet, MD No   Supervision of high risk pregnancy, antepartum, second trimester 03/28/2017 by Rexene Agent, CNM No   Overview Addendum 08/05/2017 10:17 AM by Homero Fellers, MD    Clinic [facility] Prenatal Labs  Dating 8wk [place] Blood type: O/Positive/-- (02/22 1504)   Genetic Screen  NIPS: normal XY Antibody:Negative (02/22 1504)  Anatomic [place]  Incomplete Rubella: 3.62 (02/22 1504) Varicella: Immune  GTT  Has diabetes RPR: Non Reactive (02/22 1504)   Rhogam  Not indicated HBsAg: Negative (02/22 1504)   TDaP vaccine                        Flu Shot: HIV: Non Reactive (02/22 1504)   Baby Food  Breast                              GBS:   Contraception IUD? Pap: 06/05/2016, NILM  CBB  Given information   CS/VBAC Would like TOLAC  [x]  discuss risks   Support Person  Sent swab to rule out infection as reason for brownish vaginal discharge.  28 week labs with A1c today.  Glucose log reviewed with Dr. Gilman Schmidt. Fasting values ranged from 88-100. 4/14 were slightly elevated: 100, 96, 96, 95. Postprandial values ranged from 78-120. Keep metformin at current dose for now.  Preterm labor symptoms and general obstetric precautions  including but not limited to vaginal bleeding, contractions, leaking of fluid and fetal movement were reviewed.   Return in about 2 weeks (around 09/12/2017) for ROB with growth scan .  Avel Sensor, CNM 08/29/2017  10:03 AM

## 2017-08-29 NOTE — Progress Notes (Signed)
C/o yeast inf; has SOB Wednesday, no chest pain.rj

## 2017-08-30 LAB — CBC
Hematocrit: 34 % (ref 34.0–46.6)
Hemoglobin: 10.5 g/dL — ABNORMAL LOW (ref 11.1–15.9)
MCH: 26.9 pg (ref 26.6–33.0)
MCHC: 30.9 g/dL — ABNORMAL LOW (ref 31.5–35.7)
MCV: 87 fL (ref 79–97)
Platelets: 287 10*3/uL (ref 150–450)
RBC: 3.9 x10E6/uL (ref 3.77–5.28)
RDW: 14.2 % (ref 12.3–15.4)
WBC: 7.3 10*3/uL (ref 3.4–10.8)

## 2017-08-30 LAB — HEMOGLOBIN A1C
Est. average glucose Bld gHb Est-mCnc: 117 mg/dL
Hgb A1c MFr Bld: 5.7 % — ABNORMAL HIGH (ref 4.8–5.6)

## 2017-08-30 LAB — RPR: RPR Ser Ql: NONREACTIVE

## 2017-08-30 LAB — HIV ANTIBODY (ROUTINE TESTING W REFLEX): HIV Screen 4th Generation wRfx: NONREACTIVE

## 2017-08-30 LAB — ANTIBODY SCREEN: ANTIBODY SCREEN: NEGATIVE

## 2017-09-03 ENCOUNTER — Other Ambulatory Visit: Payer: Self-pay | Admitting: Maternal Newborn

## 2017-09-03 LAB — CERVICOVAGINAL ANCILLARY ONLY
Bacterial vaginitis: NEGATIVE
CHLAMYDIA, DNA PROBE: NEGATIVE
Candida vaginitis: NEGATIVE
NEISSERIA GONORRHEA: NEGATIVE
Trichomonas: NEGATIVE

## 2017-09-03 MED ORDER — FERROUS SULFATE 325 (65 FE) MG PO TABS: 325.0000 mg | ORAL_TABLET | Freq: Every day | ORAL | 4 refills | Status: DC

## 2017-09-03 NOTE — Progress Notes (Signed)
Sent Rx for iron supplement.

## 2017-09-15 ENCOUNTER — Ambulatory Visit (INDEPENDENT_AMBULATORY_CARE_PROVIDER_SITE_OTHER): Payer: BLUE CROSS/BLUE SHIELD | Admitting: Maternal Newborn

## 2017-09-15 ENCOUNTER — Encounter: Payer: Self-pay | Admitting: Maternal Newborn

## 2017-09-15 ENCOUNTER — Ambulatory Visit (INDEPENDENT_AMBULATORY_CARE_PROVIDER_SITE_OTHER): Payer: BLUE CROSS/BLUE SHIELD

## 2017-09-15 VITALS — BP 116/72 | Wt 199.0 lb

## 2017-09-15 DIAGNOSIS — Z3A30 30 weeks gestation of pregnancy: Secondary | ICD-10-CM

## 2017-09-15 DIAGNOSIS — O0992 Supervision of high risk pregnancy, unspecified, second trimester: Secondary | ICD-10-CM

## 2017-09-15 DIAGNOSIS — O99213 Obesity complicating pregnancy, third trimester: Secondary | ICD-10-CM

## 2017-09-15 DIAGNOSIS — O24111 Pre-existing diabetes mellitus, type 2, in pregnancy, first trimester: Secondary | ICD-10-CM | POA: Diagnosis not present

## 2017-09-15 DIAGNOSIS — Z23 Encounter for immunization: Secondary | ICD-10-CM

## 2017-09-15 DIAGNOSIS — Z369 Encounter for antenatal screening, unspecified: Secondary | ICD-10-CM

## 2017-09-15 DIAGNOSIS — Z3689 Encounter for other specified antenatal screening: Secondary | ICD-10-CM

## 2017-09-15 LAB — POCT URINALYSIS DIPSTICK OB
GLUCOSE, UA: NEGATIVE — AB
PROTEIN: NEGATIVE

## 2017-09-15 NOTE — Progress Notes (Signed)
Routine Prenatal Care Visit  Subjective  Terri Ho is a 32 y.o. G5P1031 at [redacted]w[redacted]d being seen today for ongoing prenatal care.  She is currently monitored for the following issues for this high-risk pregnancy and has Obesity, unspecified; Obstructive sleep apnea of adult; Type 2 diabetes mellitus ([place]); Vitamin B12 deficiency (non anemic); Vitamin D deficiency; Supervision of high risk pregnancy, antepartum, second trimester; Pre-existing type 2 diabetes mellitus during pregnancy in first trimester; Antepartum bleeding, first trimester; Obesity affecting pregnancy; BMI 31.0-31.9,adult; History of cesarean delivery affecting pregnancy; History of bariatric surgery; and S/P abdominoplasty on their problem list.  ----------------------------------------------------------------------------------- Patient reports an episode of painful contractions; resolved with rest and hydration.   Contractions: Not present. Vag. Bleeding: None.  Movement: Present. No leaking of fluid.  ----------------------------------------------------------------------------------- The following portions of the patient's history were reviewed and updated as appropriate: allergies, current medications, past family history, past medical history, past social history, past surgical history and problem list. Problem list updated.  Objective  Blood pressure 116/72, weight 199 lb (90.3 kg), last menstrual period 02/16/2017. Pregravid weight 178 lb (80.7 kg) Total Weight Gain 21 lb (9.526 kg) Body mass index is 34.16 kg/m.   Urinalysis: Protein Negative, Glucose Negative  Fetal Status: Fetal Heart Rate (bpm): 148 Fundal Height: 31 cm Movement: Present     General:  Alert, oriented and cooperative. Patient is in no acute distress.  Skin: Skin is warm and dry. No rash noted.   Cardiovascular: Normal heart rate noted  Respiratory: Normal respiratory effort, no problems with respiration noted  Abdomen: Soft, gravid, appropriate  for gestational age. Pain/Pressure: Absent     Pelvic:  Cervical exam deferred        Extremities: Normal range of motion.  Edema: None  Mental Status: Normal mood and affect. Normal behavior. Normal judgment and thought content.     Assessment   32 y.o. T0P5465 at [redacted]w[redacted]d, EDD 11/20/2017 by Ultrasound presenting for routine prenatal visit.  Plan   FIFTH Problems (from 03/28/17 to present)    Problem Noted Resolved   History of bariatric surgery 05/05/2017 by Grotegut, [place], MD No   Obesity affecting pregnancy 04/11/2017 by Will Bonnet, MD No   BMI 31.0-31.9,adult 04/11/2017 by Will Bonnet, MD No   History of cesarean delivery affecting pregnancy 04/11/2017 by Will Bonnet, MD No   Supervision of high risk pregnancy, antepartum, second trimester 03/28/2017 by Rexene Agent, CNM No   Overview Addendum 08/05/2017 10:17 AM by Homero Fellers, MD    Clinic [facility] Prenatal Labs  Dating 8wk [place] Blood type: O/Positive/-- (02/22 1504)   Genetic Screen  NIPS: normal XY Antibody:Negative (02/22 1504)  Anatomic [place]  Incomplete Rubella: 3.62 (02/22 1504) Varicella: Immune  GTT  Has diabetes RPR: Non Reactive (02/22 1504)   Rhogam  Not indicated HBsAg: Negative (02/22 1504)   TDaP vaccine                        Flu Shot: HIV: Non Reactive (02/22 1504)   Baby Food  Breast                              GBS:   Contraception IUD? Pap: 06/05/2016, NILM  CBB  Given information   CS/VBAC Would like TOLAC  [x]  discuss risks   Support Person  Pre-existing type 2 diabetes mellitus during pregnancy in first trimester 03/28/2017 by Rexene Agent, CNM No   Overview Addendum 09/01/2017  9:33 AM by Rexene Agent, CNM    Current Diabetic Medications:  Metformin [ ]  Aspirin 81 mg daily after 12 weeks; discontinue after 36 weeks (? A2/B GDM)  Required Referrals for A1GDM or A2GDM: [ ]  Diabetes Education and Testing Supplies [ ]  Nutrition Cousult  For A2/B GDM or  higher classes of DM [ ]  Diabetes Education and Testing Supplies [ ]  Nutrition Counsult [ ]  Fetal ECHO after 22-24 weeks  [ ]  Eye exam for retina evaluation  [ ]  Baseline EKG [ ]  US fetal growth every 4 weeks starting at 28 weeks [ ]  Twice weekly NST starting at [redacted] weeks gestation [ ]  Delivery planning contingent on fetal growth, AFI, glycemic control, and other co-morbidities but at least by 39 weeks  Baseline and surveillance labs (pulled in from [facility], refresh links as needed)  Lab Results  Component Value Date   CREATININE 0.63 01/19/2014   AST 25 01/06/2014   ALT 40 01/06/2014   TSH 1.22 01/06/2014   LABPROT 12.9 09/21/2013   Lab Results  Component Value Date   HGBA1C 5.8 09/21/2013   HGBA1C 5.4 10/26/2012   HGBA1C 4.9 06/15/2012    Antenatal Testing Class of DM U/S NST/AFI DELIVERY  Diabetes   A1 - good control - O24.410    A2 - good control - O24.419      A2  - poor control or poor compliance - O24.419, E11.65   (Macrosomia or polyhydramnios) **E11.65 is extra code for poor control**    A2/B - O24.919  and B-C O24.319  Poor control B-C or D-R-F-T - O24.319  or  Type I DM - O24.019  20-38  20-38  20-24-28-32-36   20-24-28-32-35-38//fetal echo  20-24-27-30-33-36-38//fetal echo  40  32//2 x wk  32//2 x wk   32//2 x wk  28//BPP wkly then 32//2 x wk  40  39  PRN   39  PRN           Ultrasound today shows growth at 39th percentile, HC at 2.8%, AFI 11.44 cm. Discussed with MD. Repeat growth scan in 4 weeks.  TDaP received today. Start APT next visit.  Glucose log reviewed. Fasting values 78-96, 1/12 out of range. Postprandial values 78-126, 2/33 out of range. Good control, continue on Metformin.  Preterm labor symptoms and general obstetric precautions including but not limited to vaginal bleeding, contractions, leaking of fluid and fetal movement were reviewed.  Return in about 2 weeks (around 09/29/2017) for [place] with  NST/AFI.  Avel Sensor, CNM 09/15/2017  12:01 PM

## 2017-09-24 ENCOUNTER — Other Ambulatory Visit: Payer: Self-pay | Admitting: Obstetrics and Gynecology

## 2017-09-24 DIAGNOSIS — E119 Type 2 diabetes mellitus without complications: Secondary | ICD-10-CM

## 2017-09-29 ENCOUNTER — Ambulatory Visit (INDEPENDENT_AMBULATORY_CARE_PROVIDER_SITE_OTHER): Payer: BLUE CROSS/BLUE SHIELD

## 2017-09-29 ENCOUNTER — Encounter: Payer: Self-pay | Admitting: Obstetrics and Gynecology

## 2017-09-29 ENCOUNTER — Ambulatory Visit (INDEPENDENT_AMBULATORY_CARE_PROVIDER_SITE_OTHER): Payer: BLUE CROSS/BLUE SHIELD | Admitting: Obstetrics and Gynecology

## 2017-09-29 VITALS — BP 102/62 | Wt 199.0 lb

## 2017-09-29 DIAGNOSIS — Z3483 Encounter for supervision of other normal pregnancy, third trimester: Secondary | ICD-10-CM | POA: Diagnosis not present

## 2017-09-29 DIAGNOSIS — Z3A32 32 weeks gestation of pregnancy: Secondary | ICD-10-CM

## 2017-09-29 DIAGNOSIS — O34219 Maternal care for unspecified type scar from previous cesarean delivery: Secondary | ICD-10-CM

## 2017-09-29 DIAGNOSIS — O99213 Obesity complicating pregnancy, third trimester: Secondary | ICD-10-CM | POA: Diagnosis not present

## 2017-09-29 DIAGNOSIS — Z369 Encounter for antenatal screening, unspecified: Secondary | ICD-10-CM

## 2017-09-29 DIAGNOSIS — O24113 Pre-existing diabetes mellitus, type 2, in pregnancy, third trimester: Secondary | ICD-10-CM | POA: Diagnosis not present

## 2017-09-29 DIAGNOSIS — Z9889 Other specified postprocedural states: Secondary | ICD-10-CM

## 2017-09-29 DIAGNOSIS — Z6831 Body mass index (BMI) 31.0-31.9, adult: Secondary | ICD-10-CM

## 2017-09-29 DIAGNOSIS — O0993 Supervision of high risk pregnancy, unspecified, third trimester: Secondary | ICD-10-CM

## 2017-09-29 DIAGNOSIS — O0992 Supervision of high risk pregnancy, unspecified, second trimester: Secondary | ICD-10-CM

## 2017-09-29 LAB — FETAL NONSTRESS TEST

## 2017-09-29 LAB — POCT URINALYSIS DIPSTICK OB
GLUCOSE, UA: NEGATIVE — AB
POC,PROTEIN,UA: NEGATIVE

## 2017-09-29 NOTE — Progress Notes (Signed)
Routine Prenatal Care Visit  Subjective  Terri Ho is a 32 y.o. G5P1031 at [redacted]w[redacted]d being seen today for ongoing prenatal care.  She is currently monitored for the following issues for this high-risk pregnancy and has Obesity, unspecified; Obstructive sleep apnea of adult; Type 2 diabetes mellitus ([place]); Vitamin B12 deficiency (non anemic); Vitamin D deficiency; Supervision of high risk pregnancy, antepartum, third trimester; Pre-existing type 2 diabetes mellitus during pregnancy in third trimester; Antepartum bleeding, first trimester; Obesity affecting pregnancy; BMI 31.0-31.9,adult; History of cesarean delivery affecting pregnancy; History of bariatric surgery; and S/P abdominoplasty on their problem list.  ----------------------------------------------------------------------------------- Patient reports no complaints.   Contractions: Not present. Vag. Bleeding: None.  Movement: Present. Denies leaking of fluid.  T2DM:  The vast majority of her blood glucose values are normal with a few exceptions. She has a full log. She continues to take her metformin without issues.  U/S shows fetus in cephalic presentation with AFI 6.1 cm.    Blood Glucose log image:     ----------------------------------------------------------------------------------- The following portions of the patient's history were reviewed and updated as appropriate: allergies, current medications, past family history, past medical history, past social history, past surgical history and problem list. Problem list updated.   Objective  Blood pressure 102/62, weight 199 lb (90.3 kg), last menstrual period 02/16/2017. Pregravid weight 178 lb (80.7 kg) Total Weight Gain 21 lb (9.526 kg) Urinalysis:      Fetal Status: Fetal Heart Rate (bpm): 140   Movement: Present  Presentation: Vertex  General:  Alert, oriented and cooperative. Patient is in no acute distress.  Skin: Skin is warm and dry. No rash noted.     Cardiovascular: Normal heart rate noted  Respiratory: Normal respiratory effort, no problems with respiration noted  Abdomen: Soft, gravid, appropriate for gestational age. Pain/Pressure: Absent     Pelvic:  Cervical exam deferred        Extremities: Normal range of motion.  Edema: None  Mental Status: Normal mood and affect. Normal behavior. Normal judgment and thought content.   US Ob Limited  Result Date: 09/29/2017 ULTRASOUND REPORT Patient Name: Terri Ho DOB: [birth year] MRN: [id number] Location: [facility] Date of Service: 09/29/2017 Indications:AFI Findings: Terri Ho intrauterine pregnancy is visualized with FHR at 149 BPM. Fetal presentation is Cephalic. Placenta: Anterior, grade 1. AFI: 6.17 cm Impression: 1. [redacted]w[redacted]d Viable [place] Intrauterine pregnancy dated by previously established criteria. 2. AFI is 6.17 cm. Terri Ho, RDMS, RVT The ultrasound images and findings were reviewed by me and I agree with the above report. Prentice Docker, MD, [facility], [facility] 09/29/2017 12:12 PM     Assessment   32 y.o. [id number] at [redacted]w[redacted]d by  11/20/2017, by Ultrasound presenting for routine prenatal visit  Plan   FIFTH Problems (from 03/28/17 to present)    Problem Noted Resolved   History of bariatric surgery 05/05/2017 by Grotegut, [place], MD No   Obesity affecting pregnancy 04/11/2017 by Will Bonnet, MD No   BMI 31.0-31.9,adult 04/11/2017 by Will Bonnet, MD No   History of cesarean delivery affecting pregnancy 04/11/2017 by Will Bonnet, MD No   Supervision of high risk pregnancy, antepartum, third trimester 03/28/2017 by Rexene Agent, CNM No   Overview Addendum 08/05/2017 10:17 AM by Homero Fellers, MD    [facility]inic [facility] Prenatal Labs  Dating 8wk [place] Blood type: O/Positive/-- (02/22 1504)   Genetic Screen  NIPS: normal XY Antibody:Negative (02/22 1504)  Anatomic [place]  Incomplete Rubella:  3.62 (02/22 1504) Varicella: Immune  GTT   Has diabetes RPR: Non Reactive (02/22 1504)   Rhogam  Not indicated HBsAg: Negative (02/22 1504)   TDaP vaccine                        Flu Shot: HIV: Non Reactive (02/22 1504)   Baby Food  Breast                              GBS:   Contraception IUD? Pap: 06/05/2016, NILM  CBB  Given information   CS/VBAC Would like TOLAC  [x]  discuss risks   Support Person              Pre-existing type 2 diabetes mellitus during pregnancy in third trimester 03/28/2017 by Rexene Agent, CNM No   Overview Addendum 09/01/2017  9:33 AM by Rexene Agent, CNM    Current Diabetic Medications:  Metformin [ ]  Aspirin 81 mg daily after 12 weeks; discontinue after 36 weeks (? A2/B GDM)  Required Referrals for A1GDM or A2GDM: [ ]  Diabetes Education and Testing Supplies [ ]  Nutrition Cousult  For A2/B GDM or higher classes of DM [ ]  Diabetes Education and Testing Supplies [ ]  Nutrition Counsult [ ]  Fetal ECHO after 22-24 weeks  [ ]  Eye exam for retina evaluation  [ ]  Baseline EKG [ ]  US fetal growth every 4 weeks starting at 28 weeks [ ]  Twice weekly NST starting at [redacted] weeks gestation [ ]  Delivery planning contingent on fetal growth, AFI, glycemic control, and other co-morbidities but at least by 39 weeks  Baseline and surveillance labs (pulled in from [facility], refresh links as needed)  Lab Results  Component Value Date   CREATININE 0.63 01/19/2014   AST 25 01/06/2014   ALT 40 01/06/2014   TSH 1.22 01/06/2014   LABPROT 12.9 09/21/2013   Lab Results  Component Value Date   HGBA1C 5.8 09/21/2013   HGBA1C 5.4 10/26/2012   HGBA1C 4.9 06/15/2012    Antenatal Testing Class of DM U/S NST/AFI DELIVERY  Diabetes   A1 - good control - O24.410    A2 - good control - O24.419      A2  - poor control or poor compliance - O24.419, E11.65   (Macrosomia or polyhydramnios) **E11.65 is extra code for poor control**    A2/B - O24.919  and B-C O24.319  Poor control B-C or D-R-F-T - O24.319  or  Type I  DM - O24.019  20-38  20-38  20-24-28-32-36   20-24-28-32-35-38//fetal echo  20-24-27-30-33-36-38//fetal echo  40  32//2 x wk  32//2 x wk   32//2 x wk  28//BPP wkly then 32//2 x wk  40  39  PRN   39  PRN              Preterm labor symptoms and general obstetric precautions including but not limited to vaginal bleeding, contractions, leaking of fluid and fetal movement were reviewed in detail with the patient. Please refer to After Visit Summary for other counseling recommendations.   Return in about 1 week (around 10/06/2017) for U/S for AFI and routine prenatal with NST.  Prentice Docker, MD, [facility], [facility] 09/29/2017 1:56 PM

## 2017-10-07 ENCOUNTER — Other Ambulatory Visit: Payer: BLUE CROSS/BLUE SHIELD

## 2017-10-07 ENCOUNTER — Encounter: Payer: BLUE CROSS/BLUE SHIELD | Admitting: Maternal Newborn

## 2017-10-10 ENCOUNTER — Ambulatory Visit (INDEPENDENT_AMBULATORY_CARE_PROVIDER_SITE_OTHER): Payer: BLUE CROSS/BLUE SHIELD | Admitting: Maternal Newborn

## 2017-10-10 ENCOUNTER — Encounter: Payer: Self-pay | Admitting: Maternal Newborn

## 2017-10-10 ENCOUNTER — Ambulatory Visit (INDEPENDENT_AMBULATORY_CARE_PROVIDER_SITE_OTHER): Payer: BLUE CROSS/BLUE SHIELD

## 2017-10-10 VITALS — BP 120/78 | Wt 202.0 lb

## 2017-10-10 DIAGNOSIS — Z3A34 34 weeks gestation of pregnancy: Secondary | ICD-10-CM | POA: Diagnosis not present

## 2017-10-10 DIAGNOSIS — O99213 Obesity complicating pregnancy, third trimester: Secondary | ICD-10-CM | POA: Diagnosis not present

## 2017-10-10 DIAGNOSIS — O34219 Maternal care for unspecified type scar from previous cesarean delivery: Secondary | ICD-10-CM

## 2017-10-10 DIAGNOSIS — Z3689 Encounter for other specified antenatal screening: Secondary | ICD-10-CM

## 2017-10-10 DIAGNOSIS — O0993 Supervision of high risk pregnancy, unspecified, third trimester: Secondary | ICD-10-CM

## 2017-10-10 DIAGNOSIS — Z6831 Body mass index (BMI) 31.0-31.9, adult: Secondary | ICD-10-CM | POA: Diagnosis not present

## 2017-10-10 DIAGNOSIS — O24113 Pre-existing diabetes mellitus, type 2, in pregnancy, third trimester: Secondary | ICD-10-CM

## 2017-10-10 LAB — POCT URINALYSIS DIPSTICK OB
GLUCOSE, UA: NEGATIVE
POC,PROTEIN,UA: NEGATIVE

## 2017-10-10 LAB — FETAL NONSTRESS TEST

## 2017-10-10 NOTE — Progress Notes (Signed)
Routine Prenatal Care Visit  Subjective  Terri Ho is a 32 y.o. G5P1031 at [redacted]w[redacted]d being seen today for ongoing prenatal care.  She is currently monitored for the following issues for this high-risk pregnancy and has Obesity, unspecified; Obstructive sleep apnea of adult; Type 2 diabetes mellitus ([place]); Vitamin B12 deficiency (non anemic); Vitamin D deficiency; Supervision of high risk pregnancy, antepartum, third trimester; Pre-existing type 2 diabetes mellitus during pregnancy in third trimester; Antepartum bleeding, first trimester; Obesity affecting pregnancy; BMI 31.0-31.9,adult; History of cesarean delivery affecting pregnancy; History of bariatric surgery; and S/P abdominoplasty on their problem list.  ----------------------------------------------------------------------------------- Patient reports no complaints.   Contractions: Not present. Vag. Bleeding: None.  Movement: Present. No leaking of fluid.  ----------------------------------------------------------------------------------- The following portions of the patient's history were reviewed and updated as appropriate: allergies, current medications, past family history, past medical history, past social history, past surgical history and problem list. Problem list updated.   Objective  Blood pressure 120/78, weight 202 lb (91.6 kg), last menstrual period 02/16/2017. Pregravid weight 178 lb (80.7 kg) Total Weight Gain 24 lb (10.9 kg) Body mass index is 34.67 kg/m. Urinalysis: Protein Negative, Glucose Negative Fetal Status: Fetal Heart Rate (bpm): 153 Fundal Height: 34 cm Movement: Present  Presentation: Vertex  General:  Alert, oriented and cooperative. Patient is in no acute distress.  Skin: Skin is warm and dry. No rash noted.   Cardiovascular: Normal heart rate noted  Respiratory: Normal respiratory effort, no problems with respiration noted  Abdomen: Soft, gravid, appropriate for gestational age. Pain/Pressure:  Absent     Pelvic:  Cervical exam deferred        Extremities: Normal range of motion.     Mental Status: Normal mood and affect. Normal behavior. Normal judgment and thought content.   NST Baseline: 150 Variability: moderate Accelerations: present Decelerations: absent Tocometry: not done The patient was monitored for 20 minutes, fetal heart rate tracing was deemed reactive.  Assessment   32 y.o. [id number] at [redacted]w[redacted]d, EDD 11/20/2017 by Ultrasound presenting for routine prenatal visit.  Plan   FIFTH Problems (from 03/28/17 to present)    Problem Noted Resolved   History of bariatric surgery 05/05/2017 by Grotegut, [place], MD No   Obesity affecting pregnancy 04/11/2017 by Will Bonnet, MD No   BMI 31.0-31.9,adult 04/11/2017 by Will Bonnet, MD No   History of cesarean delivery affecting pregnancy 04/11/2017 by Will Bonnet, MD No   Supervision of high risk pregnancy, antepartum, third trimester 03/28/2017 by Rexene Agent, CNM No   Overview Addendum 08/05/2017 10:17 AM by Homero Fellers, MD    [facility] Prenatal Labs  Dating 8wk [place] Blood type: O/Positive/-- (02/22 1504)   Genetic Screen  NIPS: normal XY Antibody:Negative (02/22 1504)  Anatomic [place]  Incomplete Rubella: 3.62 (02/22 1504) Varicella: Immune  GTT  Has diabetes RPR: Non Reactive (02/22 1504)   Rhogam  Not indicated HBsAg: Negative (02/22 1504)   TDaP vaccine                        Flu Shot: HIV: Non Reactive (02/22 1504)   Baby Food  Breast                              GBS:   Contraception IUD? Pap: 06/05/2016, NILM  CBB  Given information   CS/VBAC Would like TOLAC  [x]  discuss risks  Support Person              Pre-existing type 2 diabetes mellitus during pregnancy in third trimester 03/28/2017 by Rexene Agent, CNM No   Overview Addendum 09/01/2017  9:33 AM by Rexene Agent, CNM    Current Diabetic Medications:  Metformin [ ]  Aspirin 81 mg daily after 12 weeks; discontinue after 36  weeks (? A2/B GDM)  Required Referrals for A1GDM or A2GDM: [ ]  Diabetes Education and Testing Supplies [ ]  Nutrition Cousult  For A2/B GDM or higher classes of DM [ ]  Diabetes Education and Testing Supplies [ ]  Nutrition Counsult [ ]  Fetal ECHO after 22-24 weeks  [ ]  Eye exam for retina evaluation  [ ]  Baseline EKG [ ]  US fetal growth every 4 weeks starting at 28 weeks [ ]  Twice weekly NST starting at [redacted] weeks gestation [ ]  Delivery planning contingent on fetal growth, AFI, glycemic control, and other co-morbidities but at least by 39 weeks  Baseline and surveillance labs (pulled in from [facility], refresh links as needed)  Lab Results  Component Value Date   CREATININE 0.63 01/19/2014   AST 25 01/06/2014   ALT 40 01/06/2014   TSH 1.22 01/06/2014   LABPROT 12.9 09/21/2013   Lab Results  Component Value Date   HGBA1C 5.8 09/21/2013   HGBA1C 5.4 10/26/2012   HGBA1C 4.9 06/15/2012    Antenatal Testing Class of DM U/S NST/AFI DELIVERY  Diabetes   A1 - good control - O24.410    A2 - good control - O24.419      A2  - poor control or poor compliance - O24.419, E11.65   (Macrosomia or polyhydramnios) **E11.65 is extra code for poor control**    A2/B - O24.919  and B-C O24.319  Poor control B-C or D-R-F-T - O24.319  or  Type I DM - O24.019  20-38  20-38  20-24-28-32-36   20-24-28-32-35-38//fetal echo  20-24-27-30-33-36-38//fetal echo  40  32//2 x wk  32//2 x wk   32//2 x wk  28//BPP wkly then 32//2 x wk  40  39  PRN   39  PRN           AFI normal at 8.86 cm. NST reactive.  She did not bring her glucose log today. She reports that control is good with readings similar to last visit. Encouraged her to bring log and send through MyChart if possible.  Preterm labor symptoms and general obstetric precautions including but not limited to vaginal bleeding, contractions, leaking of fluid and fetal movement were reviewed.  Return in about 4 days (around  10/14/2017) for ROB with NST.  Avel Sensor, CNM 10/10/2017  12:16 PM

## 2017-10-10 NOTE — Progress Notes (Signed)
ROB- no concerns AFI/NST today

## 2017-10-14 ENCOUNTER — Ambulatory Visit (INDEPENDENT_AMBULATORY_CARE_PROVIDER_SITE_OTHER): Payer: BLUE CROSS/BLUE SHIELD | Admitting: Obstetrics & Gynecology

## 2017-10-14 ENCOUNTER — Encounter: Payer: Self-pay | Admitting: Obstetrics & Gynecology

## 2017-10-14 VITALS — BP 110/60 | Wt 200.0 lb

## 2017-10-14 DIAGNOSIS — O34219 Maternal care for unspecified type scar from previous cesarean delivery: Secondary | ICD-10-CM | POA: Diagnosis not present

## 2017-10-14 DIAGNOSIS — O24113 Pre-existing diabetes mellitus, type 2, in pregnancy, third trimester: Secondary | ICD-10-CM | POA: Diagnosis not present

## 2017-10-14 DIAGNOSIS — O99213 Obesity complicating pregnancy, third trimester: Secondary | ICD-10-CM

## 2017-10-14 DIAGNOSIS — E119 Type 2 diabetes mellitus without complications: Secondary | ICD-10-CM

## 2017-10-14 DIAGNOSIS — Z3A34 34 weeks gestation of pregnancy: Secondary | ICD-10-CM | POA: Diagnosis not present

## 2017-10-14 DIAGNOSIS — O0993 Supervision of high risk pregnancy, unspecified, third trimester: Secondary | ICD-10-CM

## 2017-10-14 LAB — FETAL NONSTRESS TEST

## 2017-10-14 NOTE — Progress Notes (Signed)
  Subjective  Fetal Movement? yes Contractions? Yes- occas Braxton Hicks Ctx Leaking Fluid? no Vaginal Bleeding? no  Objective  BP 110/60   Wt 200 lb (90.7 kg)   LMP 02/16/2017 (Approximate)   BMI 34.33 kg/m  General: NAD Pumonary: no increased work of breathing Abdomen: gravid, non-tender Extremities: no edema Psychiatric: mood appropriate, affect full  Assessment  32 y.o. [id number] at [redacted]w[redacted]d by  11/20/2017, by Ultrasound presenting for routine prenatal visit  Plan   Problem List Items Addressed This Visit      Endocrine   Type 2 diabetes mellitus ([place])     Other   Supervision of high risk pregnancy, antepartum, third trimester   Obesity affecting pregnancy   History of cesarean delivery affecting pregnancy    Other Visit Diagnoses    [redacted] weeks gestation of pregnancy    -  Primary     [facility] Prenatal Labs  Dating 8wk [place] Blood type: O/Positive/-- (02/22 1504)   Genetic Screen  NIPS: normal XY Antibody:Negative (02/22 1504)  Anatomic [place]  Complete Rubella: 3.62 (02/22 1504) Varicella: Immune  GTT  Has diabetes RPR: Non Reactive (02/22 1504)   Rhogam  Not indicated HBsAg: Negative (02/22 1504)   TDaP vaccine    8/12 Flu Shot: work HIV: Non Reactive (02/22 1504)   Baby Food  Breast                              GBS:   Contraception IUD? Pap: 06/05/2016, NILM  CBB  Given information   CS/VBAC Would like TOLAC  [x]  discuss risks   A NST procedure was performed with FHR monitoring and a normal baseline established, appropriate time of 20-40 minutes of evaluation, and accels >2 seen w 15x15 characteristics.  Results show a REACTIVE NST.   BS WNL this week (log reviewed)  Barnett Applebaum, MD, [facility], [facility] 10/14/2017  10:20 AM

## 2017-10-14 NOTE — Patient Instructions (Signed)
Nonstress Test The nonstress test is a procedure that monitors the fetus's heartbeat. The test will monitor the heartbeat when the fetus is at rest and while the fetus is moving. In a healthy fetus, there will be an increase in fetal heart rate when the fetus moves or kicks. The heart rate will decrease at rest. This test helps determine if the fetus is healthy. Your health care provider will look at a number of patterns in the heart rate tracing to make sure your baby is thriving. If there is concern, your health care provider may order additional tests or may suggest another course of action. This test is often done in the third trimester and can help determine if an early delivery is needed and safe. Common reasons to have this test are:  You are past your due date.  You have a high-risk pregnancy.  You are feeling less movement than normal.  You have lost a pregnancy in the past.  Your health care provider suspects fetal growth problems.  You have too much or too little amniotic fluid.  What happens before the procedure?  Eat a meal right before the test or as directed by your health care provider. Food may help stimulate fetal movements.  Use the restroom right before the test. What happens during the procedure?  Two belts will be placed around your abdomen. These belts have monitors attached to them. One records the fetal heart rate and the other records uterine contractions.  You may be asked to lie down on your side or to stay sitting upright.  You may be given a button to press when you feel movement.  The fetal heartbeat is listened to and watched on a screen. The heartbeat is recorded on a sheet of paper.  If the fetus seems to be sleeping, you may be asked to drink some juice or soda, gently press your abdomen, or make some noise to wake the fetus. What happens after the procedure? Your health care provider will discuss the test results with you and make recommendations  for the near future.  This information is not intended to replace advice given to you by your health care provider. Make sure you discuss any questions you have with your health care provider. This information is not intended to replace advice given to you by your health care provider. Make sure you discuss any questions you have with your health care provider. Document Released: 01/11/2002 Document Revised: 12/22/2015 Document Reviewed: 02/25/2012 Elsevier Interactive Patient Education  2018 Elsevier Inc.  

## 2017-10-17 ENCOUNTER — Encounter: Payer: Self-pay | Admitting: Maternal Newborn

## 2017-10-17 ENCOUNTER — Ambulatory Visit (INDEPENDENT_AMBULATORY_CARE_PROVIDER_SITE_OTHER): Payer: BLUE CROSS/BLUE SHIELD | Admitting: Maternal Newborn

## 2017-10-17 ENCOUNTER — Ambulatory Visit (INDEPENDENT_AMBULATORY_CARE_PROVIDER_SITE_OTHER): Payer: BLUE CROSS/BLUE SHIELD

## 2017-10-17 VITALS — BP 122/60 | Wt 200.0 lb

## 2017-10-17 DIAGNOSIS — Z3A35 35 weeks gestation of pregnancy: Secondary | ICD-10-CM | POA: Diagnosis not present

## 2017-10-17 DIAGNOSIS — O99213 Obesity complicating pregnancy, third trimester: Secondary | ICD-10-CM

## 2017-10-17 DIAGNOSIS — O34219 Maternal care for unspecified type scar from previous cesarean delivery: Secondary | ICD-10-CM | POA: Diagnosis not present

## 2017-10-17 DIAGNOSIS — O24113 Pre-existing diabetes mellitus, type 2, in pregnancy, third trimester: Secondary | ICD-10-CM | POA: Diagnosis not present

## 2017-10-17 DIAGNOSIS — O0993 Supervision of high risk pregnancy, unspecified, third trimester: Secondary | ICD-10-CM

## 2017-10-17 DIAGNOSIS — Z3689 Encounter for other specified antenatal screening: Secondary | ICD-10-CM

## 2017-10-17 LAB — FETAL NONSTRESS TEST

## 2017-10-17 LAB — POCT URINALYSIS DIPSTICK OB
GLUCOSE, UA: NEGATIVE
POC,PROTEIN,UA: NEGATIVE

## 2017-10-17 NOTE — Progress Notes (Signed)
Routine Prenatal Care Visit  Subjective  Terri Ho is a 32 y.o. G5P1031 at [redacted]w[redacted]d being seen today for ongoing prenatal care.  She is currently monitored for the following issues for this high-risk pregnancy and has Obesity, unspecified; Obstructive sleep apnea of adult; Type 2 diabetes mellitus ([place]); Vitamin B12 deficiency (non anemic); Vitamin D deficiency; Supervision of high risk pregnancy, antepartum, third trimester; Pre-existing type 2 diabetes mellitus during pregnancy in third trimester; Obesity affecting pregnancy; BMI 31.0-31.9,adult; History of cesarean delivery affecting pregnancy; History of bariatric surgery; and S/P abdominoplasty on their problem list.  ----------------------------------------------------------------------------------- Patient reports no complaints.   Contractions: Not present. Vag. Bleeding: None.  Movement: Present. No leaking of fluid.  ----------------------------------------------------------------------------------- The following portions of the patient's history were reviewed and updated as appropriate: allergies, current medications, past family history, past medical history, past social history, past surgical history and problem list. Problem list updated.   Objective  Blood pressure 122/60, weight 200 lb (90.7 kg), last menstrual period 02/16/2017. Pregravid weight 178 lb (80.7 kg) Total Weight Gain 22 lb (9.979 kg)  Body mass index is 34.33 kg/m. Urinalysis: Protein Negative, Glucose Negative Fetal Status: Fetal Heart Rate (bpm): 145   Movement: Present  Presentation: Vertex  General:  Alert, oriented and cooperative. Patient is in no acute distress.  Skin: Skin is warm and dry. No rash noted.   Cardiovascular: Normal heart rate noted  Respiratory: Normal respiratory effort, no problems with respiration noted  Abdomen: Soft, gravid, appropriate for gestational age. Pain/Pressure: Present     Pelvic:  Cervical exam deferred          Extremities: Normal range of motion.  Edema: None  Mental Status: Normal mood and affect. Normal behavior. Normal judgment and thought content.   NST Baseline: 145 Variability: moderate Accelerations: present Decelerations: absent Tocometry: not done The patient was monitored for 20+ minutes, fetal heart rate tracing was deemed reactive.  Assessment   32 y.o. [id number] at [redacted]w[redacted]d, EDD 11/20/2017 by Ultrasound presenting for a routine prenatal visit.  Plan   FIFTH Problems (from 03/28/17 to present)    Problem Noted Resolved   History of bariatric surgery 05/05/2017 by Grotegut, [place], MD No   Obesity affecting pregnancy 04/11/2017 by Will Bonnet, MD No   BMI 31.0-31.9,adult 04/11/2017 by Will Bonnet, MD No   History of cesarean delivery affecting pregnancy 04/11/2017 by Will Bonnet, MD No   Supervision of high risk pregnancy, antepartum, third trimester 03/28/2017 by Rexene Agent, CNM No   Overview Addendum 08/05/2017 10:17 AM by Homero Fellers, MD    [facility] Prenatal Labs  Dating 8wk [place] Blood type: O/Positive/-- (02/22 1504)   Genetic Screen  NIPS: normal XY Antibody:Negative (02/22 1504)  Anatomic [place]  Incomplete Rubella: 3.62 (02/22 1504) Varicella: Immune  GTT  Has diabetes RPR: Non Reactive (02/22 1504)   Rhogam  Not indicated HBsAg: Negative (02/22 1504)   TDaP vaccine                        Flu Shot: HIV: Non Reactive (02/22 1504)   Baby Food  Breast                              GBS:   Contraception IUD? Pap: 06/05/2016, NILM  CBB  Given information   CS/VBAC Would like TOLAC  [x]  discuss risks   Support Person  Pre-existing type 2 diabetes mellitus during pregnancy in third trimester 03/28/2017 by Rexene Agent, CNM No   Overview Addendum 09/01/2017  9:33 AM by Rexene Agent, CNM    Current Diabetic Medications:  Metformin [ ]  Aspirin 81 mg daily after 12 weeks; discontinue after 36 weeks (? A2/B GDM)  Required  Referrals for A1GDM or A2GDM: [ ]  Diabetes Education and Testing Supplies [ ]  Nutrition Cousult  For A2/B GDM or higher classes of DM [ ]  Diabetes Education and Testing Supplies [ ]  Nutrition Counsult [ ]  Fetal ECHO after 22-24 weeks  [ ]  Eye exam for retina evaluation  [ ]  Baseline EKG [ ]  US fetal growth every 4 weeks starting at 28 weeks [ ]  Twice weekly NST starting at [redacted] weeks gestation [ ]  Delivery planning contingent on fetal growth, AFI, glycemic control, and other co-morbidities but at least by 39 weeks  Baseline and surveillance labs (pulled in from [facility], refresh links as needed)  Lab Results  Component Value Date   CREATININE 0.63 01/19/2014   AST 25 01/06/2014   ALT 40 01/06/2014   TSH 1.22 01/06/2014   LABPROT 12.9 09/21/2013   Lab Results  Component Value Date   HGBA1C 5.8 09/21/2013   HGBA1C 5.4 10/26/2012   HGBA1C 4.9 06/15/2012    Antenatal Testing Class of DM U/S NST/AFI DELIVERY  Diabetes   A1 - good control - O24.410    A2 - good control - O24.419      A2  - poor control or poor compliance - O24.419, E11.65   (Macrosomia or polyhydramnios) **E11.65 is extra code for poor control**    A2/B - O24.919  and B-C O24.319  Poor control B-C or D-R-F-T - O24.319  or  Type I DM - O24.019  20-38  20-38  20-24-28-32-36   20-24-28-32-35-38//fetal echo  20-24-27-30-33-36-38//fetal echo  40  32//2 x wk  32//2 x wk   32//2 x wk  28//BPP wkly then 32//2 x wk  40  39  PRN   39  PRN           Growth scan today: 30th percentile, EFW 5 lb 4 oz. AFI 12.18 cm.  Glucose log reviewed. 10/11 fasting values normal, range 73-96. 28/31 postprandial values normal, range 77-122.  Preterm labor symptoms and general obstetric precautions including but not limited to vaginal bleeding, contractions, leaking of fluid and fetal movement were reviewed.  Return in about 1 week (around 10/24/2017) for [place] with NST/AFI.  Avel Sensor, CNM 10/17/2017   2:22 PM

## 2017-10-17 NOTE — Progress Notes (Signed)
Growth/AFI/NST- has been having discharge since a week ago "clear", had itchiness for one day only

## 2017-10-24 ENCOUNTER — Encounter: Payer: Self-pay | Admitting: Maternal Newborn

## 2017-10-24 ENCOUNTER — Ambulatory Visit (INDEPENDENT_AMBULATORY_CARE_PROVIDER_SITE_OTHER): Payer: BLUE CROSS/BLUE SHIELD | Admitting: Obstetrics and Gynecology

## 2017-10-24 ENCOUNTER — Telehealth: Payer: Self-pay

## 2017-10-24 ENCOUNTER — Ambulatory Visit (INDEPENDENT_AMBULATORY_CARE_PROVIDER_SITE_OTHER): Payer: BLUE CROSS/BLUE SHIELD

## 2017-10-24 VITALS — BP 112/68 | Wt 200.0 lb

## 2017-10-24 DIAGNOSIS — O24113 Pre-existing diabetes mellitus, type 2, in pregnancy, third trimester: Secondary | ICD-10-CM

## 2017-10-24 DIAGNOSIS — O34219 Maternal care for unspecified type scar from previous cesarean delivery: Secondary | ICD-10-CM

## 2017-10-24 DIAGNOSIS — Z3A36 36 weeks gestation of pregnancy: Secondary | ICD-10-CM

## 2017-10-24 DIAGNOSIS — Z9889 Other specified postprocedural states: Secondary | ICD-10-CM

## 2017-10-24 DIAGNOSIS — Z9884 Bariatric surgery status: Secondary | ICD-10-CM

## 2017-10-24 DIAGNOSIS — O0993 Supervision of high risk pregnancy, unspecified, third trimester: Secondary | ICD-10-CM

## 2017-10-24 DIAGNOSIS — E6609 Other obesity due to excess calories: Secondary | ICD-10-CM

## 2017-10-24 DIAGNOSIS — Z6831 Body mass index (BMI) 31.0-31.9, adult: Secondary | ICD-10-CM

## 2017-10-24 DIAGNOSIS — Z3685 Encounter for antenatal screening for Streptococcus B: Secondary | ICD-10-CM

## 2017-10-24 LAB — POCT URINALYSIS DIPSTICK OB
Glucose, UA: NEGATIVE
PROTEIN: NEGATIVE

## 2017-10-24 NOTE — Progress Notes (Signed)
Routine Prenatal Care Visit  Subjective  Terri Ho is a 32 y.o. G5P1031 at [redacted]w[redacted]d being seen today for ongoing prenatal care.  She is currently monitored for the following issues for this high-risk pregnancy and has Obesity, unspecified; Obstructive sleep apnea of adult; Type 2 diabetes mellitus ([place]); Vitamin B12 deficiency (non anemic); Vitamin D deficiency; Supervision of high risk pregnancy, antepartum, third trimester; Pre-existing type 2 diabetes mellitus during pregnancy in third trimester; Obesity affecting pregnancy; BMI 31.0-31.9,adult; History of cesarean delivery affecting pregnancy; History of bariatric surgery; and S/P abdominoplasty on their problem list.  ----------------------------------------------------------------------------------- Patient reports no complaints.   Contractions: Not present. Vag. Bleeding: None.  Movement: Present. Denies leaking of fluid.  ----------------------------------------------------------------------------------- The following portions of the patient's history were reviewed and updated as appropriate: allergies, current medications, past family history, past medical history, past social history, past surgical history and problem list. Problem list updated.   Objective  Blood pressure 112/68, weight 200 lb (90.7 kg), last menstrual period 02/16/2017. Pregravid weight 178 lb (80.7 kg) Total Weight Gain 22 lb (9.979 kg) Urinalysis:      Fetal Status: Fetal Heart Rate (bpm): 145   Movement: Present  Presentation: Vertex  General:  Alert, oriented and cooperative. Patient is in no acute distress.  Skin: Skin is warm and dry. No rash noted.   Cardiovascular: Normal heart rate noted  Respiratory: Normal respiratory effort, no problems with respiration noted  Abdomen: Soft, gravid, appropriate for gestational age. Pain/Pressure: Present     Pelvic:  Cervical exam performed Dilation: Fingertip    narrow pelvic outlet  Extremities: Normal range  of motion.     ental Status: Normal mood and affect. Normal behavior. Normal judgment and thought content.   Baseline: 145 Variability: moderate Accelerations: present Decelerations: absent Tocometry: N/A The patient was monitored for 30 minutes, fetal heart rate tracing was deemed reactive, category I tracing,  Assessment   32 y.o. G2R4270 at [redacted]w[redacted]d by  11/20/2017, by Ultrasound presenting for routine prenatal visit  Plan   FIFTH Problems (from 03/28/17 to present)    Problem Noted Resolved   History of bariatric surgery 05/05/2017 by Grotegut, [place], MD No   Obesity affecting pregnancy 04/11/2017 by Will Bonnet, MD No   BMI 31.0-31.9,adult 04/11/2017 by Will Bonnet, MD No   History of cesarean delivery affecting pregnancy 04/11/2017 by Will Bonnet, MD No   Supervision of high risk pregnancy, antepartum, third trimester 03/28/2017 by Rexene Agent, CNM No   Overview Addendum 10/27/2017  9:07 AM by Malachy Mood, MD    [facility]inic [facility] Prenatal Labs  Dating 8wk [place] Blood type: O/Positive/-- (02/22 1504)   Genetic Screen  NIPS: normal XY Antibody:Negative (02/22 1504)  Anatomic [place]  Incomplete Rubella: 3.62 (02/22 1504) Varicella: Immune  GTT  Has diabetes RPR: Non Reactive (02/22 1504)   Rhogam  Not indicated HBsAg: Negative (02/22 1504)   TDaP vaccine  09/15/17  Flu Shot: HIV: Non Reactive (02/22 1504)   Baby Food  Breast                              GBS: Negative  Contraception IUD? Pap: 06/05/2016, NILM  CBB  Given information   CS/VBAC Would like TOLAC  [x]  discuss risks   Support Person              Pre-existing type 2 diabetes mellitus during pregnancy in third trimester 03/28/2017 by  Rexene Agent, CNM No   Overview Addendum 09/01/2017  9:33 AM by Rexene Agent, CNM    Current Diabetic Medications:  Metformin [ ]  Aspirin 81 mg daily after 12 weeks; discontinue after 36 weeks (? A2/B GDM)  Required Referrals for A1GDM or A2GDM: [ ]  Diabetes  Education and Testing Supplies [ ]  Nutrition Cousult  For A2/B GDM or higher classes of DM [ ]  Diabetes Education and Testing Supplies [ ]  Nutrition Counsult [ ]  Fetal ECHO after 22-24 weeks  [ ]  Eye exam for retina evaluation  [ ]  Baseline EKG [ ]  US fetal growth every 4 weeks starting at 28 weeks [ ]  Twice weekly NST starting at [redacted] weeks gestation [ ]  Delivery planning contingent on fetal growth, AFI, glycemic control, and other co-morbidities but at least by 39 weeks  Baseline and surveillance labs (pulled in from [facility], refresh links as needed)  Lab Results  Component Value Date   CREATININE 0.63 01/19/2014   AST 25 01/06/2014   ALT 40 01/06/2014   TSH 1.22 01/06/2014   LABPROT 12.9 09/21/2013   Lab Results  Component Value Date   HGBA1C 5.8 09/21/2013   HGBA1C 5.4 10/26/2012   HGBA1C 4.9 06/15/2012    Antenatal Testing Class of DM U/S NST/AFI DELIVERY  Diabetes   A1 - good control - O24.410    A2 - good control - O24.419      A2  - poor control or poor compliance - O24.419, E11.65   (Macrosomia or polyhydramnios) **E11.65 is extra code for poor control**    A2/B - O24.919  and B-C O24.319  Poor control B-C or D-R-F-T - O24.319  or  Type I DM - O24.019  20-38  20-38  20-24-28-32-36   20-24-28-32-35-38//fetal echo  20-24-27-30-33-36-38//fetal echo  40  32//2 x wk  32//2 x wk   32//2 x wk  28//BPP wkly then 32//2 x wk  40  39  PRN   39  PRN             Immunization History  Administered Date(s) Administered  . Tdap 09/15/2017    Gestational age appropriate obstetric precautions including but not limited to vaginal bleeding, contractions, leaking of fluid and fetal movement were reviewed in detail with the patient.   - GBS - TOLAC discussed in setting of DMII recommendation would be delivery around 39 weeks.  If not in labor my recommendation would be Cesarean section.  I do feel she has a narrow pelvic outlet on exam  today  Return in about 1 week (around 10/31/2017) for ROB/NST/AFI MD only.  Malachy Mood, MD, [facility], [place]

## 2017-10-24 NOTE — Telephone Encounter (Signed)
FMLA/DISABILITY forms for [employer] filled out, signature obtained and given to [place] for processing.

## 2017-10-24 NOTE — Progress Notes (Signed)
ROB AFI/NST 

## 2017-10-26 LAB — STREP GP B NAA: STREP GROUP B AG: NEGATIVE

## 2017-10-30 ENCOUNTER — Ambulatory Visit (INDEPENDENT_AMBULATORY_CARE_PROVIDER_SITE_OTHER): Payer: BLUE CROSS/BLUE SHIELD

## 2017-10-30 ENCOUNTER — Encounter: Payer: BLUE CROSS/BLUE SHIELD | Admitting: Obstetrics and Gynecology

## 2017-10-30 DIAGNOSIS — Z3A36 36 weeks gestation of pregnancy: Secondary | ICD-10-CM

## 2017-10-30 DIAGNOSIS — O34219 Maternal care for unspecified type scar from previous cesarean delivery: Secondary | ICD-10-CM

## 2017-10-30 DIAGNOSIS — O0993 Supervision of high risk pregnancy, unspecified, third trimester: Secondary | ICD-10-CM

## 2017-10-30 DIAGNOSIS — O24113 Pre-existing diabetes mellitus, type 2, in pregnancy, third trimester: Secondary | ICD-10-CM | POA: Diagnosis not present

## 2017-10-30 DIAGNOSIS — Z6831 Body mass index (BMI) 31.0-31.9, adult: Secondary | ICD-10-CM

## 2017-10-30 DIAGNOSIS — E6609 Other obesity due to excess calories: Secondary | ICD-10-CM

## 2017-11-03 ENCOUNTER — Inpatient Hospital Stay: Payer: BLUE CROSS/BLUE SHIELD | Admitting: Anesthesiology

## 2017-11-03 ENCOUNTER — Encounter
Admission: EM | Disposition: A | Discharge status: Home / Self Care | Payer: Self-pay | Source: Home / Self Care | Attending: Obstetrics and Gynecology

## 2017-11-03 ENCOUNTER — Encounter

## 2017-11-03 ENCOUNTER — Inpatient Hospital Stay
Admission: EM | Admit: 2017-11-03 | Discharge: 2017-11-05 | DRG: 786 | Disposition: A | Discharge status: Home / Self Care | Payer: BLUE CROSS/BLUE SHIELD | Attending: Obstetrics and Gynecology | Admitting: Obstetrics and Gynecology

## 2017-11-03 ENCOUNTER — Encounter: Payer: BLUE CROSS/BLUE SHIELD | Admitting: Obstetrics and Gynecology

## 2017-11-03 ENCOUNTER — Other Ambulatory Visit: Payer: Self-pay

## 2017-11-03 DIAGNOSIS — Z23 Encounter for immunization: Secondary | ICD-10-CM

## 2017-11-03 DIAGNOSIS — O99844 Bariatric surgery status complicating childbirth: Secondary | ICD-10-CM | POA: Diagnosis present

## 2017-11-03 DIAGNOSIS — O34211 Maternal care for low transverse scar from previous cesarean delivery: Secondary | ICD-10-CM | POA: Diagnosis present

## 2017-11-03 DIAGNOSIS — O99214 Obesity complicating childbirth: Secondary | ICD-10-CM | POA: Diagnosis present

## 2017-11-03 DIAGNOSIS — O24113 Pre-existing diabetes mellitus, type 2, in pregnancy, third trimester: Secondary | ICD-10-CM | POA: Diagnosis present

## 2017-11-03 DIAGNOSIS — O34219 Maternal care for unspecified type scar from previous cesarean delivery: Secondary | ICD-10-CM | POA: Diagnosis present

## 2017-11-03 DIAGNOSIS — O0993 Supervision of high risk pregnancy, unspecified, third trimester: Secondary | ICD-10-CM

## 2017-11-03 DIAGNOSIS — O9081 Anemia of the puerperium: Secondary | ICD-10-CM | POA: Diagnosis not present

## 2017-11-03 DIAGNOSIS — Z7984 Long term (current) use of oral hypoglycemic drugs: Secondary | ICD-10-CM

## 2017-11-03 DIAGNOSIS — Z9884 Bariatric surgery status: Secondary | ICD-10-CM

## 2017-11-03 DIAGNOSIS — O2412 Pre-existing diabetes mellitus, type 2, in childbirth: Secondary | ICD-10-CM | POA: Diagnosis present

## 2017-11-03 DIAGNOSIS — O99210 Obesity complicating pregnancy, unspecified trimester: Secondary | ICD-10-CM | POA: Diagnosis present

## 2017-11-03 DIAGNOSIS — E119 Type 2 diabetes mellitus without complications: Secondary | ICD-10-CM | POA: Diagnosis present

## 2017-11-03 DIAGNOSIS — D62 Acute posthemorrhagic anemia: Secondary | ICD-10-CM | POA: Diagnosis not present

## 2017-11-03 DIAGNOSIS — E669 Obesity, unspecified: Secondary | ICD-10-CM | POA: Diagnosis present

## 2017-11-03 DIAGNOSIS — O99213 Obesity complicating pregnancy, third trimester: Secondary | ICD-10-CM

## 2017-11-03 DIAGNOSIS — Z3A37 37 weeks gestation of pregnancy: Secondary | ICD-10-CM

## 2017-11-03 DIAGNOSIS — O4292 Full-term premature rupture of membranes, unspecified as to length of time between rupture and onset of labor: Principal | ICD-10-CM | POA: Diagnosis present

## 2017-11-03 DIAGNOSIS — Z9889 Other specified postprocedural states: Secondary | ICD-10-CM

## 2017-11-03 DIAGNOSIS — Z6831 Body mass index (BMI) 31.0-31.9, adult: Secondary | ICD-10-CM

## 2017-11-03 DIAGNOSIS — O4202 Full-term premature rupture of membranes, onset of labor within 24 hours of rupture: Secondary | ICD-10-CM | POA: Diagnosis not present

## 2017-11-03 DIAGNOSIS — O4290 Premature rupture of membranes, unspecified as to length of time between rupture and onset of labor, unspecified weeks of gestation: Secondary | ICD-10-CM | POA: Diagnosis present

## 2017-11-03 LAB — CBC
HEMATOCRIT: 30.2 % — AB (ref 35.0–47.0)
Hemoglobin: 10.1 g/dL — ABNORMAL LOW (ref 12.0–16.0)
MCH: 27.4 pg (ref 26.0–34.0)
MCHC: 33.6 g/dL (ref 32.0–36.0)
MCV: 81.5 fL (ref 80.0–100.0)
PLATELETS: 220 10*3/uL (ref 150–440)
RBC: 3.7 MIL/uL — ABNORMAL LOW (ref 3.80–5.20)
RDW: 14.8 % — ABNORMAL HIGH (ref 11.5–14.5)
WBC: 6.5 10*3/uL (ref 3.6–11.0)

## 2017-11-03 LAB — GLUCOSE, CAPILLARY
GLUCOSE-CAPILLARY: 123 mg/dL — AB (ref 70–99)
Glucose-Capillary: 82 mg/dL (ref 70–99)
Glucose-Capillary: 99 mg/dL (ref 70–99)
Glucose-Capillary: 134 mg/dL — ABNORMAL HIGH (ref 70–99)

## 2017-11-03 LAB — TYPE AND SCREEN
ABO/RH(D): O POS
Antibody Screen: NEGATIVE

## 2017-11-03 SURGERY — Surgical Case
Anesthesia: Epidural

## 2017-11-03 MED ORDER — BUPIVACAINE HCL (PF) 0.5 % IJ SOLN
10.0000 mL | Freq: Once | INTRAMUSCULAR | Status: DC
  Filled 2017-11-03: qty 30

## 2017-11-03 MED ORDER — BUPIVACAINE HCL (PF) 0.5 % IJ SOLN
INTRAMUSCULAR | Status: DC | PRN
  Administered 2017-11-03 (×2): 1 mL
  Administered 2017-11-03: 3 mL
  Administered 2017-11-03: 1 mL
  Administered 2017-11-03: 2 mL

## 2017-11-03 MED ORDER — ONDANSETRON HCL 4 MG/2ML IJ SOLN
INTRAMUSCULAR | Status: AC
  Filled 2017-11-03: qty 2

## 2017-11-03 MED ORDER — OXYTOCIN 40 UNITS IN LACTATED RINGERS INFUSION - SIMPLE MED
INTRAVENOUS | Status: AC
  Filled 2017-11-03: qty 1000

## 2017-11-03 MED ORDER — PHENYLEPHRINE HCL 10 MG/ML IJ SOLN
INTRAMUSCULAR | Status: AC
  Filled 2017-11-03: qty 1

## 2017-11-03 MED ORDER — KETOROLAC TROMETHAMINE 30 MG/ML IJ SOLN
INTRAMUSCULAR | Status: AC
  Filled 2017-11-03: qty 1

## 2017-11-03 MED ORDER — ONDANSETRON HCL 4 MG/2ML IJ SOLN: 4.0000 mg | Freq: Four times a day (QID) | INTRAMUSCULAR | Status: DC | PRN

## 2017-11-03 MED ORDER — LIDOCAINE HCL (PF) 2 % IJ SOLN
INTRAMUSCULAR | Status: DC | PRN
  Administered 2017-11-03 (×4): 100 mg via INTRADERMAL

## 2017-11-03 MED ORDER — OXYCODONE-ACETAMINOPHEN 5-325 MG PO TABS: 1.0000 | ORAL_TABLET | ORAL | Status: DC | PRN

## 2017-11-03 MED ORDER — DIBUCAINE 1 % RE OINT: 1.0000 "application " | TOPICAL_OINTMENT | RECTAL | Status: DC | PRN

## 2017-11-03 MED ORDER — LIDOCAINE HCL 2 % IJ SOLN
INTRAMUSCULAR | Status: AC
  Filled 2017-11-03: qty 20

## 2017-11-03 MED ORDER — FENTANYL CITRATE (PF) 100 MCG/2ML IJ SOLN
INTRAMUSCULAR | Status: AC
  Filled 2017-11-03: qty 2

## 2017-11-03 MED ORDER — KETOROLAC TROMETHAMINE 30 MG/ML IJ SOLN
INTRAMUSCULAR | Status: DC | PRN
  Administered 2017-11-03: 30 mg via INTRAVENOUS

## 2017-11-03 MED ORDER — SIMETHICONE 80 MG PO CHEW
80.0000 mg | CHEWABLE_TABLET | Freq: Three times a day (TID) | ORAL | Status: DC
  Administered 2017-11-03 – 2017-11-05 (×5): 80 mg via ORAL
  Filled 2017-11-03 (×5): qty 1

## 2017-11-03 MED ORDER — FENTANYL CITRATE (PF) 100 MCG/2ML IJ SOLN
INTRAMUSCULAR | Status: DC | PRN
  Administered 2017-11-03: 50 ug via EPIDURAL

## 2017-11-03 MED ORDER — BUPIVACAINE HCL (PF) 0.25 % IJ SOLN
INTRAMUSCULAR | Status: DC | PRN
  Administered 2017-11-03 (×2): 4 mL via EPIDURAL

## 2017-11-03 MED ORDER — BUPIVACAINE HCL (PF) 0.5 % IJ SOLN
INTRAMUSCULAR | Status: AC
  Filled 2017-11-03: qty 30

## 2017-11-03 MED ORDER — MORPHINE SULFATE (PF) 0.5 MG/ML IJ SOLN
INTRAMUSCULAR | Status: AC
  Filled 2017-11-03: qty 10

## 2017-11-03 MED ORDER — MENTHOL 3 MG MT LOZG
1.0000 | LOZENGE | OROMUCOSAL | Status: DC | PRN
  Filled 2017-11-03: qty 9

## 2017-11-03 MED ORDER — PHENYLEPHRINE 40 MCG/ML (10ML) SYRINGE FOR IV PUSH (FOR BLOOD PRESSURE SUPPORT): 80.0000 ug | PREFILLED_SYRINGE | INTRAVENOUS | Status: DC | PRN

## 2017-11-03 MED ORDER — MORPHINE SULFATE (PF) 0.5 MG/ML IJ SOLN
INTRAMUSCULAR | Status: DC | PRN
  Administered 2017-11-03: 3 mg via EPIDURAL

## 2017-11-03 MED ORDER — LACTATED RINGERS IV SOLN
500.0000 mL | Freq: Once | INTRAVENOUS | Status: AC
  Administered 2017-11-03: 500 mL via INTRAVENOUS

## 2017-11-03 MED ORDER — SOD CITRATE-CITRIC ACID 500-334 MG/5ML PO SOLN
30.0000 mL | ORAL | Status: AC
  Administered 2017-11-03: 30 mL via ORAL

## 2017-11-03 MED ORDER — LACTATED RINGERS IV SOLN
500.0000 mL | INTRAVENOUS | Status: DC | PRN
  Administered 2017-11-03: 500 mL via INTRAVENOUS

## 2017-11-03 MED ORDER — PRENATAL MULTIVITAMIN CH
1.0000 | ORAL_TABLET | Freq: Every day | ORAL | Status: DC
  Administered 2017-11-04: 1 via ORAL
  Filled 2017-11-03: qty 1

## 2017-11-03 MED ORDER — ACETAMINOPHEN 325 MG PO TABS: 650.0000 mg | ORAL_TABLET | ORAL | Status: DC | PRN

## 2017-11-03 MED ORDER — OXYTOCIN BOLUS FROM INFUSION: 500.0000 mL | Freq: Once | INTRAVENOUS | Status: DC

## 2017-11-03 MED ORDER — EPHEDRINE SULFATE-NACL 50-0.9 MG/10ML-% IV SOSY
PREFILLED_SYRINGE | INTRAVENOUS | Status: DC | PRN
  Administered 2017-11-03 (×2): 5 mg via INTRAVENOUS

## 2017-11-03 MED ORDER — COCONUT OIL OIL: 1.0000 "application " | TOPICAL_OIL | Status: DC | PRN

## 2017-11-03 MED ORDER — SOD CITRATE-CITRIC ACID 500-334 MG/5ML PO SOLN
ORAL | Status: AC
  Administered 2017-11-03: 30 mL via ORAL
  Filled 2017-11-03: qty 30

## 2017-11-03 MED ORDER — OXYTOCIN 10 UNIT/ML IJ SOLN
INTRAMUSCULAR | Status: AC
  Filled 2017-11-03: qty 2

## 2017-11-03 MED ORDER — AMMONIA AROMATIC IN INHA
RESPIRATORY_TRACT | Status: AC
  Filled 2017-11-03: qty 10

## 2017-11-03 MED ORDER — CEFAZOLIN SODIUM-DEXTROSE 2-4 GM/100ML-% IV SOLN
2.0000 g | INTRAVENOUS | Status: AC
  Administered 2017-11-03: 2 g via INTRAVENOUS
  Filled 2017-11-03: qty 100

## 2017-11-03 MED ORDER — WITCH HAZEL-GLYCERIN EX PADS: 1.0000 "application " | MEDICATED_PAD | CUTANEOUS | Status: DC | PRN

## 2017-11-03 MED ORDER — SUCCINYLCHOLINE CHLORIDE 20 MG/ML IJ SOLN
INTRAMUSCULAR | Status: AC
  Filled 2017-11-03: qty 1

## 2017-11-03 MED ORDER — OXYTOCIN 40 UNITS IN LACTATED RINGERS INFUSION - SIMPLE MED
2.5000 [IU]/h | INTRAVENOUS | Status: AC
  Administered 2017-11-03: 2.5 [IU]/h via INTRAVENOUS

## 2017-11-03 MED ORDER — OXYTOCIN 40 UNITS IN LACTATED RINGERS INFUSION - SIMPLE MED
2.5000 [IU]/h | INTRAVENOUS | Status: DC
  Administered 2017-11-03: 1000 mL via INTRAVENOUS
  Filled 2017-11-03: qty 1000

## 2017-11-03 MED ORDER — DEXAMETHASONE SODIUM PHOSPHATE 10 MG/ML IJ SOLN
INTRAMUSCULAR | Status: DC | PRN
  Administered 2017-11-03: 5 mg via INTRAVENOUS

## 2017-11-03 MED ORDER — LIDOCAINE HCL (PF) 1 % IJ SOLN
INTRAMUSCULAR | Status: AC
  Filled 2017-11-03: qty 30

## 2017-11-03 MED ORDER — TERBUTALINE SULFATE 1 MG/ML IJ SOLN
0.2500 mg | Freq: Once | INTRAMUSCULAR | Status: AC
  Administered 2017-11-03: 0.25 mg via SUBCUTANEOUS

## 2017-11-03 MED ORDER — DIPHENHYDRAMINE HCL 50 MG/ML IJ SOLN: 12.5000 mg | INTRAMUSCULAR | Status: DC | PRN

## 2017-11-03 MED ORDER — TERBUTALINE SULFATE 1 MG/ML IJ SOLN
INTRAMUSCULAR | Status: AC
  Filled 2017-11-03: qty 1

## 2017-11-03 MED ORDER — IBUPROFEN 600 MG PO TABS
600.0000 mg | ORAL_TABLET | Freq: Four times a day (QID) | ORAL | Status: DC
  Administered 2017-11-04 – 2017-11-05 (×3): 600 mg via ORAL
  Filled 2017-11-03 (×3): qty 1

## 2017-11-03 MED ORDER — LACTATED RINGERS IV SOLN
INTRAVENOUS | Status: DC
  Administered 2017-11-03 (×3): via INTRAVENOUS

## 2017-11-03 MED ORDER — MISOPROSTOL 200 MCG PO TABS
ORAL_TABLET | ORAL | Status: AC
  Filled 2017-11-03: qty 4

## 2017-11-03 MED ORDER — DIPHENHYDRAMINE HCL 25 MG PO CAPS
25.0000 mg | ORAL_CAPSULE | Freq: Four times a day (QID) | ORAL | Status: DC | PRN
  Administered 2017-11-04 (×2): 25 mg via ORAL
  Filled 2017-11-03 (×2): qty 1

## 2017-11-03 MED ORDER — SENNOSIDES-DOCUSATE SODIUM 8.6-50 MG PO TABS
2.0000 | ORAL_TABLET | ORAL | Status: DC
  Administered 2017-11-04 – 2017-11-05 (×2): 2 via ORAL
  Filled 2017-11-03 (×4): qty 2

## 2017-11-03 MED ORDER — EPHEDRINE SULFATE 50 MG/ML IJ SOLN
INTRAMUSCULAR | Status: AC
  Filled 2017-11-03: qty 1

## 2017-11-03 MED ORDER — SODIUM CHLORIDE 0.9 % IV SOLN
500.0000 mg | Freq: Once | INTRAVENOUS | Status: AC
  Administered 2017-11-03: 500 mg via INTRAVENOUS
  Filled 2017-11-03: qty 500

## 2017-11-03 MED ORDER — DEXAMETHASONE SODIUM PHOSPHATE 10 MG/ML IJ SOLN
INTRAMUSCULAR | Status: AC
  Filled 2017-11-03: qty 1

## 2017-11-03 MED ORDER — FENTANYL CITRATE (PF) 100 MCG/2ML IJ SOLN: 25.0000 ug | INTRAMUSCULAR | Status: DC | PRN

## 2017-11-03 MED ORDER — ONDANSETRON HCL 4 MG/2ML IJ SOLN
INTRAMUSCULAR | Status: DC | PRN
  Administered 2017-11-03: 4 mg via INTRAVENOUS

## 2017-11-03 MED ORDER — FENTANYL 2.5 MCG/ML W/ROPIVACAINE 0.15% IN NS 100 ML EPIDURAL (ARMC)
EPIDURAL | Status: AC
  Filled 2017-11-03: qty 100

## 2017-11-03 MED ORDER — LACTATED RINGERS IV SOLN
INTRAVENOUS | Status: DC
  Administered 2017-11-04: 03:00:00 via INTRAVENOUS

## 2017-11-03 MED ORDER — EPHEDRINE 5 MG/ML INJ: 10.0000 mg | INTRAVENOUS | Status: DC | PRN

## 2017-11-03 MED ORDER — LIDOCAINE-EPINEPHRINE (PF) 1.5 %-1:200000 IJ SOLN
INTRAMUSCULAR | Status: DC | PRN
  Administered 2017-11-03: 3 mL via PERINEURAL

## 2017-11-03 MED ORDER — OXYCODONE-ACETAMINOPHEN 5-325 MG PO TABS: 2.0000 | ORAL_TABLET | ORAL | Status: DC | PRN

## 2017-11-03 MED ORDER — BUPIVACAINE 0.25 % ON-Q PUMP DUAL CATH 400 ML
400.0000 mL | INJECTION | Status: DC
  Filled 2017-11-03: qty 400

## 2017-11-03 MED ORDER — FERROUS SULFATE 325 (65 FE) MG PO TABS
325.0000 mg | ORAL_TABLET | Freq: Two times a day (BID) | ORAL | Status: DC
  Administered 2017-11-04 – 2017-11-05 (×3): 325 mg via ORAL
  Filled 2017-11-03 (×3): qty 1

## 2017-11-03 MED ORDER — ONDANSETRON HCL 4 MG/2ML IJ SOLN
4.0000 mg | Freq: Once | INTRAMUSCULAR | Status: AC | PRN
  Administered 2017-11-03: 4 mg via INTRAVENOUS
  Filled 2017-11-03: qty 2

## 2017-11-03 MED ORDER — PHENYLEPHRINE HCL 10 MG/ML IJ SOLN
INTRAMUSCULAR | Status: DC | PRN
  Administered 2017-11-03: 200 ug via INTRAVENOUS
  Administered 2017-11-03 (×2): 100 ug via INTRAVENOUS
  Administered 2017-11-03: 50 ug via INTRAVENOUS
  Administered 2017-11-03 (×2): 100 ug via INTRAVENOUS
  Administered 2017-11-03: 50 ug via INTRAVENOUS
  Administered 2017-11-03: 100 ug via INTRAVENOUS

## 2017-11-03 MED ORDER — LIDOCAINE HCL (PF) 1 % IJ SOLN
INTRAMUSCULAR | Status: DC | PRN
  Administered 2017-11-03: 3 mL

## 2017-11-03 MED ORDER — KETOROLAC TROMETHAMINE 30 MG/ML IJ SOLN
30.0000 mg | Freq: Four times a day (QID) | INTRAMUSCULAR | Status: AC | PRN
  Administered 2017-11-04: 30 mg via INTRAVENOUS
  Filled 2017-11-03: qty 1

## 2017-11-03 MED ORDER — PROMETHAZINE HCL 25 MG/ML IJ SOLN
12.5000 mg | INTRAMUSCULAR | Status: DC | PRN
  Administered 2017-11-03: 12.5 mg via INTRAVENOUS
  Filled 2017-11-03: qty 1

## 2017-11-03 MED ORDER — FENTANYL 2.5 MCG/ML W/ROPIVACAINE 0.15% IN NS 100 ML EPIDURAL (ARMC)
12.0000 mL/h | EPIDURAL | Status: DC
  Administered 2017-11-03: 12 mL/h via EPIDURAL

## 2017-11-03 MED ORDER — FENTANYL CITRATE (PF) 100 MCG/2ML IJ SOLN
50.0000 ug | INTRAMUSCULAR | Status: DC | PRN
  Administered 2017-11-03: 50 ug via INTRAVENOUS
  Filled 2017-11-03: qty 2

## 2017-11-03 SURGICAL SUPPLY — 30 items
BAG COUNTER SPONGE EZ (MISCELLANEOUS) ×2 IMPLANT
CANISTER SUCT 3000ML PPV (MISCELLANEOUS) ×3 IMPLANT
CATH KIT ON-Q SILVERSOAK 5IN (CATHETERS) IMPLANT
CHLORAPREP W/TINT 26ML (MISCELLANEOUS) ×6 IMPLANT
CLOSURE WOUND 1/2 X4 (GAUZE/BANDAGES/DRESSINGS) ×1
COUNTER SPONGE BAG EZ (MISCELLANEOUS) ×1
DERMABOND ADVANCED (GAUZE/BANDAGES/DRESSINGS) ×2
DERMABOND ADVANCED .7 DNX12 (GAUZE/BANDAGES/DRESSINGS) ×1 IMPLANT
DRSG OPSITE POSTOP 4X10 (GAUZE/BANDAGES/DRESSINGS) ×3 IMPLANT
DRSG TELFA 3X8 NADH (GAUZE/BANDAGES/DRESSINGS) ×3 IMPLANT
ELECT CAUTERY BLADE 6.4 (BLADE) ×3 IMPLANT
ELECT REM PT RETURN 9FT ADLT (ELECTROSURGICAL) ×3
ELECTRODE REM PT RTRN 9FT ADLT (ELECTROSURGICAL) ×1 IMPLANT
GAUZE SPONGE 4X4 12PLY STRL (GAUZE/BANDAGES/DRESSINGS) ×3 IMPLANT
GLOVE BIO SURGEON STRL SZ7 (GLOVE) ×3 IMPLANT
GLOVE INDICATOR 7.5 STRL GRN (GLOVE) ×3 IMPLANT
GOWN STRL REUS W/ TWL LRG LVL3 (GOWN DISPOSABLE) ×3 IMPLANT
GOWN STRL REUS W/TWL LRG LVL3 (GOWN DISPOSABLE) ×6
NS IRRIG 1000ML POUR BTL (IV SOLUTION) ×3 IMPLANT
PACK C SECTION AR (MISCELLANEOUS) ×3 IMPLANT
PAD OB MATERNITY 4.3X12.25 (PERSONAL CARE ITEMS) ×3 IMPLANT
PAD PREP 24X41 OB/GYN DISP (PERSONAL CARE ITEMS) ×3 IMPLANT
SPONGE LAP 18X18 RF (DISPOSABLE) ×3 IMPLANT
STRIP CLOSURE SKIN 1/2X4 (GAUZE/BANDAGES/DRESSINGS) ×2 IMPLANT
SUT MAXON ABS #0 GS21 30IN (SUTURE) ×3 IMPLANT
SUT MNCRL AB 4-0 PS2 18 (SUTURE) ×3 IMPLANT
SUT PDS AB 1 TP1 96 (SUTURE) ×6 IMPLANT
SUT VIC AB 0 CTX 36 (SUTURE) ×6
SUT VIC AB 0 CTX36XBRD ANBCTRL (SUTURE) ×3 IMPLANT
SUT VIC AB 2-0 CT1 36 (SUTURE) ×3 IMPLANT

## 2017-11-03 NOTE — OB Triage Note (Signed)
Pt G5P1 presents at [redacted]w[redacted]d reporting that her water broke about 0300. Reports it was clear and had no odor. Reports CTX q10 minutes that started about 0345. Reports +FM. States she had prior csection for failure to progress. nitrazine inconclusive. Monitors applied. VSS. Reports no concerns this pregnancy.

## 2017-11-03 NOTE — Transfer of Care (Signed)
Immediate Anesthesia Transfer of Care Note  Patient: Terri Ho  Procedure(s) Performed: CESAREAN SECTION- URGENT (N/A )  Patient Location: PACU and Mother/Baby  Anesthesia Type:Epidural  Level of Consciousness: awake, alert  and oriented  Airway & Oxygen Therapy: Patient Spontanous Breathing and Patient connected to nasal cannula oxygen  Post-op Assessment: Report given to RN and Post -op Vital signs reviewed and stable  Post vital signs: Reviewed and stable  Last Vitals:  Vitals Value Taken Time  BP 109/67 1218  Temp 21F 1218  Pulse 93 1218  Resp 16 1218  SpO2 100% 1218    Last Pain:  Vitals:   11/03/17 0800  TempSrc:   PainSc: 10-Worst pain ever         Complications: No apparent anesthesia complications

## 2017-11-03 NOTE — Anesthesia Procedure Notes (Signed)
Epidural Patient location during procedure: OB Start time: 11/03/2017 8:20 AM End time: 11/03/2017 8:24 AM  Staffing Anesthesiologist: Martha Clan, MD Resident/CRNA: Doreen Salvage, CRNA Performed: resident/CRNA   Preanesthetic Checklist Completed: patient identified, site marked, surgical consent, pre-op evaluation, timeout performed, IV checked, risks and benefits discussed and monitors and equipment checked  Epidural Patient position: sitting Prep: ChloraPrep Patient monitoring: heart rate, continuous pulse ox and blood pressure Approach: midline Location: L4-L5 Injection technique: LOR saline  Needle:  Needle type: Tuohy  Needle gauge: 17 G Needle length: 9 cm and 9 Needle insertion depth: 7.5 cm Catheter type: closed end flexible Catheter size: 19 Gauge Catheter at skin depth: 11.5 cm Test dose: negative and 1.5% lidocaine with Epi 1:200 K  Assessment Sensory level: T10 Events: blood not aspirated, injection not painful, no injection resistance, negative IV test and no paresthesia  Additional Notes 1 attempt Pt. Evaluated and documentation done after procedure finished. Patient identified. Risks/Benefits/Options discussed with patient including but not limited to bleeding, infection, nerve damage, paralysis, failed block, incomplete pain control, headache, blood pressure changes, nausea, vomiting, reactions to medication both or allergic, itching and postpartum back pain. Confirmed with bedside nurse the patient's most recent platelet count. Confirmed with patient that they are not currently taking any anticoagulation, have any bleeding history or any family history of bleeding disorders. Patient expressed understanding and wished to proceed. All questions were answered. Sterile technique was used throughout the entire procedure. Please see nursing notes for vital signs. Test dose was given through epidural catheter and negative prior to continuing to dose epidural or start  infusion. Warning signs of high block given to the patient including shortness of breath, tingling/numbness in hands, complete motor block, or any concerning symptoms with instructions to call for help. Patient was given instructions on fall risk and not to get out of bed. All questions and concerns addressed with instructions to call with any issues or inadequate analgesia.   Patient tolerated the insertion well without immediate complications.Reason for block:procedure for pain

## 2017-11-03 NOTE — Discharge Summary (Signed)
OB Discharge Summary     Patient Name: Terri Ho DOB: 02/21/1985 MRN: [id number]  Date of admission: 11/03/2017 Delivering MD: Stephen Jackson, MD  Date of Delivery: 11/03/2017  Date of discharge: 11/05/2017  Admitting diagnosis: 37wks contractions, spontaneous rupture of membranes with onset of labor Intrauterine pregnancy: [redacted]w[redacted]d     Secondary diagnosis: Type II Diabetes Mellitus     Discharge diagnosis: Term Pregnancy Delivered and Type 2 DM                                                                                                Post partum procedures:none  Augmentation: none  Complications: None  Hospital course:  Onset of Labor With Unplanned C/S  32 y.o. yo G5P1031 at [redacted]w[redacted]d was admitted in Active Labor on 11/03/2017. Patient had a labor course significant for repetitive variable and late decelerations with marked variability. Membrane Rupture Time/Date: 3:00 AM ,11/03/2017   The patient went for cesarean section due to fetal intolerance of labor, and delivered a Viable infant,11/03/2017  Details of operation can be found in separate operative note. Patient had an uncomplicated postpartum course.  She is ambulating,tolerating a regular diet, passing flatus, and urinating well.  Patient is discharged home in stable condition 11/05/17.  Physical exam  Vitals:   11/04/17 1242 11/04/17 2003 11/05/17 0108 11/05/17 0740  BP: 110/79 107/76 114/70 114/66  Pulse: 91 86 90 99  Resp: 18 18 18 20  Temp: 98.3 F (36.8 C) 97.8 F (36.6 C) 98.2 F (36.8 C) 98.4 F (36.9 C)  TempSrc: Oral Oral Oral Oral  SpO2:  99% 100% 97%  Weight:      Height:       General: alert, cooperative and no distress Lochia: appropriate Uterine Fundus: firm Incision: Healing well with no significant drainage, Dressing is clean, dry, and intact DVT Evaluation: No evidence of DVT seen on physical exam.  Labs: Lab Results  Component Value Date   WBC 13.4 (H) 11/04/2017   HGB 9.5 (L) 11/04/2017   HCT 29.0 (L) 11/04/2017   MCV 81.9 11/04/2017   PLT 242 11/04/2017    Discharge instruction: per After Visit Summary.  Medications:  Allergies as of 11/05/2017   No Known Allergies     Medication List    STOP taking these medications   CITRANATAL BLOOM 90-1 MG Tabs   ferrous sulfate 325 (65 FE) MG tablet   folic acid 800 MCG tablet Commonly known as:  FOLVITE     TAKE these medications   ACCU-CHEK NANO SMARTVIEW w/Device Kit 1 kit by Subdermal route as directed. Check blood sugars for fasting, and two hours after breakfast, lunch and dinner (4 checks daily)   ALIVE WOMENS GUMMY Chew Chew by mouth.   glucose blood test strip Use as instructed   metFORMIN 500 MG tablet Commonly known as:  GLUCOPHAGE TAKE 1 TAB BY MOUTH DAILY FOR 1 WEEK THEN UP TO 1 TAB TWICE A DAY FOR 1 WEEK THEN 2 TABS TWICE A DAY   oxyCODONE-acetaminophen 5-325 MG tablet Commonly known as:  PERCOCET/ROXICET Take 1 tablet by mouth every 6 (  six) hours as needed for up to 5 days for moderate pain (pain score 4-7/10).            Discharge Care Instructions  (From admission, onward)         Start     Ordered   11/05/17 0000  Discharge wound care:    Comments:  Keep incision dry, clean.   11/05/17 0920          Diet: diabetic healthy diet  Activity: Advance as tolerated. Pelvic rest for 6 weeks.   Outpatient follow up: Follow-up Information    Jackson, Stephen D, MD. Go in 1 week(s).   Specialty:  Obstetrics and Gynecology Why:  Post op incision check Contact information: [address] [place] [address] [phone]             Postpartum contraception: IUD Paragard Rhogam Given postpartum: no Rubella vaccine given postpartum: no Varicella vaccine given postpartum: no TDaP given antepartum or postpartum: 09/15/17  Newborn Data: Live born female  Birth Weight: 6 lb 10.9 oz (3030 g) APGAR: 8, 9  Newborn Delivery   Birth date/time:  11/03/2017 10:50:00 Delivery  type:  C-Section, Low Transverse Trial of labor:  Yes C-section categorization:  Repeat      Baby Feeding: Breast and formula  Disposition:home with mother  SIGNED: Jane Gledhill, CNM   

## 2017-11-03 NOTE — Lactation Note (Signed)
This note was copied from a baby's chart. Lactation Consultation Note  Patient Name: Terri Ho TZGYF'V Date: 11/03/2017 Reason for consult: Early term 37-38.6wks;Other (Comment)(Mom wanting to give bottles - First blood sugar was 24) Went in room to assist mom with breast feeding at 3 hours from last feeding.  Mom was in too much pain and was nauseated.  Told mom I would come back in 30 minutes to an hour.  [name]'s blood glucose's have been low with readings of 24, 50, 36 and the last one pre feeding was 33.  When went back into room to assist mom with feeding again after allowing time for medication to take affect, she was giving a bottle of formula (12 ml).  Offered to still try and put baby to breast, but mom declined.  Offered to set up DEBP.  Mom declined stating she might try tomorrow.  Reviewed supply and demand and need to stimulate breast to bring in mature milk and ensure a plentiful milk supply.  Explained normal course of lactation and routine newborn feeding patterns.  Mom still declined assistance.  Lactation name and number written on white board and encouraged to call with any questions, concerns or assistance.      Maternal Data Formula Feeding for Exclusion: No Has patient been taught Hand Expression?: Yes Does the patient have breastfeeding experience prior to this delivery?: Yes  Feeding Feeding Type: Bottle Fed - Formula Nipple Type: Slow - flow Length of feed: 15 min  LATCH Score                   Interventions    Lactation Tools Discussed/Used WIC Program: No(BCBS)   Consult Status Consult Status: Follow-up Follow-up type: Call as needed    [name] 11/03/2017, 5:24 PM

## 2017-11-03 NOTE — Anesthesia Preprocedure Evaluation (Signed)
Anesthesia Evaluation  Patient identified by MRN, date of birth, ID band Patient awake    Reviewed: Allergy & Precautions, H&P , NPO status , Patient's Chart, lab work & pertinent test results  Airway Mallampati: II  TM Distance: >3 FB Neck ROM: full    Dental no notable dental hx.    Pulmonary sleep apnea ,    Pulmonary exam normal        Cardiovascular negative cardio ROS Normal cardiovascular exam     Neuro/Psych negative neurological ROS  negative psych ROS   GI/Hepatic negative GI ROS, Neg liver ROS,   Endo/Other  diabetes  Renal/GU negative Renal ROS  negative genitourinary   Musculoskeletal   Abdominal   Peds  Hematology negative hematology ROS (+)   Anesthesia Other Findings   Reproductive/Obstetrics (+) Pregnancy                             Anesthesia Physical Anesthesia Plan  ASA: II  Anesthesia Plan: Epidural   Post-op Pain Management:    Induction:   PONV Risk Score and Plan:   Airway Management Planned:   Additional Equipment:   Intra-op Plan:   Post-operative Plan:   Informed Consent: I have reviewed the patients History and Physical, chart, labs and discussed the procedure including the risks, benefits and alternatives for the proposed anesthesia with the patient or authorized representative who has indicated his/her understanding and acceptance.     Plan Discussed with: Anesthesiologist  Anesthesia Plan Comments:         Anesthesia Quick Evaluation

## 2017-11-03 NOTE — Anesthesia Procedure Notes (Signed)
Date/Time: 11/03/2017 10:15 AM Performed by: Rudean Hitt, CRNA Pre-anesthesia Checklist: Patient identified, Emergency Drugs available, Suction available, Patient being monitored and Timeout performed Patient Re-evaluated:Patient Re-evaluated prior to induction Oxygen Delivery Method: Nasal cannula

## 2017-11-03 NOTE — Anesthesia Post-op Follow-up Note (Signed)
Anesthesia QCDR form completed.        

## 2017-11-03 NOTE — Progress Notes (Signed)
VBAC protocol initiated. Dr. Georgianne Fick notified and is in house, [place] RN, Roselyn Reef notified.  Anesthesia Dr. Rosey Bath notified, and house charge RN Webb Silversmith.

## 2017-11-03 NOTE — Progress Notes (Signed)
Provider called for Urgent cesarean section for FITL at [phone]. Rosey Bath from Anesthesia notified at [phone]. Roselyn Reef in [place] notified at [phone] and informed birthplace had no surgical tech. AC notified at [phone]. Mother Baby notified at [phone] to obtain bed. Transition nurse notified at [phone].

## 2017-11-03 NOTE — H&P (Signed)
Obstetrics Admission History & Physical   PROM. Type II Diabetes, desires VBAC  HPI:  32 y.o. A1K5537 @ [redacted]w[redacted]d(11/20/2017, by Ultrasound). Admitted on 11/03/2017:   Patient Active Problem List   Diagnosis Date Noted  . PROM (premature rupture of membranes) 11/03/2017  . S/P abdominoplasty 05/12/2017  . History of bariatric surgery 05/05/2017  . Obesity affecting pregnancy 04/11/2017  . BMI 31.0-31.9,adult 04/11/2017  . History of cesarean delivery affecting pregnancy 04/11/2017  . Obstructive sleep apnea of adult 03/28/2017  . Type 2 diabetes mellitus ([place] 03/28/2017  . Vitamin B12 deficiency (non anemic) 03/28/2017  . Vitamin D deficiency 03/28/2017  . Supervision of high risk pregnancy, antepartum, third trimester 03/28/2017  . Pre-existing type 2 diabetes mellitus during pregnancy in third trimester 03/28/2017  . Obesity, unspecified 10/20/2013     Presents for rupture of membranes at home around 0300 this morning, reports clear and odorless fluid. Painful contractions began around 0345 about every 10 minutes, rating pain 7/10. She denies vaginal bleeding. She reports good fetal movement.  [facility] P[facility] complicated by class  A2 DM.  ROS: A review of systems was performed and negative, except as stated in the above HPI. PMHx:  Past Medical History:  Diagnosis Date  . Diabetes mellitus 04/06/2012   TYPE 2; NON INSULIN DEP   . Family history of breast cancer 06/2016   cancer genetic testing discussed, pt considering  . Supervision of high risk pregnancy, antepartum, first trimester 03/28/2017   [facility] Prenatal Labs Dating 8wk [place] type: O/Positive/-- (02/22 1504)  Genetic Screen  NIPS: normal XY Antibody:Negative (02/22 1504) Anatomic [place] Rubella: 3.62 (02/22 1504) Varicella: Immune GTT  Has diabetes RPR: Non Reactive (02/22 1504)  Rhogam  HBsAg: Negative (02/22 1504)  TDaP vaccine                        Flu Shot: HIV: Non Reactive (02/22  1504)  Baby Food                      . Vitamin D deficiency    PSHx:  Past Surgical History:  Procedure Laterality Date  . BARIATRIC SURGERY  01/2014  . CESAREAN SECTION  10/28/2012  . TUMMY TUCK  11/2014   Medications:  Medications Prior to Admission  Medication Sig Dispense Refill Last Dose  . metFORMIN (GLUCOPHAGE) 500 MG tablet TAKE 1 TAB BY MOUTH DAILY FOR 1 WEEK THEN UP TO 1 TAB TWICE A DAY FOR 1 WEEK THEN 2 TABS TWICE A DAY 60 tablet 1 11/02/2017 at Unknown time  . Multiple Vitamins-Minerals (ALIVE WOMENS GUMMY) CHEW Chew by mouth.   11/02/2017 at Unknown time  . Blood Glucose Monitoring Suppl (ACCU-CHEK NANO SMARTVIEW) w/Device KIT 1 kit by Subdermal route as directed. Check blood sugars for fasting, and two hours after breakfast, lunch and dinner (4 checks daily) 1 kit 0 Taking  . ferrous sulfate (FERROUSUL) 325 (65 FE) MG tablet Take 1 tablet (325 mg total) by mouth daily with breakfast. (Patient not taking: Reported on 10/24/2017) 30 tablet 4 Not Taking at Unknown time  . folic acid (FOLVITE) 8482MCG tablet Take 400 mcg by mouth daily.   Not Taking at Unknown time  . glucose blood test strip Use as instructed 100 each 12 Taking  . Prenatal-DSS-FeCb-FeGl-FA (CITRANATAL BLOOM) 90-1 MG TABS Take 1 tablet by mouth daily. (Patient not taking: Reported on 10/24/2017) 90 tablet 3 Not  Taking at Unknown time   Allergies: has No Known Allergies. OBHx:  OB History  Gravida Para Term Preterm AB Living  _0 0 3 1  SAB TAB Ectopic Multiple Live Births  _1 # Outcome Date GA Lbr Len/2nd Weight Sex Delivery Anes PTL Lv  5 Current           4 Term 10/28/12 [redacted]w[redacted]d 3447 g M CS-Unspec   LIV     Complications: Fetal Intolerance  3 SAB           2 TAB           1 TAB            FVQQ:VZDGLthan listed in HPI remarkable for: breast cancer and hypertension in her mother; diabetes, COPD and congestive heart failure in her father.  No family history of birth defects. Soc Hx: Never  smoker, Alcohol: none, Recreational drug use: none and Pregnancy welcomed  Objective:   Vitals:   11/03/17 0427 11/03/17 0428  BP:  123/79  Pulse: 90 89  Resp: 16   Temp: 98.5 F (36.9 C)    Constitutional: Well nourished, well developed female, uncomfortable with contractions HEENT: normal Skin: Warm and dry.  Cardiovascular: Regular rate and rhythm, S1 and S2 auscultated.   Extremity: trace to 1+ bilateral pedal edema Respiratory: Clear to auscultation bilaterally. Normal respiratory effort Abdomen: non-tender without uterine activity, moderate with contractions Back: no CVAT Neuro: Cranial nerves grossly intact Psych: Alert and Oriented x3. No memory deficits. Normal mood and affect.  MS: normal gait, normal bilateral lower extremites ROM/strength/stability.  Pelvic exam: is not limited by body habitus External Genitalia, Bartholin's glands, Urethra, Skene's glands: within normal limits Vagina: within normal limits and with small amount of blood in the vault Cervix: 2/50/-2; SSE showed small amount of blood tinged fluid, nitrazine equivocal, ferning positive Uterus: Spontaneous uterine activity  Adnexa: not evaluated  EFM: FHR: 150 bpm, variability: moderate,  accelerations:  Present,  decelerations:  Absent Toco: Frequency: Every 2-4 minutes, Duration: 50-80 seconds and Intensity: mild to moderate   Perinatal info:  Blood type: O positive Rubella - Immune Varicella - Immune TDaP Given during third trimester of this pregnancy 09/15/2017 RPR NR / HIV Neg/ HBsAg Neg   Assessment & Plan:   32y.o. [id number]@ 380w4dAdmitted on 11/03/2017: with PROM, Type II DM, desires VBAC.    Admit for labor, Observe for cervical change, Fetal Wellbeing Reassuring, Epidural when ready, AROM when Appropriate and GBS status negative, treat as needed. Check BG and treat as needed for hypo- or hyperglycemia.  JaAvel SensorCNM [facility], [place] 11/03/2017  6:04  AM

## 2017-11-03 NOTE — Progress Notes (Signed)
L&D Progress Note    S: Comfortable with epidural. CTSP due to variable/late decelerations  O: BP 112/62 (BP Location: Right Arm)   Pulse 88   Temp 98 F (36.7 C) (Oral)   Resp 17   Ht 5\' 3"  (1.6 m)   Wt 90.7 kg   LMP 02/16/2017 (Approximate)   BMI 35.43 kg/m    HAd some occasional variables and marked variability prior to receiving epidural for pain. FSE and IUPC inserted. Contractions every 1-3 minutes apart. Cervix has progressed to 4.5cm/80%/-1 Amnioinfusion begun for variables, but it has not relieved decelerations. FHR decelerations to 90 are repetitive and some variables, and now some late decelerations Position changes, IV bolus, O2 have not helped  A: FITL in the setting of prior Cesarean section  P: Terbutaline 0.25 mcg [place] Consulted Dr Glennon Mac and urgent section called Discussed FHR tracing and implications with mother and her husband and they agree with repeat Cesarean section.  Dalia Heading, CNM

## 2017-11-03 NOTE — Plan of Care (Signed)
Repeat c-section delivery for fetal intolerance to labor

## 2017-11-03 NOTE — Plan of Care (Signed)
SP repeat c-section.

## 2017-11-03 NOTE — Progress Notes (Signed)
Patient with fetal heart rate tracing with marked variability and repetitive variable decelerations, more recently late decelerations.   To the OR for fetal intolerance of labor in the setting of a history of cesarean delivery. Patient consented and agrees. Anesthesia/OR aware.  Surgeon who is being bumped aware and assents to have his case delayed.   Prentice Docker, MD, [facility], [facility] 11/03/2017 10:07 AM

## 2017-11-03 NOTE — Op Note (Addendum)
Cesarean Section Operative Note    Terri Ho   11/03/2017   Pre-operative Diagnosis:   1) intrauterine pregnancy at [redacted]w[redacted]d  2) active labor 3) history of cesarean delivery 4) history of abdominoplasty 5) fetal intolerance of labor  Post-operative Diagnosis:  1) intrauterine pregnancy at [redacted]w[redacted]d  2) active labor 3) history of cesarean delivery 4) history of abdominoplasty 5) fetal intolerance of labor   Procedure: Repeat low transverse cesarean section via Pfannenstiel incision with double-layer uterine closure  Surgeon: Surgeon(s) and Role:    * Will Bonnet, MD - Primary   Assistants: Dalia Heading, CNM. No other capable assistant available, in surgery requiring high level assistant.  Anesthesia: epidural   Findings:  1) normal appearing gravid uterus, fallopian tubes, and ovaries 2) Significant abdominal wall adhesions from prior abdominoplasty 3) viable female infant with APGARs 8 and 9, weight 3,030 grams   Estimated Blood Loss: 800 mL  Total IV Fluids: 2,000 mL crystalloid  Urine Output: 175 mL clear urine at end of procedure  Specimens: none  Complications: no complications  Disposition: PACU - hemodynamically stable.   Maternal Condition: stable   Baby condition / location:  Couplet care / Skin to Skin  Procedure Details:  The patient was seen in the Holding Room. The risks, benefits, complications, treatment options, and expected outcomes were discussed with the patient. The patient concurred with the proposed plan, giving informed consent. identified as [employer] and the procedure verified as C-Section Delivery. A Time Out was held and the above information confirmed.   After induction of anesthesia, the patient was draped and prepped in the usual sterile manner. A Pfannenstiel incision was made and carried down through the subcutaneous tissue to the fascia. Fascial incision was made and extended transversely. The fascia was separated from  the underlying rectus tissue superiorly and inferiorly. There was significant thickening of the fascial wall.  There was significant anterior abdominal wall thickening, including rectus abdominus muscles, thickened peritoneum.  The peritoneum was identified and entered. Peritoneal incision was extended longitudinally. The bladder flap was not bluntly or sharply freed from the lower uterine segment. A low transverse uterine incision was made and the hysterotomy was extended with cranial-caudal tension. Delivered from cephalic presentation was a 3,030 gram Living newborn infant(s) or Female with Apgar scores of 8 at one minute and 9 at five minutes. Cord ph was not sent the umbilical cord was clamped and cut cord blood was obtained for evaluation. The placenta was removed Intact and appeared normal. The uterine outline, tubes and ovaries appeared normal. The lower uterine segment appeared intact prior to hysterotomy.  The uterine incision was closed with running locked sutures of 0 Vicryl.  A second layer of the same suture was thrown in an imbricating fashion.  Hemostasis was assured.  The uterus was returned to the abdomen and the paracolic gutters were cleared of all clots and debris.  The rectus muscles were inspected and found to be hemostatic.  Given the difficult abdominal entry, the bladder was also carefully inspected and found to be free of defect.  The peritoneum was re-approximated using 0-vicryl in a running fashion.  The fascia was then reapproximated with running sutures of 1-0 PDS, looped. The subcutaneous tissue was reapproximated using 2-0 plain gut such that no greater than 2cm of dead space remained. The subcuticular closure was performed using 4-0 monocryl. The skin closure was reinforced using benzoin and 1/2" steri-strips.  Instrument, sponge, and needle counts were correct prior  the abdominal closure and were correct at the conclusion of the case.  The patient received Ancef 2 gram IV and  Azithromycin 500 mg IV prior to skin incision (within 30 minutes). Given her prolonged ruptured of membranes a vaginal prep was performed with betadine prior to the start of the surgery.  For VTE prophylaxis she was wearing SCDs throughout the case.  The assistant surgeon was a CNM due to lack of availability of another Counselling psychologist.  Signed: Will Bonnet, MD 11/03/2017 12:07 PM

## 2017-11-04 LAB — CBC
HEMATOCRIT: 29 % — AB (ref 35.0–47.0)
HEMOGLOBIN: 9.5 g/dL — AB (ref 12.0–16.0)
MCH: 26.9 pg (ref 26.0–34.0)
MCHC: 32.8 g/dL (ref 32.0–36.0)
MCV: 81.9 fL (ref 80.0–100.0)
Platelets: 242 10*3/uL (ref 150–440)
RBC: 3.54 MIL/uL — ABNORMAL LOW (ref 3.80–5.20)
RDW: 14.9 % — AB (ref 11.5–14.5)
WBC: 13.4 10*3/uL — ABNORMAL HIGH (ref 3.6–11.0)

## 2017-11-04 LAB — GLUCOSE, CAPILLARY
Glucose-Capillary: 77 mg/dL (ref 70–99)
Glucose-Capillary: 123 mg/dL — ABNORMAL HIGH (ref 70–99)
Glucose-Capillary: 99 mg/dL (ref 70–99)
Glucose-Capillary: 98 mg/dL (ref 70–99)

## 2017-11-04 LAB — RPR: RPR: NONREACTIVE

## 2017-11-04 NOTE — Anesthesia Post-op Follow-up Note (Signed)
  Anesthesia Pain Follow-up Note  Patient: Terri Ho  Day #: 1  Date of Follow-up: 11/04/2017 Time: 9:46 AM  Last Vitals:  Vitals:   11/04/17 0403 11/04/17 0849  BP: 110/71 116/79  Pulse: 88 99  Resp: 18 20  Temp: 36.9 C 36.7 C  SpO2: 99% 99%    Level of Consciousness: alert  Pain: none   Side Effects:Pruritis  Catheter Site Exam:clean, dry     Plan: D/C from anesthesia care at surgeon's request  [facility]

## 2017-11-04 NOTE — Plan of Care (Signed)
Patient's vital signs stable; fundus firm; small amount rubra lochia; IV fluids continued; IV site clear; patient with good appetite at dinner; good po fluids; TCDB with splinting; incentive spirometer used every hour while awake and 800 reached (goal 1250); denies pain; benadryl po controlled itching; foley catheter discontinued; SCD's in use; husband at bedside and attentive; breastfed once tonight with good technique observed; CBG 134 two hours post parandial last night.

## 2017-11-04 NOTE — Lactation Note (Signed)
This note was copied from a baby's chart. Lactation Consultation Note  Patient Name: Terri Ho Date: 11/04/2017     Maternal Data    Feeding Feeding Type: Bottle Fed - Formula Nipple Type: Slow - flow  LATCH Score                   Interventions    Lactation Tools Discussed/Used     Consult Status  Educated parents on baby's stomach size for formula feeding and showed them a visual in Breastfeeding booklet.     Marnee Spring 11/04/2017, 2:06 PM

## 2017-11-04 NOTE — Progress Notes (Signed)
POD#1 rLTCS, DM II Subjective:  Walking in room, voiding without difficulty. Pain control is adequate with medication. Not yet passing flatus. No nausea or dizziness.  Objective:  Blood pressure 116/79, pulse 99, temperature 98.1 F (36.7 C), temperature source Oral, resp. rate 20, height 5\' 3"  (1.6 m), weight 90.7 kg, last menstrual period 02/16/2017, SpO2 99 %.  General: NAD Cardiovascular: RRR, no murmurs, rubs, or gallops Pulmonary: no increased work of breathing, CTAB Abdomen: non-distended, non-tender, fundus firm , lochia appropriate. Bowel sounds present. Incision: Dressing C/D/I. On-Q infusing without difficulty. Extremities: no edema, no erythema, no tenderness  Results for orders placed or performed during the hospital encounter of 11/03/17 (from the past 72 hour(s))  CBC     Status: Abnormal   Collection Time: 11/03/17  6:14 AM  Result Value Ref Range   WBC 6.5 3.6 - 11.0 K/uL   RBC 3.70 (L) 3.80 - 5.20 MIL/uL   Hemoglobin 10.1 (L) 12.0 - 16.0 g/dL   HCT 30.2 (L) 35.0 - 47.0 %   MCV 81.5 80.0 - 100.0 fL   MCH 27.4 26.0 - 34.0 pg   MCHC 33.6 32.0 - 36.0 g/dL   RDW 14.8 (H) 11.5 - 14.5 %   Platelets 220 150 - 440 K/uL    Comment: Performed at [facility], [address] [place], [place] [address]  Type and screen [place]     Status: None   Collection Time: 11/03/17  6:14 AM  Result Value Ref Range   ABO/RH(D) O POS    Antibody Screen NEG    Sample Expiration      11/06/2017 Performed at [facility], [address] [place], [place] [address]   RPR     Status: None   Collection Time: 11/03/17  6:14 AM  Result Value Ref Range   RPR Ser Ql Non Reactive Non Reactive    Comment: (NOTE) Performed At: [facility] [place], [place] [id number] Rush Farmer MD MV:[id number]   Glucose, capillary     Status: None   Collection Time: 11/03/17  6:57 AM  Result Value Ref Range   Glucose-Capillary 82  70 - 99 mg/dL  Glucose, capillary     Status: None   Collection Time: 11/03/17  8:08 AM  Result Value Ref Range   Glucose-Capillary 99 70 - 99 mg/dL  Glucose, capillary     Status: Abnormal   Collection Time: 11/03/17  2:04 PM  Result Value Ref Range   Glucose-Capillary 123 (H) 70 - 99 mg/dL  Glucose, capillary     Status: Abnormal   Collection Time: 11/03/17  9:21 PM  Result Value Ref Range   Glucose-Capillary 134 (H) 70 - 99 mg/dL  CBC     Status: Abnormal   Collection Time: 11/04/17  5:05 AM  Result Value Ref Range   WBC 13.4 (H) 3.6 - 11.0 K/uL   RBC 3.54 (L) 3.80 - 5.20 MIL/uL   Hemoglobin 9.5 (L) 12.0 - 16.0 g/dL   HCT 29.0 (L) 35.0 - 47.0 %   MCV 81.9 80.0 - 100.0 fL   MCH 26.9 26.0 - 34.0 pg   MCHC 32.8 32.0 - 36.0 g/dL   RDW 14.9 (H) 11.5 - 14.5 %   Platelets 242 150 - 440 K/uL    Comment: Performed at [facility], [address] [place], [place] [address]  Glucose, capillary     Status: None   Collection Time: 11/04/17  7:13 AM  Result Value Ref Range   Glucose-Capillary 77 70 - 99 mg/dL     Assessment:   32 y.o. [id number] postoperative day #1 recovering well.  Plan:  1) Acute blood loss anemia - hemodynamically stable and asymptomatic - PO ferrous sulfate and vitamins  2) Blood Type --/--/O POS (09/30 8979) / Rubella 3.62 (02/22 1504) / Varicella Immune  3) TDAP status: received antepartum 09/15/17  4) Breast and formula.  5) Disposition: continue postpartum care. Continue to check BG QID.  Avel Sensor, CNM 11/04/2017  10:31 AM

## 2017-11-04 NOTE — Anesthesia Postprocedure Evaluation (Signed)
Anesthesia Post Note  Patient: Terri Ho  Procedure(s) Performed: CESAREAN SECTION- URGENT (N/A )  Patient location during evaluation: Nursing Unit Anesthesia Type: Epidural Level of consciousness: awake, awake and alert and oriented Pain management: pain level controlled Vital Signs Assessment: post-procedure vital signs reviewed and stable Respiratory status: spontaneous breathing, nonlabored ventilation and respiratory function stable Cardiovascular status: blood pressure returned to baseline and stable Postop Assessment: no headache and no backache Anesthetic complications: no     Last Vitals:  Vitals:   11/04/17 0403 11/04/17 0849  BP: 110/71 116/79  Pulse: 88 99  Resp: 18 20  Temp: 36.9 C 36.7 C  SpO2: 99% 99%    Last Pain:  Vitals:   11/04/17 0849  TempSrc: Oral  PainSc:                  Johnna Acosta

## 2017-11-05 LAB — GLUCOSE, CAPILLARY
Glucose-Capillary: 103 mg/dL — ABNORMAL HIGH (ref 70–99)
Glucose-Capillary: 68 mg/dL — ABNORMAL LOW (ref 70–99)

## 2017-11-05 MED ORDER — INFLUENZA VAC SPLIT QUAD 0.5 ML IM SUSY
0.5000 mL | PREFILLED_SYRINGE | Freq: Once | INTRAMUSCULAR | Status: AC
  Administered 2017-11-05: 0.5 mL via INTRAMUSCULAR
  Filled 2017-11-05: qty 0.5

## 2017-11-05 MED ORDER — OXYCODONE-ACETAMINOPHEN 5-325 MG PO TABS: 1.0000 | ORAL_TABLET | Freq: Four times a day (QID) | ORAL | 0 refills | Status: AC | PRN

## 2017-11-05 MED ORDER — INFLUENZA VAC SPLIT QUAD 0.5 ML IM SUSY: 0.5000 mL | PREFILLED_SYRINGE | INTRAMUSCULAR | Status: DC

## 2017-11-05 NOTE — Progress Notes (Signed)
Dc to home with NB.  To car via auxillary with family.

## 2017-11-11 ENCOUNTER — Other Ambulatory Visit: Payer: Self-pay | Admitting: Obstetrics and Gynecology

## 2017-11-11 ENCOUNTER — Telehealth: Payer: Self-pay

## 2017-11-11 MED ORDER — OXYCODONE-ACETAMINOPHEN 5-325 MG PO TABS: 1.0000 | ORAL_TABLET | ORAL | 0 refills | Status: DC | PRN

## 2017-11-11 NOTE — Telephone Encounter (Signed)
Terri Ho from Terri Ho on Terri [facility].  They gave a rx for percocet 5-325 #20 printed out from hosp on 10/2.  They are being told JEG's DEA # has expired.  Wants to know if someone else and rx or send electronically.  [phone]

## 2017-11-11 NOTE — Telephone Encounter (Signed)
Yes. I did her surgery anyway.Marland KitchenMarland Kitchen

## 2017-11-11 NOTE — Telephone Encounter (Signed)
Can you do this for pt for JEG?

## 2017-11-11 NOTE — Progress Notes (Signed)
Rx sent in for post-op pain medication, as the provider who originally prescribed has an expired DEA per the pharmacy.

## 2017-11-12 ENCOUNTER — Ambulatory Visit (INDEPENDENT_AMBULATORY_CARE_PROVIDER_SITE_OTHER): Payer: BLUE CROSS/BLUE SHIELD | Admitting: Obstetrics and Gynecology

## 2017-11-12 ENCOUNTER — Encounter: Payer: Self-pay | Admitting: Obstetrics and Gynecology

## 2017-11-12 ENCOUNTER — Telehealth: Payer: Self-pay | Admitting: Obstetrics and Gynecology

## 2017-11-12 VITALS — BP 118/74 | Ht 63.0 in | Wt 190.0 lb

## 2017-11-12 DIAGNOSIS — Z09 Encounter for follow-up examination after completed treatment for conditions other than malignant neoplasm: Secondary | ICD-10-CM

## 2017-11-12 DIAGNOSIS — Z9889 Other specified postprocedural states: Secondary | ICD-10-CM

## 2017-11-12 NOTE — Telephone Encounter (Signed)
11/12 AT 3:30 WITH SDJ FOR PARAGUARD INSERT

## 2017-11-12 NOTE — Progress Notes (Signed)
   Postoperative Follow-up Patient presents post op from cesarean section  10 days ago.  Subjective: She denies fever, chills, nausea and vomiting. Eating a regular diet without difficulty. The patient is not having any pain.  Activity: increasing slowly. She denies issues with her incision.    Objective: BP 118/74   Ht 5\' 3"  (1.6 m)   Wt 190 lb (86.2 kg)   LMP 02/16/2017 (Approximate)   BMI 33.66 kg/m   Constitutional: Well nourished, well developed female in no acute distress.  HEENT: normal Skin: Warm and dry.  Abdomen: S/NT/ND/+BS clean, dry, intact and without erythema, induration, warmth, and tenderness Extremity: no edema   Assessment: 32 y.o. s/p cesarean section progressing well  Plan: Patient has done well after surgery with no apparent complications.  I have discussed the post-operative course to date, and the expected progress moving forward.  The patient understands what complications to be concerned about.    Activity plan: increase slowly  Return in about 5 weeks (around 12/17/2017) for 6 wk postpartum and Paraguard insertion with Dr. Glennon Mac.  Prentice Docker, MD 11/12/2017 2:06 PM

## 2017-12-16 ENCOUNTER — Encounter: Payer: Self-pay | Admitting: Obstetrics and Gynecology

## 2017-12-16 ENCOUNTER — Ambulatory Visit (INDEPENDENT_AMBULATORY_CARE_PROVIDER_SITE_OTHER): Payer: BLUE CROSS/BLUE SHIELD | Admitting: Obstetrics and Gynecology

## 2017-12-16 DIAGNOSIS — Z30430 Encounter for insertion of intrauterine contraceptive device: Secondary | ICD-10-CM | POA: Diagnosis not present

## 2017-12-16 MED ORDER — PARAGARD INTRAUTERINE COPPER IU IUD: 1.0000 | INTRAUTERINE_SYSTEM | Freq: Once | INTRAUTERINE | 0 refills | Status: AC

## 2017-12-16 NOTE — Progress Notes (Signed)
Postpartum Visit  Chief Complaint:  Chief Complaint  Patient presents with  . 6 week post partum    History of Present Illness: Patient is a 32 y.o. [id number] presents for postpartum visit.  Date of delivery: 11/03/2017 Type of delivery: C-Section Episiotomy No.  Laceration: no Pregnancy or labor problems:  Type 2 DM Any problems since the delivery:  no  Newborn Details:  SINGLETON :  1. Baby's name: Eulas Post. Birth weight: 6.11 Maternal Details:  Breast Feeding:  Breast/Bottle Post partum depression/anxiety noted:  no Edinburgh Post-Partum Depression Score:  2  Date of last PAP: 06/05/2016/NORMAL  Past Medical History:  Diagnosis Date  . Diabetes mellitus 04/06/2012   TYPE 2; NON INSULIN DEP   . Family history of breast cancer 06/2016   cancer genetic testing discussed, pt considering  . Supervision of high risk pregnancy, antepartum, first trimester 03/28/2017   [facility]inic [facility] Prenatal Labs Dating 8wk [place] Blood type: O/Positive/-- (02/22 1504)  Genetic Screen  NIPS: normal XY Antibody:Negative (02/22 1504) Anatomic [place]  Rubella: 3.62 (02/22 1504) Varicella: Immune GTT  Has diabetes RPR: Non Reactive (02/22 1504)  Rhogam  HBsAg: Negative (02/22 1504)  TDaP vaccine                        Flu Shot: HIV: Non Reactive (02/22 1504)  Baby Food                      . Vitamin D deficiency     Past Surgical History:  Procedure Laterality Date  . BARIATRIC SURGERY  01/2014  . CESAREAN SECTION  10/28/2012  . CESAREAN SECTION N/A 11/03/2017   Procedure: CESAREAN SECTION- URGENT;  Surgeon: Will Bonnet, MD;  Location: [facility] ORS;  Service: Obstetrics;  Laterality: N/A;  . TUMMY TUCK  11/2014    Prior to Admission medications   Medication Sig Start Date End Date Taking? Authorizing Provider  Blood Glucose Monitoring Suppl (ACCU-CHEK NANO SMARTVIEW) w/Device KIT 1 kit by Subdermal route as directed. Check blood sugars for fasting, and two hours after breakfast, lunch and dinner (4  checks daily) 04/18/17   Schuman, Christanna R, MD  glucose blood test strip Use as instructed 04/18/17   Schuman, Christanna R, MD  metFORMIN (GLUCOPHAGE) 500 MG tablet TAKE 1 TAB BY MOUTH DAILY FOR 1 WEEK THEN UP TO 1 TAB TWICE A DAY FOR 1 WEEK THEN 2 TABS TWICE A DAY 09/24/17   Schuman, Stefanie Libel, MD  Multiple Vitamins-Minerals (ALIVE WOMENS GUMMY) CHEW Chew by mouth.    [provider]    No Known Allergies   Social History   Socioeconomic History  . Marital status: Married    Spouse name: Not on file  . Number of children: 1  . Years of education: 48  . Highest education level: Not on file  Occupational History  . Occupation: nurse    CommentTeacher, English as a foreign language  Social Needs  . Financial resource strain: Not hard at all  . Food insecurity:    Worry: Never true    Inability: Never true  . Transportation needs:    Medical: No    Non-medical: No  Tobacco Use  . Smoking status: Never Smoker  . Smokeless tobacco: Never Used  Substance and Sexual Activity  . Alcohol use: No    Alcohol/week: 0.0 standard drinks    Frequency: Never  . Drug use: No  . Sexual activity: Yes    Birth control/protection: None  Lifestyle  . Physical activity:    Days per week: 0 days    Minutes per session: 0 min  . Stress: Not at all  Relationships  . Social connections:    Talks on phone: Three times a week    Gets together: Three times a week    Attends religious service: Not on file    Active member of club or organization: Not on file    Attends meetings of clubs or organizations: Not on file    Relationship status: Not on file  . Intimate partner violence:    Fear of current or ex partner: Not on file    Emotionally abused: Not on file    Physically abused: Not on file    Forced sexual activity: Not on file  Other Topics Concern  . Not on file  Social History Narrative  . Not on file    Family History  Problem Relation Age of Onset  . Breast cancer Mother 70       again at  26  . Hypertension Mother   . Congestive Heart Failure Father   . COPD Father   . Diabetes Father        TYPE 2    Review of Systems  Constitutional: Negative.   HENT: Negative.   Eyes: Negative.   Respiratory: Negative.   Cardiovascular: Negative.   Gastrointestinal: Negative.   Genitourinary: Negative.   Musculoskeletal: Negative.   Skin: Negative.   Neurological: Negative.   Psychiatric/Behavioral: Negative.      Physical Exam BP 122/74   Ht _0  (1.6 m)   Wt 183 lb (83 kg)   LMP 02/16/2017 (Approximate)   BMI 32.42 kg/m   Physical Exam  Constitutional: She is oriented to person, place, and time. She appears well-developed and well-nourished. No distress.  Genitourinary: Vagina normal and uterus normal. Pelvic exam was performed with patient supine. There is no rash, tenderness, lesion or injury on the right labia. There is no rash, tenderness, lesion or injury on the left labia. Right adnexum does not display mass, does not display tenderness and does not display fullness. Left adnexum does not display mass, does not display tenderness and does not display fullness. Cervix does not exhibit motion tenderness, lesion or polyp.   Uterus is mobile and midaxial and anteverted. Uterus is not enlarged, tender, exhibiting a mass or irregular (is regular).  Genitourinary Comments: Bimanual partially limited by body habitus  HENT:  Head: Normocephalic and atraumatic.  Eyes: EOM are normal. No scleral icterus.  Neck: Normal range of motion. Neck supple.  Cardiovascular: Normal rate and regular rhythm.  Pulmonary/Chest: Effort normal and breath sounds normal. No respiratory distress. She has no wheezes. She has no rales.  Abdominal: Soft. Bowel sounds are normal. She exhibits no distension and no mass. There is no tenderness. There is no rebound and no guarding.  Incision: without erythema, induration, warmth, and tenderness. It is clean, dry, and intact.    Musculoskeletal:  Normal range of motion. She exhibits no edema.  Neurological: She is alert and oriented to person, place, and time. No cranial nerve deficit.  Skin: Skin is warm and dry. No erythema.  Psychiatric: She has a normal mood and affect. Her behavior is normal. Judgment normal.    IUD Insertion Procedure Note (Paragard) Patient identified, informed consent performed, consent signed.   Discussed risks of irregular bleeding, cramping, infection, malpositioning, expulsion or uterine perforation of the IUD (1:1000 placements)  which may require further  procedure such as laparoscopy.  IUD while effective at preventing pregnancy do not prevent transmission of sexually transmitted diseases and use of barrier methods for this purpose was discussed. Time out was performed.  Urine pregnancy test negative.  Speculum placed in the vagina.  Cervix visualized.  Cleaned with Betadine x 2.  Grasped anteriorly with a single tooth tenaculum.  Uterus sounded to 8 cm. IUD placed per manufacturer's recommendations.  Strings trimmed to 3 cm. Tenaculum was removed, good hemostasis noted.  Patient tolerated procedure well.   Patient was given post-procedure instructions.  She was advised to have backup contraception for one week.  Patient was also asked to check IUD strings periodically and follow up in 4 weeks for IUD check.  Female Chaperone present during breast and/or pelvic exam.  Urine Pregnancy Test: negative  Assessment: 32 y.o. [id number] presenting for 6 week postpartum visit  Plan: Problem List Items Addressed This Visit    None    Visit Diagnoses    Postpartum care and examination    -  Primary   Relevant Medications   PARAGARD INTRAUTERINE COPPER IUD IUD   Encounter for insertion of intrauterine contraceptive device (IUD)       Relevant Medications   PARAGARD INTRAUTERINE COPPER IUD IUD     1) Contraception Education given regarding options for contraception, including IUD placement.  2)  Pap - ASCCP  guidelines and rational discussed.  Patient opts for routine screening interval. She is up to date.  3) Patient underwent screening for postpartum depression with no concerns noted.  4) Follow up 4 weeks for IUD string check  Prentice Docker, MD 12/16/2017 4:28 PM

## 2017-12-16 NOTE — Telephone Encounter (Signed)
Paragard provided for this patient.

## 2017-12-20 ENCOUNTER — Other Ambulatory Visit: Payer: Self-pay | Admitting: Obstetrics and Gynecology

## 2017-12-20 DIAGNOSIS — E119 Type 2 diabetes mellitus without complications: Secondary | ICD-10-CM

## 2018-01-13 ENCOUNTER — Ambulatory Visit: Payer: BLUE CROSS/BLUE SHIELD | Admitting: Obstetrics and Gynecology

## 2018-02-04 HISTORY — PX: BREAST REDUCTION SURGERY: SHX8

## 2018-05-06 ENCOUNTER — Other Ambulatory Visit: Payer: Self-pay | Admitting: Obstetrics and Gynecology

## 2018-05-06 DIAGNOSIS — E119 Type 2 diabetes mellitus without complications: Secondary | ICD-10-CM

## 2018-05-23 ENCOUNTER — Other Ambulatory Visit: Payer: Self-pay | Admitting: Obstetrics and Gynecology

## 2018-05-23 DIAGNOSIS — E119 Type 2 diabetes mellitus without complications: Secondary | ICD-10-CM

## 2018-05-25 ENCOUNTER — Other Ambulatory Visit: Payer: Self-pay | Admitting: Obstetrics and Gynecology

## 2018-05-25 DIAGNOSIS — E119 Type 2 diabetes mellitus without complications: Secondary | ICD-10-CM

## 2018-09-16 ENCOUNTER — Other Ambulatory Visit: Payer: Self-pay

## 2018-09-17 ENCOUNTER — Other Ambulatory Visit: Payer: Self-pay

## 2018-09-17 ENCOUNTER — Encounter: Payer: Self-pay | Admitting: Internal Medicine

## 2018-09-17 ENCOUNTER — Ambulatory Visit (INDEPENDENT_AMBULATORY_CARE_PROVIDER_SITE_OTHER): Payer: BC Managed Care – PPO | Admitting: Internal Medicine

## 2018-09-17 VITALS — BP 118/80 | HR 70 | Temp 98.2°F | Ht 64.0 in | Wt 186.0 lb

## 2018-09-17 DIAGNOSIS — E119 Type 2 diabetes mellitus without complications: Secondary | ICD-10-CM | POA: Diagnosis not present

## 2018-09-17 DIAGNOSIS — Z1159 Encounter for screening for other viral diseases: Secondary | ICD-10-CM

## 2018-09-17 DIAGNOSIS — Z9889 Other specified postprocedural states: Secondary | ICD-10-CM

## 2018-09-17 DIAGNOSIS — Z1329 Encounter for screening for other suspected endocrine disorder: Secondary | ICD-10-CM | POA: Diagnosis not present

## 2018-09-17 DIAGNOSIS — Z0184 Encounter for antibody response examination: Secondary | ICD-10-CM

## 2018-09-17 DIAGNOSIS — E669 Obesity, unspecified: Secondary | ICD-10-CM

## 2018-09-17 DIAGNOSIS — N939 Abnormal uterine and vaginal bleeding, unspecified: Secondary | ICD-10-CM

## 2018-09-17 DIAGNOSIS — E611 Iron deficiency: Secondary | ICD-10-CM | POA: Insufficient documentation

## 2018-09-17 DIAGNOSIS — E538 Deficiency of other specified B group vitamins: Secondary | ICD-10-CM

## 2018-09-17 DIAGNOSIS — E559 Vitamin D deficiency, unspecified: Secondary | ICD-10-CM

## 2018-09-17 HISTORY — DX: Abnormal uterine and vaginal bleeding, unspecified: N93.9

## 2018-09-17 MED ORDER — METFORMIN HCL 500 MG PO TABS: ORAL_TABLET | ORAL | 3 refills | Status: DC

## 2018-09-17 NOTE — Patient Instructions (Signed)
Exercising to Lose Weight Exercise is structured, repetitive physical activity to improve fitness and health. Getting regular exercise is important for everyone. It is especially important if you are overweight. Being overweight increases your risk of heart disease, stroke, diabetes, high blood pressure, and several types of cancer. Reducing your calorie intake and exercising can help you lose weight. Exercise is usually categorized as moderate or vigorous intensity. To lose weight, most people need to do a certain amount of moderate-intensity or vigorous-intensity exercise each week. Moderate-intensity exercise  Moderate-intensity exercise is any activity that gets you moving enough to burn at least three times more energy (calories) than if you were sitting. Examples of moderate exercise include:  Walking a mile in 15 minutes.  Doing light yard work.  Biking at an easy pace. Most people should get at least 150 minutes (2 hours and 30 minutes) a week of moderate-intensity exercise to maintain their body weight. Vigorous-intensity exercise Vigorous-intensity exercise is any activity that gets you moving enough to burn at least six times more calories than if you were sitting. When you exercise at this intensity, you should be working hard enough that you are not able to carry on a conversation. Examples of vigorous exercise include:  Running.  Playing a team sport, such as football, basketball, and soccer.  Jumping rope. Most people should get at least 75 minutes (1 hour and 15 minutes) a week of vigorous-intensity exercise to maintain their body weight. How can exercise affect me? When you exercise enough to burn more calories than you eat, you lose weight. Exercise also reduces body fat and builds muscle. The more muscle you have, the more calories you burn. Exercise also:  Improves mood.  Reduces stress and tension.  Improves your overall fitness, flexibility, and endurance.   Increases bone strength. The amount of exercise you need to lose weight depends on:  Your age.  The type of exercise.  Any health conditions you have.  Your overall physical ability. Talk to your health care provider about how much exercise you need and what types of activities are safe for you. What actions can I take to lose weight? Nutrition   Make changes to your diet as told by your health care provider or diet and nutrition specialist (dietitian). This may include: ? Eating fewer calories. ? Eating more protein. ? Eating less unhealthy fats. ? Eating a diet that includes fresh fruits and vegetables, whole grains, low-fat dairy products, and lean protein. ? Avoiding foods with added fat, salt, and sugar.  Drink plenty of water while you exercise to prevent dehydration or heat stroke. Activity  Choose an activity that you enjoy and set realistic goals. Your health care provider can help you make an exercise plan that works for you.  Exercise at a moderate or vigorous intensity most days of the week. ? The intensity of exercise may vary from person to person. You can tell how intense a workout is for you by paying attention to your breathing and heartbeat. Most people will notice their breathing and heartbeat get faster with more intense exercise.  Do resistance training twice each week, such as: ? Push-ups. ? Sit-ups. ? Lifting weights. ? Using resistance bands.  Getting short amounts of exercise can be just as helpful as long structured periods of exercise. If you have trouble finding time to exercise, try to include exercise in your daily routine. ? Get up, stretch, and walk around every 30 minutes throughout the day. ? Go for a   walk during your lunch break. ? Park your car farther away from your destination. ? If you take public transportation, get off one stop early and walk the rest of the way. ? Make phone calls while standing up and walking around. ? Take the  stairs instead of elevators or escalators.  Wear comfortable clothes and shoes with good support.  Do not exercise so much that you hurt yourself, feel dizzy, or get very short of breath. Where to find more information  U.S. Department of Health and Human Services: [employer].at  Centers for Disease Control and Prevention (CDC): http://www.wolf.info/ Contact a health care provider:  Before starting a new exercise program.  If you have questions or concerns about your weight.  If you have a medical problem that keeps you from exercising. Get help right away if you have any of the following while exercising:  Injury.  Dizziness.  Difficulty breathing or shortness of breath that does not go away when you stop exercising.  Chest pain.  Rapid heartbeat. Summary  Being overweight increases your risk of heart disease, stroke, diabetes, high blood pressure, and several types of cancer.  Losing weight happens when you burn more calories than you eat.  Reducing the amount of calories you eat in addition to getting regular moderate or vigorous exercise each week helps you lose weight. This information is not intended to replace advice given to you by your health care provider. Make sure you discuss any questions you have with your health care provider. Document Released: 02/23/2010 Document Revised: 02/03/2017 Document Reviewed: 02/03/2017 Elsevier Patient Education  2020 [employer].  Diabetes Mellitus and Nutrition, Adult When you have diabetes (diabetes mellitus), it is very important to have healthy eating habits because your blood sugar (glucose) levels are greatly affected by what you eat and drink. Eating healthy foods in the appropriate amounts, at about the same times every day, can help you:  Control your blood glucose.  Lower your risk of heart disease.  Improve your blood pressure.  Reach or maintain a healthy weight. Every person with diabetes is different, and each person  has different needs for a meal plan. Your health care provider may recommend that you work with a diet and nutrition specialist (dietitian) to make a meal plan that is best for you. Your meal plan may vary depending on factors such as:  The calories you need.  The medicines you take.  Your weight.  Your blood glucose, blood pressure, and cholesterol levels.  Your activity level.  Other health conditions you have, such as heart or kidney disease. How do carbohydrates affect me? Carbohydrates, also called carbs, affect your blood glucose level more than any other type of food. Eating carbs naturally raises the amount of glucose in your blood. Carb counting is a method for keeping track of how many carbs you eat. Counting carbs is important to keep your blood glucose at a healthy level, especially if you use insulin or take certain oral diabetes medicines. It is important to know how many carbs you can safely have in each meal. This is different for every person. Your dietitian can help you calculate how many carbs you should have at each meal and for each snack. Foods that contain carbs include:  Bread, cereal, rice, pasta, and crackers.  Potatoes and corn.  Peas, beans, and lentils.  Milk and yogurt.  Fruit and juice.  Desserts, such as cakes, cookies, ice cream, and candy. How does alcohol affect me? Alcohol can cause  a sudden decrease in blood glucose (hypoglycemia), especially if you use insulin or take certain oral diabetes medicines. Hypoglycemia can be a life-threatening condition. Symptoms of hypoglycemia (sleepiness, dizziness, and confusion) are similar to symptoms of having too much alcohol. If your health care provider says that alcohol is safe for you, follow these guidelines:  Limit alcohol intake to no more than 1 drink per day for nonpregnant women and 2 drinks per day for men. One drink equals 12 oz of beer, 5 oz of wine, or 1 oz of hard liquor.  Do not drink on an  empty stomach.  Keep yourself hydrated with water, diet soda, or unsweetened iced tea.  Keep in mind that regular soda, juice, and other mixers may contain a lot of sugar and must be counted as carbs. What are tips for following this plan?  Reading food labels  Start by checking the serving size on the "Nutrition Facts" label of packaged foods and drinks. The amount of calories, carbs, fats, and other nutrients listed on the label is based on one serving of the item. Many items contain more than one serving per package.  Check the total grams (g) of carbs in one serving. You can calculate the number of servings of carbs in one serving by dividing the total carbs by 15. For example, if a food has 30 g of total carbs, it would be equal to 2 servings of carbs.  Check the number of grams (g) of saturated and trans fats in one serving. Choose foods that have low or no amount of these fats.  Check the number of milligrams (mg) of salt (sodium) in one serving. Most people should limit total sodium intake to less than 2,300 mg per day.  Always check the nutrition information of foods labeled as "low-fat" or "nonfat". These foods may be higher in added sugar or refined carbs and should be avoided.  Talk to your dietitian to identify your daily goals for nutrients listed on the label. Shopping  Avoid buying canned, premade, or processed foods. These foods tend to be high in fat, sodium, and added sugar.  Shop around the outside edge of the grocery store. This includes fresh fruits and vegetables, bulk grains, fresh meats, and fresh dairy. Cooking  Use low-heat cooking methods, such as baking, instead of high-heat cooking methods like deep frying.  Cook using healthy oils, such as olive, canola, or sunflower oil.  Avoid cooking with butter, cream, or high-fat meats. Meal planning  Eat meals and snacks regularly, preferably at the same times every day. Avoid going long periods of time without  eating.  Eat foods high in fiber, such as fresh fruits, vegetables, beans, and whole grains. Talk to your dietitian about how many servings of carbs you can eat at each meal.  Eat 4-6 ounces (oz) of lean protein each day, such as lean meat, chicken, fish, eggs, or tofu. One oz of lean protein is equal to: ? 1 oz of meat, chicken, or fish. ? 1 egg. ?  cup of tofu.  Eat some foods each day that contain healthy fats, such as avocado, nuts, seeds, and fish. Lifestyle  Check your blood glucose regularly.  Exercise regularly as told by your health care provider. This may include: ? 150 minutes of moderate-intensity or vigorous-intensity exercise each week. This could be brisk walking, biking, or water aerobics. ? Stretching and doing strength exercises, such as yoga or weightlifting, at least 2 times a week.  Take medicines as  told by your health care provider.  Do not use any products that contain nicotine or tobacco, such as cigarettes and e-cigarettes. If you need help quitting, ask your health care provider.  Work with a Social worker or diabetes educator to identify strategies to manage stress and any emotional and social challenges. Questions to ask a health care provider  Do I need to meet with a diabetes educator?  Do I need to meet with a dietitian?  What number can I call if I have questions?  When are the best times to check my blood glucose? Where to find more information:  American Diabetes Association: diabetes.org  Academy of Nutrition and Dietetics: www.eatright.[employer] of Diabetes and Digestive and Kidney Diseases (NIH): [employer] Summary  A healthy meal plan will help you control your blood glucose and maintain a healthy lifestyle.  Working with a diet and nutrition specialist (dietitian) can help you make a meal plan that is best for you.  Keep in mind that carbohydrates (carbs) and alcohol have immediate effects on your blood glucose  levels. It is important to count carbs and to use alcohol carefully. This information is not intended to replace advice given to you by your health care provider. Make sure you discuss any questions you have with your health care provider. Document Released: 10/18/2004 Document Revised: 01/03/2017 Document Reviewed: 02/26/2016 Elsevier Patient Education  2020 [employer].

## 2018-09-17 NOTE — Progress Notes (Signed)
Chief Complaint  Patient presents with  . Establish Care   New patient  type 2 diabetes mellitus without complication, without long-term current use of insulin (HCC) s/p gastric sleeve Dr. Jon Bruce in 2015 taking metformin 500 mg 1x per day she has been dm 2 x 17 years   Iron deficiency - in pregnancy has not had iron checked in a while   Vitamin D deficiency  B12 deficiency   Vaginal spotting with paraguard and this is her 2nd paraguard today I rec she discuss with ob/gyn spotting is with sex    FH breast cancer in her mother she has not had genetic testing but her sister may have discussed consider in future with ob/gyn   Obesity on adipex 37.5 with Dr. Jon Bruce she has been on this x 4 months and wants refill but discussed this is not monthly medication and she needs to take a break of 3 months until 12/2018 she declines to see nutritionist for now   Review of Systems  Constitutional: Negative for weight loss.  HENT: Negative for hearing loss.   Eyes: Negative for blurred vision.  Respiratory: Negative for shortness of breath.   Cardiovascular: Negative for chest pain.  Gastrointestinal: Negative for abdominal pain.  Genitourinary:       +vaginal spotting with sex   Musculoskeletal: Negative for back pain.  Skin: Negative for rash.  Neurological: Negative for headaches.  Psychiatric/Behavioral: Negative for depression.   Past Medical History:  Diagnosis Date  . Diabetes mellitus 04/06/2012   TYPE 2; NON INSULIN DEP x 17 years as of 09/17/18   . Family history of breast cancer 06/2016   cancer genetic testing discussed, pt considering  . Iron deficiency   . Supervision of high risk pregnancy, antepartum, first trimester 03/28/2017   [facility] Prenatal Labs Dating 8wk US Blood type: O/Positive/-- (02/22 1504)  Genetic Screen  NIPS: normal XY Antibody:Negative (02/22 1504) Anatomic US  Rubella: 3.62 (02/22 1504) Varicella: Immune GTT  Has diabetes RPR: Non Reactive  (02/22 1504)  Rhogam  HBsAg: Negative (02/22 1504)  TDaP vaccine                        Flu Shot: HIV: Non Reactive (02/22 1504)  Baby Food                      . Vitamin D deficiency    Past Surgical History:  Procedure Laterality Date  . BARIATRIC SURGERY  01/2014   gastric sleeve Dr. Jon Bruce   . BUTTOCK LIFT     11/2015 Dr. Baez in [place]  . CESAREAN SECTION  10/28/2012  . CESAREAN SECTION N/A 11/03/2017   Procedure: CESAREAN SECTION- URGENT;  Surgeon: Jackson, Stephen D, MD;  Location: [facility] ORS;  Service: Obstetrics;  Laterality: N/A;  . TUMMY TUCK  11/2015   Dr. Baez    Family History  Problem Relation Age of Onset  . Breast cancer Mother 27       again at 39  . Hypertension Mother   . Cancer Mother        breast age 26/27 and in her 30s  . Congestive Heart Failure Father   . COPD Father   . Diabetes Father        TYPE 2  . Heart disease Father        chf   Social History   Socioeconomic History  . Marital status: Married      Spouse name: Not on file  . Number of children: 1  . Years of education: 16  . Highest education level: Not on file  Occupational History  . Occupation: nurse    Comment: [employer]  Social Needs  . Financial resource strain: Not hard at all  . Food insecurity    Worry: Never true    Inability: Never true  . Transportation needs    Medical: No    Non-medical: No  Tobacco Use  . Smoking status: Never Smoker  . Smokeless tobacco: Never Used  Substance and Sexual Activity  . Alcohol use: No    Alcohol/week: 0.0 standard drinks    Frequency: Never  . Drug use: No  . Sexual activity: Yes    Birth control/protection: None  Lifestyle  . Physical activity    Days per week: 0 days    Minutes per session: 0 min  . Stress: Not at all  Relationships  . Social connections    Talks on phone: Three times a week    Gets together: Three times a week    Attends religious service: Not on file    Active member of club or organization: Not on file     Attends meetings of clubs or organizations: Not on file    Relationship status: Not on file  . Intimate partner violence    Fear of current or ex partner: Not on file    Emotionally abused: Not on file    Physically abused: Not on file    Forced sexual activity: Not on file  Other Topics Concern  . Not on file  Social History Narrative   2 kids 10 month boy and 5 y.o boy as of 09/17/2018    Married husband DPR Devon Fleming [phone]   Works part time [employer]    Current Meds  Medication Sig  . ACCU-CHEK GUIDE test strip USE AS INSTRUCTED 4 TIMES DAILY  . Blood Glucose Monitoring Suppl (ACCU-CHEK GUIDE) w/Device KIT CHECK BLOOD SUGAR FOR FASTING & 2 HOURS AFTER BREAKFAST, LUNCH ,& DINNER. (4 TIMES DAILY)  . metFORMIN (GLUCOPHAGE) 500 MG tablet Qd with breakfast  . Multiple Vitamins-Minerals (ALIVE WOMENS GUMMY) CHEW Chew by mouth.  . phentermine (ADIPEX-P) 37.5 MG tablet TAKE 1 TABLET (37.5 MG TOTAL) BY MOUTH EVERY MORNING BEFORE BREAKFAST FOR 60 DAYS.  . [DISCONTINUED] metFORMIN (GLUCOPHAGE) 500 MG tablet TAKE 1 TAB BY MOUTH DAILY FOR 1 WEEK THEN UP TO 1 TAB TWICE A DAY FOR 1 WEEK THEN 2 TABS TWICE A DAY  . [DISCONTINUED] oxyCODONE-acetaminophen (PERCOCET/ROXICET) 5-325 MG tablet Take 1 tablet by mouth every 4 (four) hours as needed (breakthrough pain).   No Known Allergies No results found for this or any previous visit (from the past 2160 hour(s)). Objective  Body mass index is 31.93 kg/m. Wt Readings from Last 3 Encounters:  09/17/18 186 lb (84.4 kg)  12/16/17 183 lb (83 kg)  11/12/17 190 lb (86.2 kg)   Temp Readings from Last 3 Encounters:  09/17/18 98.2 F (36.8 C) (Temporal)  11/05/17 98.4 F (36.9 C) (Oral)  07/21/17 98.2 F (36.8 C) (Oral)   BP Readings from Last 3 Encounters:  09/17/18 118/80  12/16/17 122/74  11/12/17 118/74   Pulse Readings from Last 3 Encounters:  09/17/18 70  11/05/17 94  07/21/17 95    Physical  Exam Vitals signs and nursing note reviewed.  Constitutional:      Appearance: Normal appearance. She is well-developed   and well-groomed. She is obese.  HENT:     Head: Normocephalic and atraumatic.     Comments: Mask on   Eyes:     Conjunctiva/sclera: Conjunctivae normal.     Pupils: Pupils are equal, round, and reactive to light.  Cardiovascular:     Rate and Rhythm: Normal rate and regular rhythm.     Heart sounds: Normal heart sounds.  Pulmonary:     Effort: Pulmonary effort is normal.     Breath sounds: Normal breath sounds.  Skin:    General: Skin is warm and dry.     Comments: Tattoo to lower back    Neurological:     General: No focal deficit present.     Mental Status: She is alert and oriented to person, place, and time. Mental status is at baseline.     Gait: Gait normal.  Psychiatric:        Attention and Perception: Attention and perception normal.        Mood and Affect: Mood and affect normal.        Speech: Speech normal.        Behavior: Behavior normal. Behavior is cooperative.        Thought Content: Thought content normal.        Cognition and Memory: Cognition and memory normal.        Judgment: Judgment normal.     Assessment  /plan type 2 diabetes mellitus without complication, without long-term current use of insulin (HCC) - Plan: Comprehensive metabolic panel, CBC with Differential/Platelet, Lipid panel, Urinalysis, Routine w reflex microscopic, Hemoglobin A1c, Microalbumin / creatinine urine ratio [facility] -refilled metformin 500 mg qd  -eye exam [facility] in [place] -pt ok to obtain records  -do foot exam in future  rec healthy diet and exercise declines nutrition referral for now  Iron deficiency - Plan: Iron, TIBC and Ferritin Panel, rec mvt with iron s/p gastric sleeve   Vitamin D deficiency - Plan: Vitamin D (25 hydroxy)  B12 deficiency - Plan: B12  Vaginal spotting with paraguard rec discuss with ob/[facility] this is 2nd  paraguard   Obesity-rec pt hold adipex x 3 months she has already been on x 4 months with Dr. Kreg Shropshire not to resume until 12/2018 reviewed side effects   HM Flu shot utd tdap utd  Consider pna 23 vaccine in future   FH breast cancer in her mother in her 60s and 64s she has not had genetic testing, consider in future with ob/gyn  -offered mammogram today pt declines  [facility] has paraguard c/w bleeding with sex  -advised pt to disc with ob/gyn  Pap 06/05/2016 neg HPV neg pap h/o abnormal pap in the past  Declines std testing   Fasting labs [facility]    Provider: Dr. Olivia Mackie McLean-Scocuzza-Internal Medicine

## 2018-09-19 LAB — LIPID PANEL
Chol/HDL Ratio: 2.9 ratio (ref 0.0–4.4)
Cholesterol, Total: 145 mg/dL (ref 100–199)
HDL: 50 mg/dL (ref >39)
LDL Calculated: 85 mg/dL (ref 0–99)
Triglycerides: 52 mg/dL (ref 0–149)
VLDL Cholesterol Cal: 10 mg/dL (ref 5–40)

## 2018-09-19 LAB — CBC WITH DIFFERENTIAL/PLATELET
Basophils Absolute: 0 10*3/uL (ref 0.0–0.2)
Basos: 0 %
EOS (ABSOLUTE): 0.3 10*3/uL (ref 0.0–0.4)
Eos: 6 %
Hematocrit: 36.5 % (ref 34.0–46.6)
Hemoglobin: 12.2 g/dL (ref 11.1–15.9)
Immature Grans (Abs): 0 10*3/uL (ref 0.0–0.1)
Immature Granulocytes: 0 %
Lymphocytes Absolute: 1.8 10*3/uL (ref 0.7–3.1)
Lymphs: 43 %
MCH: 28 pg (ref 26.6–33.0)
MCHC: 33.4 g/dL (ref 31.5–35.7)
MCV: 84 fL (ref 79–97)
Monocytes Absolute: 0.3 10*3/uL (ref 0.1–0.9)
Monocytes: 7 %
Neutrophils Absolute: 1.9 10*3/uL (ref 1.4–7.0)
Neutrophils: 44 %
Platelets: 322 10*3/uL (ref 150–450)
RBC: 4.36 x10E6/uL (ref 3.77–5.28)
RDW: 13.9 % (ref 11.7–15.4)
WBC: 4.3 10*3/uL (ref 3.4–10.8)

## 2018-09-19 LAB — URINALYSIS, ROUTINE W REFLEX MICROSCOPIC
Bilirubin, UA: NEGATIVE
Glucose, UA: NEGATIVE
Ketones, UA: NEGATIVE
Leukocytes,UA: NEGATIVE
Nitrite, UA: NEGATIVE
Protein,UA: NEGATIVE
Specific Gravity, UA: 1.022 (ref 1.005–1.030)
Urobilinogen, Ur: 0.2 mg/dL (ref 0.2–1.0)
pH, UA: 5.5 (ref 5.0–7.5)

## 2018-09-19 LAB — COMPREHENSIVE METABOLIC PANEL
ALT: 17 IU/L (ref 0–32)
AST: 15 IU/L (ref 0–40)
Albumin/Globulin Ratio: 1.7 (ref 1.2–2.2)
Albumin: 4.5 g/dL (ref 3.8–4.8)
Alkaline Phosphatase: 63 IU/L (ref 39–117)
BUN/Creatinine Ratio: 18 (ref 9–23)
BUN: 13 mg/dL (ref 6–20)
Bilirubin Total: 0.4 mg/dL (ref 0.0–1.2)
CO2: 21 mmol/L (ref 20–29)
Calcium: 9.1 mg/dL (ref 8.7–10.2)
Chloride: 100 mmol/L (ref 96–106)
Creatinine, Ser: 0.71 mg/dL (ref 0.57–1.00)
GFR calc Af Amer: 129 mL/min/{1.73_m2} (ref >59)
GFR calc non Af Amer: 111 mL/min/{1.73_m2} (ref >59)
Globulin, Total: 2.7 g/dL (ref 1.5–4.5)
Glucose: 94 mg/dL (ref 65–99)
Potassium: 4.1 mmol/L (ref 3.5–5.2)
Sodium: 137 mmol/L (ref 134–144)
Total Protein: 7.2 g/dL (ref 6.0–8.5)

## 2018-09-19 LAB — MICROSCOPIC EXAMINATION
Bacteria, UA: NONE SEEN
Casts: NONE SEEN /lpf
RBC: NONE SEEN /hpf (ref 0–2)

## 2018-09-19 LAB — MICROALBUMIN / CREATININE URINE RATIO
Creatinine, Urine: 114.4 mg/dL
Microalb/Creat Ratio: 5 mg/g creat (ref 0–29)
Microalbumin, Urine: 5.4 ug/mL

## 2018-09-19 LAB — IRON,TIBC AND FERRITIN PANEL
Ferritin: 16 ng/mL (ref 15–150)
Iron Saturation: 14 % — ABNORMAL LOW (ref 15–55)
Iron: 47 ug/dL (ref 27–159)
Total Iron Binding Capacity: 343 ug/dL (ref 250–450)
UIBC: 296 ug/dL (ref 131–425)

## 2018-09-19 LAB — MEASLES/MUMPS/RUBELLA IMMUNITY
MUMPS ABS, IGG: 74.4 AU/mL (ref >10.9)
RUBEOLA AB, IGG: 74 AU/mL (ref >16.4)
Rubella Antibodies, IGG: 5.56 index (ref >0.99)

## 2018-09-19 LAB — HEMOGLOBIN A1C
Est. average glucose Bld gHb Est-mCnc: 123 mg/dL
Hgb A1c MFr Bld: 5.9 % — ABNORMAL HIGH (ref 4.8–5.6)

## 2018-09-19 LAB — TSH: TSH: 1.24 u[IU]/mL (ref 0.450–4.500)

## 2018-09-19 LAB — VITAMIN D 25 HYDROXY (VIT D DEFICIENCY, FRACTURES): Vit D, 25-Hydroxy: 20.8 ng/mL — ABNORMAL LOW (ref 30.0–100.0)

## 2018-09-19 LAB — T4, FREE: Free T4: 1.1 ng/dL (ref 0.82–1.77)

## 2018-09-19 LAB — VITAMIN B12: Vitamin B-12: 552 pg/mL (ref 232–1245)

## 2018-09-19 LAB — HEPATITIS B SURFACE ANTIBODY, QUANTITATIVE: Hepatitis B Surf Ab Quant: 42.8 m[IU]/mL (ref >9.9)

## 2018-12-25 ENCOUNTER — Other Ambulatory Visit: Payer: Self-pay

## 2018-12-25 ENCOUNTER — Encounter: Payer: Self-pay | Admitting: Internal Medicine

## 2018-12-25 ENCOUNTER — Ambulatory Visit (INDEPENDENT_AMBULATORY_CARE_PROVIDER_SITE_OTHER): Payer: BC Managed Care – PPO | Admitting: Internal Medicine

## 2018-12-25 VITALS — Ht 64.0 in | Wt 182.0 lb

## 2018-12-25 DIAGNOSIS — Z0000 Encounter for general adult medical examination without abnormal findings: Secondary | ICD-10-CM

## 2018-12-25 DIAGNOSIS — E119 Type 2 diabetes mellitus without complications: Secondary | ICD-10-CM

## 2018-12-25 MED ORDER — LANCETS THIN MISC: 1.0000 | Freq: Every day | 11 refills | Status: DC | PRN

## 2018-12-25 MED ORDER — GLUCOSE BLOOD VI STRP: ORAL_STRIP | 12 refills | Status: DC

## 2018-12-25 NOTE — Progress Notes (Signed)
Virtual Visit via Video Note  I connected with Terri Ho   on 12/25/18 at  8:30 AM EST by a video enabled telemedicine application and verified that I am speaking with the correct person using two identifiers.  Location patient: home Location provider:work or home office Persons participating in the virtual visit: patient, provider  I discussed the limitations of evaluation and management by telemedicine and the availability of in person appointments. The patient expressed understanding and agreed to proceed.   HPI: 1. Annual  2. Dm 2 controlled on metformin 500 mg qd needs strips and lancels  3. Obesity losing weight and trying taking adipex 18.25 1x per week by Dr. Darnell Level 4. Vitamin D def 20.8 and iron def taking 1x per week rec D3 4000 Iu qd and iron daily in mvt   ROS: See pertinent positives and negatives per HPI. General: wt stable  HEENT: no sore throat  CV: no chest pain  Lungs: no sob  GI: no ab pain  GU: spotting with IUD MSK: no issues Skin: no issues  Psych: no mood d/o  Neuro: no h/a  Past Medical History:  Diagnosis Date  . Diabetes mellitus 04/06/2012   TYPE 2; NON INSULIN DEP x 17 years as of 09/17/18   . Family history of breast cancer 06/2016   cancer genetic testing discussed, pt considering  . Iron deficiency   . Supervision of high risk pregnancy, antepartum, first trimester 03/28/2017   Clinic [facility] Prenatal Labs Dating 8wk [place] Blood type: O/Positive/-- (02/22 1504)  Genetic Screen  NIPS: normal XY Antibody:Negative (02/22 1504) Anatomic [place]  Rubella: 3.62 (02/22 1504) Varicella: Immune GTT  Has diabetes RPR: Non Reactive (02/22 1504)  Rhogam  HBsAg: Negative (02/22 1504)  TDaP vaccine                        Flu Shot: HIV: Non Reactive (02/22 1504)  Baby Food                      . Vitamin D deficiency     Past Surgical History:  Procedure Laterality Date  . BARIATRIC SURGERY  01/2014   gastric sleeve Dr. Kreg Shropshire   . BUTTOCK LIFT     11/2015 Dr.  Margaretha Glassing in [place]  . CESAREAN SECTION  10/28/2012  . CESAREAN SECTION N/A 11/03/2017   Procedure: CESAREAN SECTION- URGENT;  Surgeon: Will Bonnet, MD;  Location: [facility] ORS;  Service: Obstetrics;  Laterality: N/A;  . TUMMY TUCK  11/2015   Dr. Margaretha Glassing     Family History  Problem Relation Age of Onset  . Breast cancer Mother 11       again at 38  . Hypertension Mother   . Cancer Mother        breast age 32/27 and in her 42s  . Congestive Heart Failure Father   . COPD Father   . Diabetes Father        TYPE 2  . Heart disease Father        chf    SOCIAL HX:  Has 9 mo old son  Works Psychologist, counselling   Current Outpatient Medications:  .  ACCU-CHEK GUIDE test strip, USE AS INSTRUCTED 4 TIMES DAILY, Disp: 100 each, Rfl: 12 .  Blood Glucose Monitoring Suppl (ACCU-CHEK GUIDE) w/Device KIT, CHECK BLOOD SUGAR FOR FASTING & 2 HOURS AFTER BREAKFAST, LUNCH ,& DINNER. (4 TIMES DAILY), Disp: 1 kit, Rfl: 0 .  metFORMIN (GLUCOPHAGE) 500 MG tablet, Qd with breakfast, Disp: 90 tablet, Rfl: 3 .  Multiple Vitamins-Minerals (ALIVE WOMENS GUMMY) CHEW, Chew by mouth., Disp: , Rfl:  .  phentermine (ADIPEX-P) 37.5 MG tablet, TAKE 1 TABLET (37.5 MG TOTAL) BY MOUTH EVERY MORNING BEFORE BREAKFAST FOR 60 DAYS., Disp: , Rfl:  .  glucose blood test strip, Use as instructed, Disp: 100 each, Rfl: 12 .  Lancets Thin MISC, 1 Device by Does not apply route daily as needed., Disp: 100 each, Rfl: 11  EXAM:  VITALS per patient if applicable:  GENERAL: alert, oriented, appears well and in no acute distress  HEENT: atraumatic, conjunttiva clear, no obvious abnormalities on inspection of external nose and ears  NECK: normal movements of the head and neck  LUNGS: on inspection no signs of respiratory distress, breathing rate appears normal, no obvious gross SOB, gasping or wheezing  CV: no obvious cyanosis  MS: moves all visible extremities without noticeable abnormality  PSYCH/NEURO: pleasant and cooperative, no obvious  depression or anxiety, speech and thought processing grossly intact  ASSESSMENT AND PLAN:  Discussed the following assessment and plan:  Annual physical exam Flu shot utd 11/16/18 tdap utd  Consider pna 23 vaccine in future  hep B immune   FH breast cancer in her mother in her 54s and 70s she has not had genetic testing, consider in future with ob/gyn  -offered mammogram today pt prev wants to do in spring/summer 2021 [facility] has paraguard c/w bleeding with sex  -advised pt to disc with ob/gyn and call and make appt Pap 06/05/2016 neg HPV neg pap h/o abnormal pap in the past  Declines std testing  eye will go [facility] needs to sch  rec healthy diet and exercise  Iron daily in mvt and D3 4000 IU daily   Type 2 diabetes mellitus controlled A1C 5.9/ 09/18/2018 - Plan: Lancets Thin MISC, glucose blood test strip Cont metformin 500 mg qd and healthy diet choices  Will sch eye exam at [facility]  Need to do foot exam at f/u   -we discussed possible serious and likely etiologies, options for evaluation and workup, limitations of telemedicine visit vs in person visit, treatment, treatment risks and precautions. Pt prefers to treat via telemedicine empirically rather then risking or undertaking an in person visit at this moment. Patient agrees to seek prompt in person care if worsening, new symptoms arise, or if is not improving with treatment.   I discussed the assessment and treatment plan with the patient. The patient was provided an opportunity to ask questions and all were answered. The patient agreed with the plan and demonstrated an understanding of the instructions.   The patient was advised to call back or seek an in-person evaluation if the symptoms worsen or if the condition fails to improve as anticipated.  Time spent 15 minutes  Delorise Jackson, MD

## 2019-03-23 NOTE — Telephone Encounter (Signed)
Paragard rcvd/charged 12/16/17

## 2019-04-09 ENCOUNTER — Telehealth: Payer: Self-pay | Admitting: General Practice

## 2019-04-09 DIAGNOSIS — A749 Chlamydial infection, unspecified: Secondary | ICD-10-CM

## 2019-04-12 ENCOUNTER — Ambulatory Visit: Payer: BC Managed Care – PPO | Admitting: Advanced Practice Midwife

## 2019-04-12 ENCOUNTER — Other Ambulatory Visit: Payer: Self-pay

## 2019-04-12 ENCOUNTER — Ambulatory Visit: Payer: BC Managed Care – PPO | Admitting: Obstetrics and Gynecology

## 2019-04-12 DIAGNOSIS — B9689 Other specified bacterial agents as the cause of diseases classified elsewhere: Secondary | ICD-10-CM

## 2019-04-12 DIAGNOSIS — Z113 Encounter for screening for infections with a predominantly sexual mode of transmission: Secondary | ICD-10-CM

## 2019-04-12 DIAGNOSIS — N760 Acute vaginitis: Secondary | ICD-10-CM

## 2019-04-12 DIAGNOSIS — D219 Benign neoplasm of connective and other soft tissue, unspecified: Secondary | ICD-10-CM

## 2019-04-12 LAB — WET PREP FOR TRICH, YEAST, CLUE
Trichomonas Exam: NEGATIVE
Yeast Exam: NEGATIVE

## 2019-04-12 MED ORDER — METRONIDAZOLE 500 MG PO TABS: 500.0000 mg | ORAL_TABLET | Freq: Two times a day (BID) | ORAL | 0 refills | Status: AC

## 2019-04-12 NOTE — Progress Notes (Signed)
Wet mount reviewed by provider; per verbal by Donnal Moat, CNM, pt treated for BV per standing order. Provider orders completed.

## 2019-04-12 NOTE — Progress Notes (Signed)
[facility] Department STI clinic/screening visit  Subjective:  Terri Ho is a 34 y.o.nonsmoker G5P2 BF female being seen today for an STI screening visit. The patient reports they do have symptoms.  Patient reports that they do not desire a pregnancy in the next year.   They reported they are not interested in discussing contraception today.  No LMP recorded.   Patient has the following medical conditions:   Patient Active Problem List   Diagnosis Date Noted  . Iron deficiency 09/17/2018  . Vaginal spotting 09/17/2018  . History of gastric surgery--tummy tuck 2014 09/17/2018  . Postpartum care following cesarean delivery 11/05/2017  . PROM (premature rupture of membranes) 11/03/2017  . S/P abdominoplasty 05/12/2017  . Obesity (BMI 30-39.9) 04/11/2017  . Obstructive sleep apnea of adult 03/28/2017  . Type 2 diabetes mellitus (HCC)--on Metformin 03/28/2017  . Vitamin B12 deficiency (non anemic) 03/28/2017  . Vitamin D deficiency 03/28/2017  . Obesity, unspecified 10/20/2013    Chief Complaint  Patient presents with  . SEXUALLY TRANSMITTED DISEASE    STD screening (no bloodwork)    HPI  Patient reports external vaginal itching x 2 wks, vaginal bleeding x 1 week, malodor  See flowsheet for further details and programmatic requirements.    The following portions of the patient's history were reviewed and updated as appropriate: allergies, current medications, past medical history, past social history, past surgical history and problem list.  Objective:  There were no vitals filed for this visit.  Physical Exam Vitals and nursing note reviewed.  Constitutional:      Appearance: Normal appearance.  HENT:     Head: Normocephalic and atraumatic.     Mouth/Throat:     Mouth: Mucous membranes are moist.     Pharynx: Oropharynx is clear. No oropharyngeal exudate or posterior oropharyngeal erythema.  Pulmonary:     Effort: Pulmonary effort is normal.   Abdominal:     General: Abdomen is flat.     Palpations: Abdomen is rigid. There is no mass.     Tenderness: There is no abdominal tenderness. There is no rebound.     Comments: Firm abdomen posterior to umbilicus--pt states she has a full bladder and had a tummmy tuck 2014  Genitourinary:    General: Normal vulva.     Exam position: Lithotomy position.     Pubic Area: No rash or pubic lice.      Labia:        Right: No rash or lesion.        Left: No rash or lesion.      Vagina: Bleeding (moderate red menses malodorous blood) present. No vaginal discharge, erythema or lesions.     Cervix: No cervical motion tenderness, discharge, friability, lesion or erythema.     Uterus: Normal. Enlarged (uterus enlarged ~16 wks size).      Rectum: Normal.     Comments: Uterus firm and enlarged to 16 wks size--pt states had a tummy tuck and has a full bladder currently and has fibroids IUD strings palpated Lymphadenopathy:     Head:     Right side of head: No preauricular or posterior auricular adenopathy.     Left side of head: No preauricular or posterior auricular adenopathy.     Cervical: No cervical adenopathy.     Upper Body:     Right upper body: No supraclavicular or axillary adenopathy.     Left upper body: No supraclavicular or axillary adenopathy.     Lower  Body: No right inguinal adenopathy. No left inguinal adenopathy.  Skin:    General: Skin is warm and dry.     Findings: No rash.  Neurological:     Mental Status: She is alert and oriented to person, place, and time.      Assessment and Plan:  Terri Ho is a 34 y.o. female presenting to the [facility] Department for STI screening  1. Screening examination for venereal disease Immunization nurse consult Treat wet mount per standing orders Pt counseled needs to have u/s and evaluation by Ob/Gyn for uterine fibroids (pt states she has insurance and will schedule own appt) - WET PREP FOR [place], YEAST, [place] culture     No follow-ups on file.  Future Appointments  Date Time Provider [place]  04/14/2019  [id number] AM Copland, Deirdre Evener, PA-C [facility] None  05/12/2019  8:30 AM McLean-Scocuzza, [name], MD [id number] [place], CNM

## 2019-04-13 NOTE — Progress Notes (Deleted)
McLean-Scocuzza, Terri Glow, MD   No chief complaint on file.   HPI:      [name] is a 34 y.o. [phone] who LMP was No LMP recorded., presents today for *** STD testing 04/12/19?!?!?!   Past Medical History:  Diagnosis Date  . Diabetes mellitus 04/06/2012   TYPE 2; NON INSULIN DEP x 17 years as of 09/17/18   . Family history of breast cancer 06/2016   cancer genetic testing discussed, pt considering  . Iron deficiency   . Supervision of high risk pregnancy, antepartum, first trimester 03/28/2017   Clinic [facility] Prenatal Labs Dating 8wk [place] Blood type: O/Positive/-- (02/22 1504)  Genetic Screen  NIPS: normal XY Antibody:Negative (02/22 1504) Anatomic [place]  Rubella: 3.62 (02/22 1504) Varicella: Immune GTT  Has diabetes RPR: Non Reactive (02/22 1504)  Rhogam  HBsAg: Negative (02/22 1504)  TDaP vaccine                        Flu Shot: HIV: Non Reactive (02/22 1504)  Baby Food                      . Vitamin D deficiency     Past Surgical History:  Procedure Laterality Date  . BARIATRIC SURGERY  01/2014   gastric sleeve Dr. Kreg Shropshire   . BUTTOCK LIFT     11/2015 Dr. Margaretha Glassing in [place]  . CESAREAN SECTION  10/28/2012  . CESAREAN SECTION N/A 11/03/2017   Procedure: CESAREAN SECTION- URGENT;  Surgeon: Will Bonnet, MD;  Location: [facility] ORS;  Service: Obstetrics;  Laterality: N/A;  . TUMMY TUCK  11/2015   Dr. Margaretha Glassing     Family History  Problem Relation Age of Onset  . Breast cancer Mother 40       again at 59  . Hypertension Mother   . Cancer Mother        breast age 76/27 and in her 43s  . Congestive Heart Failure Father   . COPD Father   . Diabetes Father        TYPE 2  . Heart disease Father        chf    Social History   Socioeconomic History  . Marital status: Married    Spouse name: Not on file  . Number of children: 1  . Years of education: 51  . Highest education level: Not on file  Occupational History  . Occupation: nurse    Comment: [employer]  Tobacco  Use  . Smoking status: Never Smoker  . Smokeless tobacco: Never Used  Substance and Sexual Activity  . Alcohol use: No    Alcohol/week: 0.0 standard drinks  . Drug use: No  . Sexual activity: Yes    Birth control/protection: None  Other Topics Concern  . Not on file  Social History Narrative   2 kids 34 month boy and 17 y.o boy as of 09/17/2018    Married husband DPR [employer] [phone]   Works part time [employer] health or [employer]    Social Determinants of Health   Financial Resource Strain:   . Difficulty of Paying Living Expenses: Not on file  Food Insecurity:   . Worried About Charity fundraiser in the Last Year: Not on file  . Ran Out of Food in the Last Year: Not on file  Transportation Needs:   . Lack of Transportation (Medical): Not on  file  . Lack of Transportation (Non-Medical): Not on file  Physical Activity:   . Days of Exercise per Week: Not on file  . Minutes of Exercise per Session: Not on file  Stress:   . Feeling of Stress : Not on file  Social Connections:   . Frequency of Communication with Friends and Family: Not on file  . Frequency of Social Gatherings with Friends and Family: Not on file  . Attends Religious Services: Not on file  . Active Member of Clubs or Organizations: Not on file  . Attends Archivist Meetings: Not on file  . Marital Status: Not on file  Intimate Partner Violence:   . Fear of Current or Ex-Partner: Not on file  . Emotionally Abused: Not on file  . Physically Abused: Not on file  . Sexually Abused: Not on file    Outpatient Medications Prior to Visit  Medication Sig Dispense Refill  . ACCU-CHEK GUIDE test strip USE AS INSTRUCTED 4 TIMES DAILY 100 each 12  . Blood Glucose Monitoring Suppl (ACCU-CHEK GUIDE) w/Device KIT CHECK BLOOD SUGAR FOR FASTING & 2 HOURS AFTER BREAKFAST, LUNCH ,& DINNER. (4 TIMES DAILY) 1 kit 0  . glucose blood test strip Use as instructed 100 each 12  . Lancets Thin MISC 1 Device  by Does not apply route daily as needed. 100 each 11  . metFORMIN (GLUCOPHAGE) 500 MG tablet Qd with breakfast 90 tablet 3  . metroNIDAZOLE (FLAGYL) 500 MG tablet Take 1 tablet (500 mg total) by mouth 2 (two) times daily for 7 days. 14 tablet 0  . Multiple Vitamins-Minerals (ALIVE WOMENS GUMMY) CHEW Chew by mouth.    . phentermine (ADIPEX-P) 37.5 MG tablet 18.25-37.5 mg. Taking 1x per week     No facility-administered medications prior to visit.      ROS:  Review of Systems BREAST: No symptoms   OBJECTIVE:   Vitals:  There were no vitals taken for this visit.  Physical Exam  Results: Results for orders placed or performed in visit on 04/12/19 (from the past 24 hour(s))  WET PREP FOR [place], YEAST, CLUE     Status: None   Collection Time: 04/12/19  9:42 AM  Result Value Ref Range   Trichomonas Exam Negative Negative   Yeast Exam Negative Negative   Clue Cell Exam Comment: Negative   Narrative   Performed at:  [facility] [address] [place], [place]  [id number] Lab Director: Alphonse Guild MT, Phone:  [phone]     Assessment/Plan: No diagnosis found.    No orders of the defined types were placed in this encounter.     No follow-ups on file.  Katrenia Alkins B. Josely Moffat, PA-C 04/13/2019 9:25 AM

## 2019-04-14 ENCOUNTER — Ambulatory Visit: Payer: BC Managed Care – PPO | Admitting: Obstetrics and Gynecology

## 2019-04-16 LAB — GONOCOCCUS CULTURE

## 2019-04-20 MED ORDER — AZITHROMYCIN 500 MG PO TABS: ORAL_TABLET | ORAL | 0 refills | Status: DC

## 2019-04-20 NOTE — Telephone Encounter (Signed)
TC from patient. Verified ID via password/SS#. Informed of positive chlamydia and need for tx. Instructed to have partner call for tx appt.  Reports just started new  job and would like [facility] be called into pharmacy. RN will get ok from provider and send Rx to [facility] Aileen Fass, RN

## 2019-04-20 NOTE — Telephone Encounter (Signed)
Ok to send Rx to patient pharmacy per C. Hampton PA VO. Azmcn 1gm PO once with food. Sharyn Lull Reginald Mangels

## 2019-05-12 ENCOUNTER — Encounter: Payer: Self-pay | Admitting: Internal Medicine

## 2019-05-12 ENCOUNTER — Telehealth: Payer: Self-pay | Admitting: Internal Medicine

## 2019-05-12 ENCOUNTER — Telehealth (INDEPENDENT_AMBULATORY_CARE_PROVIDER_SITE_OTHER): Payer: BC Managed Care – PPO | Admitting: Internal Medicine

## 2019-05-12 VITALS — Ht 64.0 in | Wt 180.0 lb

## 2019-05-12 DIAGNOSIS — E559 Vitamin D deficiency, unspecified: Secondary | ICD-10-CM | POA: Diagnosis not present

## 2019-05-12 DIAGNOSIS — M79675 Pain in left toe(s): Secondary | ICD-10-CM | POA: Insufficient documentation

## 2019-05-12 DIAGNOSIS — E119 Type 2 diabetes mellitus without complications: Secondary | ICD-10-CM | POA: Diagnosis not present

## 2019-05-12 DIAGNOSIS — Z1231 Encounter for screening mammogram for malignant neoplasm of breast: Secondary | ICD-10-CM | POA: Diagnosis not present

## 2019-05-12 DIAGNOSIS — D509 Iron deficiency anemia, unspecified: Secondary | ICD-10-CM

## 2019-05-12 DIAGNOSIS — E669 Obesity, unspecified: Secondary | ICD-10-CM | POA: Diagnosis not present

## 2019-05-12 HISTORY — DX: Pain in left toe(s): M79.675

## 2019-05-12 MED ORDER — METFORMIN HCL 500 MG PO TABS: ORAL_TABLET | ORAL | 3 refills | Status: DC

## 2019-05-12 NOTE — Progress Notes (Signed)
Virtual Visit via Video Note  I connected with Terri Ho  on 05/12/19 at  8:45 AM EDT by a video enabled telemedicine application and verified that I am speaking with the correct person using two identifiers.  Location patient: car Location provider:work or home office Persons participating in the virtual visit: patient, provider  I discussed the limitations of evaluation and management by telemedicine and the availability of in person appointments. The patient expressed understanding and agreed to proceed.   HPI: 1. DM 2 needs fasting labs saw [facility] in [place] will get records  2. Pending wants breast reduction with [facility] in [place] liposuction [place] 06/29/19 same practice rec mammo prior to breast reduction. She wants insurance to cover procedure advised this would be up to plastics to try to get approved for her  3. C/o left great toe pain x 5 months on and off today 4/10 nothing tried FH gout no h/o trauma nothing tried    ROS: See pertinent positives and negatives per HPI.  Past Medical History:  Diagnosis Date  . Diabetes mellitus 04/06/2012   TYPE 2; NON INSULIN DEP x 17 years as of 09/17/18   . Family history of breast cancer 06/2016   cancer genetic testing discussed, pt considering  . Iron deficiency   . Supervision of high risk pregnancy, antepartum, first trimester 03/28/2017   [facility]inic [facility] Prenatal Labs Dating 8wk [place] Blood type: O/Positive/-- (02/22 1504)  Genetic Screen  NIPS: normal XY Antibody:Negative (02/22 1504) Anatomic [place]  Rubella: 3.62 (02/22 1504) Varicella: Immune GTT  Has diabetes RPR: Non Reactive (02/22 1504)  Rhogam  HBsAg: Negative (02/22 1504)  TDaP vaccine                        Flu Shot: HIV: Non Reactive (02/22 1504)  Baby Food                      . Vitamin D deficiency     Past Surgical History:  Procedure Laterality Date  . BARIATRIC SURGERY  01/2014   gastric sleeve Dr. Kreg Shropshire   . BUTTOCK LIFT     11/2015 Dr. Margaretha Glassing in [place]  . CESAREAN  SECTION  10/28/2012  . CESAREAN SECTION N/A 11/03/2017   Procedure: CESAREAN SECTION- URGENT;  Surgeon: Will Bonnet, MD;  Location: [facility] ORS;  Service: Obstetrics;  Laterality: N/A;  . TUMMY TUCK  11/2015   Dr. Margaretha Glassing     Family History  Problem Relation Age of Onset  . Breast cancer Mother 7       again at 20  . Hypertension Mother   . Cancer Mother        breast age 47/27 and in her 59s  . Congestive Heart Failure Father   . COPD Father   . Diabetes Father        TYPE 2  . Heart disease Father        chf    SOCIAL HX: lives at home   Current Outpatient Medications:  .  ACCU-CHEK GUIDE test strip, USE AS INSTRUCTED 4 TIMES DAILY, Disp: 100 each, Rfl: 12 .  Blood Glucose Monitoring Suppl (ACCU-CHEK GUIDE) w/Device KIT, CHECK BLOOD SUGAR FOR FASTING & 2 HOURS AFTER BREAKFAST, LUNCH ,& DINNER. (4 TIMES DAILY), Disp: 1 kit, Rfl: 0 .  glucose blood test strip, Use as instructed, Disp: 100 each, Rfl: 12 .  Lancets Thin MISC, 1 Device by Does  not apply route daily as needed., Disp: 100 each, Rfl: 11 .  metFORMIN (GLUCOPHAGE) 500 MG tablet, Qd with breakfast, Disp: 90 tablet, Rfl: 3 .  Multiple Vitamins-Minerals (ALIVE WOMENS GUMMY) CHEW, Chew by mouth., Disp: , Rfl:  .  phentermine (ADIPEX-P) 37.5 MG tablet, 18.25-37.5 mg. Taking 1x per week, Disp: , Rfl:   EXAM:  VITALS per patient if applicable:  GENERAL: alert, oriented, appears well and in no acute distress  HEENT: atraumatic, conjunttiva clear, no obvious abnormalities on inspection of external nose and ears  NECK: normal movements of the head and neck  LUNGS: on inspection no signs of respiratory distress, breathing rate appears normal, no obvious gross SOB, gasping or wheezing  CV: no obvious cyanosis  MS: moves all visible extremities without noticeable abnormality  PSYCH/NEURO: pleasant and cooperative, no obvious depression or anxiety, speech and thought processing grossly intact  ASSESSMENT AND  PLAN:  Discussed the following assessment and plan:  Type 2 diabetes mellitus without complication, unspecified whether long term insulin use (HCC) - Plan: metFORMIN (GLUCOPHAGE) 500 MG tablet  Comprehensive metabolic panel, CBC w/Diff, Lipid panel, Hemoglobin A1c Get copy of eye exam   Obesity (BMI 30-39.9) Liposuction sch 06/29/19 [facility] in [place]  Vitamin D deficiency - Plan: Vitamin D (25 hydroxy)  Great toe pain, left - Plan: Uric acid  Iron deficiency anemia, unspecified iron deficiency anemia type - Plan: CBC w/Diff, Iron, TIBC and Ferritin Panel  HM Flu shot utd 11/16/18 tdap utd Consider pna 23 vaccine in future hep B immune, MMR immune covid 19 vaccine had 2/2   FH breast cancer in her mother in her 87s and 27s she has not had genetic testing, consider in future with ob/gyn  -offered mammogram today pt prev wants to do in spring/summer 2021 [facility] ordered   [facility] has paraguard c/w bleeding with sex  -advised pt to disc with ob/gyn and call and make appt Pap 06/05/2016 neg HPV neg pap h/o abnormal pap in the past  -due 06/2019 [facility]   Declines std testing  eye will go [facility] needs to sch  rec healthy diet and exercise  Iron daily in mvt and D3 4000 IU daily   -we discussed possible serious and likely etiologies, options for evaluation and workup, limitations of telemedicine visit vs in person visit, treatment, treatment risks and precautions. Pt prefers to treat via telemedicine empirically rather then risking or undertaking an in person visit at this moment. Patient agrees to seek prompt in person care if worsening, new symptoms arise, or if is not improving with treatment.   I discussed the assessment and treatment plan with the patient. The patient was provided an opportunity to ask questions and all were answered. The patient agreed with the plan and demonstrated an understanding of the instructions.   The patient was advised to call back or  seek an in-person evaluation if the symptoms worsen or if the condition fails to improve as anticipated. Time spent 20 minutes  Delorise Jackson, MD

## 2019-05-12 NOTE — Telephone Encounter (Signed)
Lm to call office to schedule non-fasting lab asap and 65m follow up.

## 2019-05-12 NOTE — Patient Instructions (Signed)
Gout  Gout is painful swelling of your joints. Gout is a type of arthritis. It is caused by having too much uric acid in your body. Uric acid is a chemical that is made when your body breaks down substances called purines. If your body has too much uric acid, sharp crystals can form and build up in your joints. This causes pain and swelling. Gout attacks can happen quickly and be very painful (acute gout). Over time, the attacks can affect more joints and happen more often (chronic gout). What are the causes?  Too much uric acid in your blood. This can happen because: ? Your kidneys do not remove enough uric acid from your blood. ? Your body makes too much uric acid. ? You eat too many foods that are high in purines. These foods include organ meats, some seafood, and beer.  Trauma or stress. What increases the risk?  Having a family history of gout.  Being female and middle-aged.  Being female and having gone through menopause.  Being very overweight (obese).  Drinking alcohol, especially beer.  Not having enough water in the body (being dehydrated).  Losing weight too quickly.  Having an organ transplant.  Having lead poisoning.  Taking certain medicines.  Having kidney disease.  Having a skin condition called psoriasis. What are the signs or symptoms? An attack of acute gout usually happens in just one joint. The most common place is the big toe. Attacks often start at night. Other joints that may be affected include joints of the feet, ankle, knee, fingers, wrist, or elbow. Symptoms of an attack may include:  Very bad pain.  Warmth.  Swelling.  Stiffness.  Shiny, red, or purple skin.  Tenderness. The affected joint may be very painful to touch.  Chills and fever. Chronic gout may cause symptoms more often. More joints may be involved. You may also have white or yellow lumps (tophi) on your hands or feet or in other areas near your joints. How is this  treated?  Treatment for this condition has two phases: treating an acute attack and preventing future attacks.  Acute gout treatment may include: ? NSAIDs. ? Steroids. These are taken by mouth or injected into a joint. ? Colchicine. This medicine relieves pain and swelling. It can be given by mouth or through an IV tube.  Preventive treatment may include: ? Taking small doses of NSAIDs or colchicine daily. ? Using a medicine that reduces uric acid levels in your blood. ? Making changes to your diet. You may need to see a food expert (dietitian) about what to eat and drink to prevent gout. Follow these instructions at home: During a gout attack   If told, put ice on the painful area: ? Put ice in a plastic bag. ? Place a towel between your skin and the bag. ? Leave the ice on for 20 minutes, 2-3 times a day.  Raise (elevate) the painful joint above the level of your heart as often as you can.  Rest the joint as much as possible. If the joint is in your leg, you may be given crutches.  Follow instructions from your doctor about what you cannot eat or drink. Avoiding future gout attacks  Eat a low-purine diet. Avoid foods and drinks such as: ? Liver. ? Kidney. ? Anchovies. ? Asparagus. ? Herring. ? Mushrooms. ? Mussels. ? Beer.  Stay at a healthy weight. If you want to lose weight, talk with your doctor. Do not lose weight   too fast.  Start or continue an exercise plan as told by your doctor. Eating and drinking  Drink enough fluids to keep your pee (urine) pale yellow.  If you drink alcohol: ? Limit how much you use to:  0-1 drink a day for women.  0-2 drinks a day for men. ? Be aware of how much alcohol is in your drink. In the [place] one drink equals one 12 oz bottle of beer (355 mL), one 5 oz glass of wine (148 mL), or one 1 oz glass of hard liquor (44 mL). General instructions  Take over-the-counter and prescription medicines only as told by your doctor.  Do  not drive or use heavy machinery while taking prescription pain medicine.  Return to your normal activities as told by your doctor. Ask your doctor what activities are safe for you.  Keep all follow-up visits as told by your doctor. This is important. Contact a doctor if:  You have another gout attack.  You still have symptoms of a gout attack after 10 days of treatment.  You have problems (side effects) because of your medicines.  You have chills or a fever.  You have burning pain when you pee (urinate).  You have pain in your lower back or belly. Get help right away if:  You have very bad pain.  Your pain cannot be controlled.  You cannot pee. Summary  Gout is painful swelling of the joints.  The most common site of pain is the big toe, but it can affect other joints.  Medicines and avoiding some foods can help to prevent and treat gout attacks. This information is not intended to replace advice given to you by your health care provider. Make sure you discuss any questions you have with your health care provider. Document Revised: 08/13/2017 Document Reviewed: 08/13/2017 Elsevier Patient Education  2020 Elsevier Inc.    Low-Purine Eating Plan A low-purine eating plan involves making food choices to limit your intake of purine. Purine is a kind of uric acid. Too much uric acid in your blood can cause certain conditions, such as gout and kidney stones. Eating a low-purine diet can help control these conditions. What are tips for following this plan? Reading food labels   Avoid foods with saturated or Trans fat.  Check the ingredient list of grains-based foods, such as bread and cereal, to make sure that they contain whole grains.  Check the ingredient list of sauces or soups to make sure they do not contain meat or fish.  When choosing soft drinks, check the ingredient list to make sure they do not contain high-fructose corn syrup. Shopping  Buy plenty of fresh  fruits and vegetables.  Avoid buying canned or fresh fish.  Buy dairy products labeled as low-fat or nonfat.  Avoid buying premade or processed foods. These foods are often high in fat, salt (sodium), and added sugar. Cooking  Use olive oil instead of butter when cooking. Oils like olive oil, canola oil, and sunflower oil contain healthy fats. Meal planning  Learn which foods do or do not affect you. If you find out that a food tends to cause your gout symptoms to flare up, avoid eating that food. You can enjoy foods that do not cause problems. If you have any questions about a food item, talk with your dietitian or health care provider.  Limit foods high in fat, especially saturated fat. Fat makes it harder for your body to get rid of uric acid.    Choose foods that are lower in fat and are lean sources of protein. General guidelines  Limit alcohol intake to no more than 1 drink a day for nonpregnant women and 2 drinks a day for men. One drink equals 12 oz of beer, 5 oz of wine, or 1 oz of hard liquor. Alcohol can affect the way your body gets rid of uric acid.  Drink plenty of water to keep your urine clear or pale yellow. Fluids can help remove uric acid from your body.  If directed by your health care provider, take a vitamin C supplement.  Work with your health care provider and dietitian to develop a plan to achieve or maintain a healthy weight. Losing weight can help reduce uric acid in your blood. What foods are recommended? The items listed may not be a complete list. Talk with your dietitian about what dietary choices are best for you. Foods low in purines Foods low in purines do not need to be limited. These include:  All fruits.  All low-purine vegetables, pickles, and olives.  Breads, pasta, rice, cornbread, and popcorn. Cake and other baked goods.  All dairy foods.  Eggs, nuts, and nut butters.  Spices and condiments, such as salt, herbs, and vinegar.  Plant  oils, butter, and margarine.  Water, sugar-free soft drinks, tea, coffee, and cocoa.  Vegetable-based soups, broths, sauces, and gravies. Foods moderate in purines Foods moderate in purines should be limited to the amounts listed.   cup of asparagus, cauliflower, spinach, mushrooms, or green peas, each day.  2/3 cup uncooked oatmeal, each day.   cup dry wheat bran or wheat germ, each day.  2-3 ounces of meat or poultry, each day.  4-6 ounces of shellfish, such as crab, lobster, oysters, or shrimp, each day.  1 cup cooked beans, peas, or lentils, each day.  Soup, broths, or bouillon made from meat or fish. Limit these foods as much as possible. What foods are not recommended? The items listed may not be a complete list. Talk with your dietitian about what dietary choices are best for you. Limit your intake of foods high in purines, including:  Beer and other alcohol.  Meat-based gravy or sauce.  Canned or fresh fish, such as: ? Anchovies, sardines, herring, and tuna. ? Mussels and scallops. ? Codfish, trout, and haddock.  Bacon.  Organ meats, such as: ? Liver or kidney. ? Tripe. ? Sweetbreads (thymus gland or pancreas).  Wild game or goose.  Yeast or yeast extract supplements.  Drinks sweetened with high-fructose corn syrup. Summary  Eating a low-purine diet can help control conditions caused by too much uric acid in the body, such as gout or kidney stones.  Choose low-purine foods, limit alcohol, and limit foods high in fat.  You will learn over time which foods do or do not affect you. If you find out that a food tends to cause your gout symptoms to flare up, avoid eating that food. This information is not intended to replace advice given to you by your health care provider. Make sure you discuss any questions you have with your health care provider. Document Revised: 01/03/2017 Document Reviewed: 03/06/2016 Elsevier Patient Education  2020 Elsevier  Inc.   

## 2019-05-12 NOTE — Telephone Encounter (Signed)
pls get eye exam records [facility] in [place]   [place]

## 2019-05-14 NOTE — Telephone Encounter (Signed)
Called [facility] in [place] to request records. [phone]) They require a release of information form to be sent.

## 2019-05-14 NOTE — Telephone Encounter (Signed)
Sheaffer-Poudrier, Hilbert Odor  05/12/19 10:25 AM Note   Lm to call office to schedule non-fasting lab asap and 65m follow up.

## 2019-05-14 NOTE — Telephone Encounter (Signed)
Pt scheduled for fasting labs for 04/14 at 8 30 am. She will sign the release form then.

## 2019-05-19 ENCOUNTER — Other Ambulatory Visit: Payer: BC Managed Care – PPO

## 2019-05-26 NOTE — Telephone Encounter (Signed)
Patient no-showed today's appointment; appointment was for 4/14 lab appointment. Will send pt a mychart to re-schedule. Also needing her to sign a release of information form.

## 2019-08-26 ENCOUNTER — Telehealth: Payer: Self-pay | Admitting: Internal Medicine

## 2019-09-28 NOTE — Progress Notes (Deleted)
McLean-Scocuzza, [name], MD   No chief complaint on file.   HPI:      [name] is a 34 y.o. [id number] whose LMP was No LMP recorded., presents today for *** Pos chlamydia at [facility] 04/12/19, ? TOC   Past Medical History:  Diagnosis Date   Diabetes mellitus 04/06/2012   TYPE 2; NON INSULIN DEP x 17 years as of 09/17/18    Family history of breast cancer 06/2016   cancer genetic testing discussed, pt considering   Iron deficiency    Supervision of high risk pregnancy, antepartum, first trimester 03/28/2017   Clinic [facility] Prenatal Labs Dating 8wk [place] Blood type: O/Positive/-- (02/22 1504)  Genetic Screen  NIPS: normal XY Antibody:Negative (02/22 1504) Anatomic [place]  Rubella: 3.62 (02/22 1504) Varicella: Immune GTT  Has diabetes RPR: Non Reactive (02/22 1504)  Rhogam  HBsAg: Negative (02/22 1504)  TDaP vaccine                        Flu Shot: HIV: Non Reactive (02/22 1504)  Baby Food                       Vitamin D deficiency     Past Surgical History:  Procedure Laterality Date   BARIATRIC SURGERY  01/2014   gastric sleeve Dr. Kreg Shropshire    BUTTOCK LIFT     11/2015 Dr. Margaretha Glassing in [place]  10/28/2012   CESAREAN SECTION N/A 11/03/2017   Procedure: CESAREAN SECTION- URGENT;  Surgeon: Will Bonnet, MD;  Location: [facility] ORS;  Service: Obstetrics;  Laterality: N/A;   TUMMY TUCK  11/2015   Dr. Margaretha Glassing     Family History  Problem Relation Age of Onset   Breast cancer Mother 60       again at 68   Hypertension Mother    Cancer Mother        breast age 35/27 and in her 54s   Congestive Heart Failure Father    COPD Father    Diabetes Father        TYPE 2   Heart disease Father        chf    Social History   Socioeconomic History   Marital status: Married    Spouse name: Not on file   Number of children: 1   Years of education: 16   Highest education level: Not on file  Occupational History   Occupation: nurse    Comment: Warden/r[employer]   Tobacco Use   Smoking status: Never Smoker   Smokeless tobacco: Never Used  Scientific laboratory technician Use: Never used  Substance and Sexual Activity   Alcohol use: No    Alcohol/week: 0.0 standard drinks   Drug use: No   Sexual activity: Yes    Birth control/protection: None  Other Topics Concern   Not on file  Social History Narrative   2 kids 76 month boy and 43 y.o boy as of 09/17/2018    Married husband DPR [employer] [phone]   Works part time [employer] or [employer]    Social Determinants of Radio broadcast assistant Strain:    Difficulty of Paying Living Expenses: Not on file  Food Insecurity:    Worried About Charity fundraiser in the Last Year: Not on file   [employer] of Food in the Last Year: Not on  file  Transportation Needs:    Film/video editor (Medical): Not on file   Lack of Transportation (Non-Medical): Not on file  Physical Activity:    Days of Exercise per Week: Not on file   Minutes of Exercise per Session: Not on file  Stress:    Feeling of Stress : Not on file  Social Connections:    Frequency of Communication with Friends and Family: Not on file   Frequency of Social Gatherings with Friends and Family: Not on file   Attends Religious Services: Not on file   Active Member of Clubs or Organizations: Not on file   Attends Archivist Meetings: Not on file   Marital Status: Not on file  Intimate Partner Violence:    Fear of Current or Ex-Partner: Not on file   Emotionally Abused: Not on file   Physically Abused: Not on file   Sexually Abused: Not on file    Outpatient Medications Prior to Visit  Medication Sig Dispense Refill   ACCU-CHEK GUIDE test strip USE AS INSTRUCTED 4 TIMES DAILY 100 each 12   Blood Glucose Monitoring Suppl (ACCU-CHEK GUIDE) w/Device KIT CHECK BLOOD SUGAR FOR FASTING & 2 HOURS AFTER BREAKFAST, LUNCH ,& DINNER. (4 TIMES DAILY) 1 kit 0   glucose blood test strip Use as  instructed 100 each 12   Lancets Thin MISC 1 Device by Does not apply route daily as needed. 100 each 11   metFORMIN (GLUCOPHAGE) 500 MG tablet Qd with breakfast 90 tablet 3   Multiple Vitamins-Minerals (ALIVE WOMENS GUMMY) CHEW Chew by mouth.     phentermine (ADIPEX-P) 37.5 MG tablet 18.25-37.5 mg. Taking 1x per week     No facility-administered medications prior to visit.      ROS:  Review of Systems BREAST: No symptoms   OBJECTIVE:   Vitals:  There were no vitals taken for this visit.  Physical Exam  Results: No results found for this or any previous visit (from the past 24 hour(s)).   Assessment/Plan: No diagnosis found.    No orders of the defined types were placed in this encounter.     No follow-ups on file.  [name] B. Roni Friberg, PA-C 09/28/2019 4:50 PM

## 2019-09-29 ENCOUNTER — Other Ambulatory Visit: Payer: Self-pay

## 2019-09-29 ENCOUNTER — Ambulatory Visit (INDEPENDENT_AMBULATORY_CARE_PROVIDER_SITE_OTHER): Payer: BC Managed Care – PPO | Admitting: Obstetrics and Gynecology

## 2019-09-29 ENCOUNTER — Ambulatory Visit: Payer: BC Managed Care – PPO | Admitting: Obstetrics and Gynecology

## 2019-09-29 ENCOUNTER — Other Ambulatory Visit (HOSPITAL_COMMUNITY)
Admission: RE | Admit: 2019-09-29 | Discharge: 2019-09-29 | Disposition: A | Discharge status: Home / Self Care | Payer: BC Managed Care – PPO | Source: Ambulatory Visit | Attending: Obstetrics and Gynecology | Admitting: Obstetrics and Gynecology

## 2019-09-29 ENCOUNTER — Encounter: Payer: Self-pay | Admitting: Obstetrics and Gynecology

## 2019-09-29 VITALS — BP 100/60 | Ht 64.0 in | Wt 173.0 lb

## 2019-09-29 DIAGNOSIS — A749 Chlamydial infection, unspecified: Secondary | ICD-10-CM | POA: Insufficient documentation

## 2019-09-29 DIAGNOSIS — Z113 Encounter for screening for infections with a predominantly sexual mode of transmission: Secondary | ICD-10-CM | POA: Insufficient documentation

## 2019-09-29 NOTE — Progress Notes (Signed)
McLean-Scocuzza, Terri Glow, MD   Chief Complaint  Patient presents with  . STD testing    HPI:      Terri Ho is a 34 y.o. [id number] whose LMP was Patient's last menstrual period was 09/08/2019 (approximate)., presents today for STD testing. Had chlamydia at [facility] 04/12/19, did treatment, hasn't had TOC. Husband did tx. Pt wants to make sure it's gone. No vag sx. Hx of BV but no odor/d/c.  Pt is sex active, has paragard IUD.  Hx of leio.   Past Medical History:  Diagnosis Date  . Diabetes mellitus 04/06/2012   TYPE 2; NON INSULIN DEP x 17 years as of 09/17/18   . Family history of breast cancer 06/2016   cancer genetic testing discussed, pt considering  . Iron deficiency   . Leiomyoma   . Supervision of high risk pregnancy, antepartum, first trimester 03/28/2017   [facility]inic [facility] P[facility]atal Labs Dating 8wk [place] Blood type: O/Positive/-- (02/22 1504)  Genetic Screen  NIPS: normal XY Antibody:Negative (02/22 1504) Anatomic [place]  Rubella: 3.62 (02/22 1504) Varicella: Immune GTT  Has diabetes RPR: Non Reactive (02/22 1504)  Rhogam  HBsAg: Negative (02/22 1504)  TDaP vaccine                        Flu Shot: HIV: Non Reactive (02/22 1504)  Baby Food                      . Vitamin D deficiency     Past Surgical History:  Procedure Laterality Date  . BARIATRIC SURGERY  01/2014   gastric sleeve Dr. Kreg Shropshire   . BUTTOCK LIFT     11/2015 Dr. Margaretha Glassing in [place]  . CESAREAN SECTION  10/28/2012  . CESAREAN SECTION N/A 11/03/2017   Procedure: CESAREAN SECTION- URGENT;  Surgeon: Will Bonnet, MD;  Location: [facility] ORS;  Service: Obstetrics;  Laterality: N/A;  . TUMMY TUCK  11/2015   Dr. Margaretha Glassing     Family History  Problem Relation Age of Onset  . Breast cancer Mother 88       again at 65  . Hypertension Mother   . Cancer Mother        breast age 34/27 and in her 80s  . Congestive Heart Failure Father   . COPD Father   . Diabetes Father        TYPE 2  . Heart disease Father        chf     Social History   Socioeconomic History  . Marital status: Married    Spouse name: Not on file  . Number of children: 1  . Years of education: 58  . Highest education level: Not on file  Occupational History  . Occupation: nurse    Comment: [employer]  Tobacco Use  . Smoking status: Never Smoker  . Smokeless tobacco: Never Used  Vaping Use  . Vaping Use: Never used  Substance and Sexual Activity  . Alcohol use: No    Alcohol/week: 0.0 standard drinks  . Drug use: No  . Sexual activity: Yes    Birth control/protection: I.U.D.    Comment: Paragard  Other Topics Concern  . Not on file  Social History Narrative   2 kids 90 month boy and 69 y.o boy as of 09/17/2018    Married husband DPR [employer] [phone]   Works part time [employer] or [employer]  Social Determinants of Health   Financial Resource Strain:   . Difficulty of Paying Living Expenses: Not on file  Food Insecurity:   . Worried About Charity fundraiser in the Last Year: Not on file  . Ran Out of Food in the Last Year: Not on file  Transportation Needs:   . Lack of Transportation (Medical): Not on file  . Lack of Transportation (Non-Medical): Not on file  Physical Activity:   . Days of Exercise per Week: Not on file  . Minutes of Exercise per Session: Not on file  Stress:   . Feeling of Stress : Not on file  Social Connections:   . Frequency of Communication with Friends and Family: Not on file  . Frequency of Social Gatherings with Friends and Family: Not on file  . Attends Religious Services: Not on file  . Active Member of Clubs or Organizations: Not on file  . Attends Archivist Meetings: Not on file  . Marital Status: Not on file  Intimate Partner Violence:   . Fear of Current or Ex-Partner: Not on file  . Emotionally Abused: Not on file  . Physically Abused: Not on file  . Sexually Abused: Not on file    Outpatient Medications Prior to Visit  Medication Sig Dispense  Refill  . PARAGARD INTRAUTERINE COPPER IU by Intrauterine route.    . phentermine (ADIPEX-P) 37.5 MG tablet 18.25-37.5 mg. Taking 1x per week    . ACCU-CHEK GUIDE test strip USE AS INSTRUCTED 4 TIMES DAILY (Patient not taking: Reported on 09/29/2019) 100 each 12  . Blood Glucose Monitoring Suppl (ACCU-CHEK GUIDE) w/Device KIT CHECK BLOOD SUGAR FOR FASTING & 2 HOURS AFTER BREAKFAST, LUNCH ,& DINNER. (4 TIMES DAILY) (Patient not taking: Reported on 09/29/2019) 1 kit 0  . glucose blood test strip Use as instructed (Patient not taking: Reported on 09/29/2019) 100 each 12  . Lancets Thin MISC 1 Device by Does not apply route daily as needed. (Patient not taking: Reported on 09/29/2019) 100 each 11  . metFORMIN (GLUCOPHAGE) 500 MG tablet Qd with breakfast 90 tablet 3  . Multiple Vitamins-Minerals (ALIVE WOMENS GUMMY) CHEW Chew by mouth.     No facility-administered medications prior to visit.      ROS:  Review of Systems  Constitutional: Negative for fever.  Gastrointestinal: Negative for blood in stool, constipation, diarrhea, nausea and vomiting.  Genitourinary: Negative for dyspareunia, dysuria, flank pain, frequency, hematuria, urgency, vaginal bleeding, vaginal discharge and vaginal pain.  Musculoskeletal: Negative for back pain.  Skin: Negative for rash.   BREAST: No symptoms   OBJECTIVE:   Vitals:  BP 100/60   Ht '5\' 4"'  (1.626 m)   Wt 173 lb (78.5 kg)   LMP 09/08/2019 (Approximate)   Breastfeeding No   BMI 29.70 kg/m   Physical Exam Vitals reviewed.  Constitutional:      Appearance: She is well-developed.  Pulmonary:     Effort: Pulmonary effort is normal.  Genitourinary:    General: Normal vulva.     Pubic Area: No rash.      Labia:        Right: No rash, tenderness or lesion.        Left: No rash, tenderness or lesion.      Vagina: Normal. No vaginal discharge, erythema or tenderness.     Cervix: Normal.     Uterus: Normal. Enlarged. Not tender.      Adnexa: Right  adnexa normal and left adnexa normal.  Right: No mass or tenderness.         Left: No mass or tenderness.       Comments: C/W leio Musculoskeletal:        General: Normal range of motion.     Cervical back: Normal range of motion.  Skin:    General: Skin is warm and dry.  Neurological:     General: No focal deficit present.     Mental Status: She is alert and oriented to person, place, and time.  Psychiatric:        Mood and Affect: Mood normal.        Behavior: Behavior normal.        Thought Content: Thought content normal.        Judgment: Judgment normal.     Assessment/Plan: Chlamydia - Plan: Cervicovaginal ancillary only; TOC today. Will f/u with results if pos.   Screening for STD (sexually transmitted disease) - Plan: Cervicovaginal ancillary only    Return if symptoms worsen or fail to improve.  Maela Takeda B. Diego Ulbricht, PA-C 09/29/2019 3:35 PM

## 2019-09-29 NOTE — Patient Instructions (Signed)
I value your feedback and entrusting us with your care. If you get a [facility] patient survey, I would appreciate you taking the time to let us know about your experience today. Thank you!  As of January 14, 2019, your lab results will be released to your MyChart immediately, before I even have a chance to see them. Please give me time to review them and contact you if there are any abnormalities. Thank you for your patience.  

## 2019-10-01 LAB — CERVICOVAGINAL ANCILLARY ONLY
Chlamydia: NEGATIVE
Comment: NEGATIVE
Comment: NEGATIVE
Comment: NORMAL
Neisseria Gonorrhea: NEGATIVE
Trichomonas: NEGATIVE

## 2019-11-05 ENCOUNTER — Other Ambulatory Visit (HOSPITAL_COMMUNITY): Payer: BC Managed Care – PPO

## 2019-11-09 ENCOUNTER — Ambulatory Visit (HOSPITAL_BASED_OUTPATIENT_CLINIC_OR_DEPARTMENT_OTHER): Admit: 2019-11-09 | Payer: BC Managed Care – PPO | Admitting: Plastic Surgery

## 2019-11-09 ENCOUNTER — Encounter (HOSPITAL_BASED_OUTPATIENT_CLINIC_OR_DEPARTMENT_OTHER): Payer: Self-pay

## 2019-11-09 SURGERY — MAMMOPLASTY, REDUCTION
Anesthesia: General | Laterality: Bilateral

## 2019-12-19 ENCOUNTER — Emergency Department (INDEPENDENT_AMBULATORY_CARE_PROVIDER_SITE_OTHER)
Admission: EM | Admit: 2019-12-19 | Discharge: 2019-12-19 | Disposition: A | Discharge status: Home / Self Care | Payer: BC Managed Care – PPO | Source: Home / Self Care

## 2019-12-19 ENCOUNTER — Encounter: Payer: Self-pay | Admitting: Emergency Medicine

## 2019-12-19 ENCOUNTER — Emergency Department
Admission: RE | Admit: 2019-12-19 | Discharge: 2019-12-19 | Discharge status: Absent without leave | Payer: BC Managed Care – PPO | Source: Ambulatory Visit

## 2019-12-19 ENCOUNTER — Other Ambulatory Visit (HOSPITAL_COMMUNITY)
Admission: RE | Admit: 2019-12-19 | Discharge: 2019-12-19 | Disposition: A | Discharge status: Home / Self Care | Payer: BC Managed Care – PPO | Source: Ambulatory Visit | Attending: Family Medicine | Admitting: Family Medicine

## 2019-12-19 ENCOUNTER — Other Ambulatory Visit: Payer: Self-pay

## 2019-12-19 DIAGNOSIS — R1011 Right upper quadrant pain: Secondary | ICD-10-CM | POA: Diagnosis not present

## 2019-12-19 DIAGNOSIS — R1031 Right lower quadrant pain: Secondary | ICD-10-CM | POA: Insufficient documentation

## 2019-12-19 DIAGNOSIS — N898 Other specified noninflammatory disorders of vagina: Secondary | ICD-10-CM | POA: Insufficient documentation

## 2019-12-19 DIAGNOSIS — Z202 Contact with and (suspected) exposure to infections with a predominantly sexual mode of transmission: Secondary | ICD-10-CM | POA: Diagnosis not present

## 2019-12-19 DIAGNOSIS — N760 Acute vaginitis: Secondary | ICD-10-CM | POA: Diagnosis not present

## 2019-12-19 DIAGNOSIS — B9689 Other specified bacterial agents as the cause of diseases classified elsewhere: Secondary | ICD-10-CM | POA: Insufficient documentation

## 2019-12-19 DIAGNOSIS — N3001 Acute cystitis with hematuria: Secondary | ICD-10-CM

## 2019-12-19 LAB — POCT URINALYSIS DIP (MANUAL ENTRY)
Bilirubin, UA: NEGATIVE
Blood, UA: NEGATIVE
Glucose, UA: NEGATIVE mg/dL
Ketones, POC UA: NEGATIVE mg/dL
Nitrite, UA: NEGATIVE
Protein Ur, POC: NEGATIVE mg/dL
Spec Grav, UA: >=1.03 — AB (ref 1.010–1.025)
Urobilinogen, UA: 0.2 E.U./dL
pH, UA: 5.5 (ref 5.0–8.0)

## 2019-12-19 NOTE — ED Provider Notes (Signed)
[facility]    CSN: [id number] Arrival date & time: 12/19/19  1003      History   Chief Complaint Chief Complaint  Patient presents with  . Appointment  . Vaginal Discharge  . Exposure to STD    HPI Terri Ho is a 34 y.o. female.   HPI  Terri Ho is a 34 y.o. female presenting to UC with c/o 4 days gradually worsening Right side abdominal pain. Pain started in RUQ but has progressed into lower abdomen.  She has also noticed increased vaginal discharge. She had an IUD placed in December 2019 but has had chlamydia earlier in the year, concerned for PID.  Denies fever, chills, n/v/d. Pt denies hx of gallbladder issues but mother has had gallstones.      Past Medical History:  Diagnosis Date  . Diabetes mellitus 04/06/2012   TYPE 2; NON INSULIN DEP x 17 years as of 09/17/18   . Family history of breast cancer 06/2016   cancer genetic testing discussed, pt considering  . Iron deficiency   . Leiomyoma   . Supervision of high risk pregnancy, antepartum, first trimester 03/28/2017   Clinic [facility] Prenatal Labs Dating 8wk [place] Blood type: O/Positive/-- (02/22 1504)  Genetic Screen  NIPS: normal XY Antibody:Negative (02/22 1504) Anatomic [place]  Rubella: 3.62 (02/22 1504) Varicella: Immune GTT  Has diabetes RPR: Non Reactive (02/22 1504)  Rhogam  HBsAg: Negative (02/22 1504)  TDaP vaccine                        Flu Shot: HIV: Non Reactive (02/22 1504)  Baby Food                      . Vitamin D deficiency     Patient Active Problem List   Diagnosis Date Noted  . Chlamydia 09/29/2019  . Great toe pain, left 05/12/2019  . Fibroids per pt 04/12/2019  . Iron deficiency 09/17/2018  . Vaginal spotting 09/17/2018  . History of gastric surgery--tummy tuck 2014 09/17/2018  . Postpartum care following cesarean delivery 11/05/2017  . PROM (premature rupture of membranes) 11/03/2017  . S/P abdominoplasty 05/12/2017  . Obesity (BMI 30-39.9) 04/11/2017  . Obstructive sleep  apnea of adult 03/28/2017  . Type 2 diabetes mellitus (HCC)--on Metformin 03/28/2017  . Vitamin B12 deficiency (non anemic) 03/28/2017  . Vitamin D deficiency 03/28/2017  . Obesity, unspecified 10/20/2013    Past Surgical History:  Procedure Laterality Date  . BARIATRIC SURGERY  01/2014   gastric sleeve Dr. Kreg Shropshire   . BUTTOCK LIFT     11/2015 Dr. Margaretha Glassing in [place]  . CESAREAN SECTION  10/28/2012  . CESAREAN SECTION N/A 11/03/2017   Procedure: CESAREAN SECTION- URGENT;  Surgeon: Will Bonnet, MD;  Location: [facility] ORS;  Service: Obstetrics;  Laterality: N/A;  . TUMMY TUCK  11/2015   Dr. Margaretha Glassing     OB History    Gravida  5   Para  2   Term  2   Preterm  0   AB  3   Living  2     SAB  1   TAB  2   Ectopic      Multiple  0   Live Births  2            Home Medications    Prior to Admission medications   Medication Sig Start Date End Date Taking? Authorizing Provider  ACCU-CHEK GUIDE test strip USE AS INSTRUCTED 4 TIMES DAILY Patient not taking: Reported on 09/29/2019 05/06/18   Homero Fellers, MD  Blood Glucose Monitoring Suppl (ACCU-CHEK GUIDE) w/Device KIT CHECK BLOOD SUGAR FOR FASTING & 2 HOURS AFTER BREAKFAST, LUNCH ,& DINNER. (4 TIMES DAILY) Patient not taking: Reported on 09/29/2019 05/25/18   Adrian Prows R, MD  glucose blood test strip Use as instructed Patient not taking: Reported on 09/29/2019 12/25/18   McLean-Scocuzza, [name], MD  Lancets Thin MISC 1 Device by Does not apply route daily as needed. Patient not taking: Reported on 09/29/2019 12/25/18   McLean-Scocuzza, [name], MD  [facility] INTRAUTERINE COPPER IU by Intrauterine route. 12/16/17   [provider]  phentermine (ADIPEX-P) 37.5 MG tablet 18.25-37.5 mg. Taking 1x per week 08/25/18   [provider]    Family History Family History  Problem Relation Age of Onset  . Breast cancer Mother 58       again at 72  . Hypertension Mother   . Cancer Mother         breast age 84/27 and in her 55s  . Congestive Heart Failure Father   . COPD Father   . Diabetes Father        TYPE 2  . Heart disease Father        chf    Social History Social History   Tobacco Use  . Smoking status: Never Smoker  . Smokeless tobacco: Never Used  Vaping Use  . Vaping Use: Never used  Substance Use Topics  . Alcohol use: No    Alcohol/week: 0.0 standard drinks  . Drug use: No     Allergies   Patient has no known allergies.   Review of Systems Review of Systems  Constitutional: Negative for chills and fever.  HENT: Negative for congestion, ear pain, sore throat, trouble swallowing and voice change.   Respiratory: Negative for cough and shortness of breath.   Cardiovascular: Negative for chest pain and palpitations.  Gastrointestinal: Positive for abdominal pain. Negative for diarrhea, nausea and vomiting.  Genitourinary: Positive for flank pain (Right), pelvic pain and vaginal discharge. Negative for decreased urine volume, dysuria, urgency, vaginal bleeding and vaginal pain.  Musculoskeletal: Negative for arthralgias, back pain and myalgias.  Skin: Negative for rash.  All other systems reviewed and are negative.    Physical Exam Triage Vital Signs ED Triage Vitals  Enc Vitals Group     BP 12/19/19 1036 115/77     Pulse Rate 12/19/19 1036 74     Resp 12/19/19 1036 15     Temp 12/19/19 1036 98 F (36.7 C)     Temp Source 12/19/19 1036 Oral     SpO2 12/19/19 1036 97 %     Weight 12/19/19 1037 170 lb (77.1 kg)     Height 12/19/19 1037 '5\' 4"'  (1.626 m)     Head Circumference --      Peak Flow --      Pain Score 12/19/19 1037 0     Pain Loc --      Pain Edu? --      Excl. in [place]? --    No data found.  Updated Vital Signs BP 115/77 (BP Location: Right Arm)   Pulse 74   Temp 98 F (36.7 C) (Oral)   Resp 15   Ht '5\' 4"'  (1.626 m)   Wt 170 lb (77.1 kg)   LMP 12/05/2019 (Exact Date) Comment: IUD placed 01/2018- heavy menses  per pt  SpO2 97%    BMI 29.18 kg/m   Visual Acuity Right Eye Distance:   Left Eye Distance:   Bilateral Distance:    Right Eye Near:   Left Eye Near:    Bilateral Near:     Physical Exam Vitals and nursing note reviewed.  Constitutional:      General: She is not in acute distress.    Appearance: Normal appearance. She is well-developed. She is not ill-appearing, toxic-appearing or diaphoretic.  HENT:     Head: Normocephalic and atraumatic.  Cardiovascular:     Rate and Rhythm: Normal rate and regular rhythm.  Pulmonary:     Effort: Pulmonary effort is normal. No respiratory distress.     Breath sounds: Normal breath sounds. No stridor. No wheezing, rhonchi or rales.  Abdominal:     General: There is no distension.     Palpations: Abdomen is soft. There is no mass.     Tenderness: There is abdominal tenderness. There is guarding and rebound. There is no right CVA tenderness or left CVA tenderness.     Hernia: No hernia is present.    Musculoskeletal:        General: Normal range of motion.     Cervical back: Normal range of motion.  Skin:    General: Skin is warm and dry.  Neurological:     Mental Status: She is alert and oriented to person, place, and time.  Psychiatric:        Behavior: Behavior normal.      UC Treatments / Results  Labs (all labs ordered are listed, but only abnormal results are displayed) Labs Reviewed  POCT URINALYSIS DIP (MANUAL ENTRY) - Abnormal; Notable for the following components:      Result Value   Color, UA other (*)    Clarity, UA turbid (*)    Spec Grav, UA >=1.030 (*)    Leukocytes, UA Small (1+) (*)    All other components within normal limits  CERVICOVAGINAL ANCILLARY ONLY    EKG   Radiology No results found.  Procedures Procedures (including critical care time)  Medications Ordered in UC Medications - No data to display  Initial Impression / Assessment and Plan / UC Course  I have reviewed the triage vital signs and the nursing  notes.  Pertinent labs & imaging results that were available during my care of the patient were reviewed by me and considered in my medical decision making (see chart for details).     Due to tenderness with guarding on Right side during abdominal exam, recommend further evaluation and treatment in emergency department. Pt declined EMS transport but agreeable to drive herself to [facility] for further evaluation and treatment.  AVS given.  Final Clinical Impressions(s) / UC Diagnoses   Final diagnoses:  RUQ abdominal pain  RLQ abdominal pain  Vaginal discharge     Discharge Instructions      There is concern your pain is due to your gallbladder or appendix. It is recommended you are evaluated more thoroughly in the emergency department this morning.  Infection of your gallbladder or appendix is considered a medical emergency. Do not eat or drink anything along the way in case surgery is indicated.     ED Prescriptions    None     PDMP not reviewed this encounter.   Noe Gens, PA-C 12/19/19 1101

## 2019-12-19 NOTE — Discharge Instructions (Signed)
  There is concern your pain is due to your gallbladder or appendix. It is recommended you are evaluated more thoroughly in the emergency department this morning.  Infection of your gallbladder or appendix is considered a medical emergency. Do not eat or drink anything along the way in case surgery is indicated.

## 2019-12-19 NOTE — ED Notes (Signed)
Patient is being discharged from the Urgent Care and sent to the Emergency Department via POV . Per Leeroy Cha, PA-C, patient is in need of higher level of care due to testing needed to rule out gall stones or appendicitis. Urine pregnancy not done - will be done at ER. Patient is aware and verbalizes understanding of plan of care. Pt is not sure if she will go to [facility] or [facility]. Pt verbalized an understanding not to eat or drink en route to hospital Vitals:   12/19/19 1036  BP: 115/77  Pulse: 74  Resp: 15  Temp: 98 F (36.7 C)  SpO2: 97%

## 2019-12-19 NOTE — ED Triage Notes (Signed)
Vaginal discharge (yellow to brown) wants to be tested for STD Not requesting a pelvic exam  Lower abdominal pressure - worse the last 4 days c/o frequency Pfizer March 2021

## 2019-12-21 LAB — CERVICOVAGINAL ANCILLARY ONLY
Bacterial Vaginitis (gardnerella): POSITIVE — AB
Candida Glabrata: NEGATIVE
Candida Vaginitis: NEGATIVE
Chlamydia: NEGATIVE
Comment: NEGATIVE
Comment: NEGATIVE
Comment: NEGATIVE
Comment: NEGATIVE
Comment: NEGATIVE
Comment: NORMAL
Neisseria Gonorrhea: NEGATIVE
Trichomonas: NEGATIVE

## 2019-12-21 LAB — URINE CULTURE
MICRO NUMBER:: 11202722
SPECIMEN QUALITY:: ADEQUATE

## 2019-12-22 ENCOUNTER — Telehealth (HOSPITAL_COMMUNITY): Payer: Self-pay | Admitting: Emergency Medicine

## 2019-12-22 MED ORDER — METRONIDAZOLE 500 MG PO TABS: 500.0000 mg | ORAL_TABLET | Freq: Two times a day (BID) | ORAL | 0 refills | Status: DC

## 2020-01-15 ENCOUNTER — Ambulatory Visit: Payer: BC Managed Care – PPO | Attending: Internal Medicine

## 2020-01-15 DIAGNOSIS — Z23 Encounter for immunization: Secondary | ICD-10-CM

## 2020-03-21 ENCOUNTER — Ambulatory Visit: Payer: 59

## 2020-03-23 ENCOUNTER — Ambulatory Visit: Payer: BC Managed Care – PPO

## 2020-06-15 ENCOUNTER — Other Ambulatory Visit: Payer: Self-pay

## 2020-06-20 ENCOUNTER — Other Ambulatory Visit: Payer: Self-pay

## 2020-06-20 ENCOUNTER — Encounter: Payer: Self-pay | Admitting: Internal Medicine

## 2020-06-20 ENCOUNTER — Ambulatory Visit (INDEPENDENT_AMBULATORY_CARE_PROVIDER_SITE_OTHER): Payer: BC Managed Care – PPO | Admitting: Internal Medicine

## 2020-06-20 VITALS — BP 114/80 | HR 78 | Temp 97.7°F | Ht 64.0 in | Wt 172.6 lb

## 2020-06-20 DIAGNOSIS — Z13220 Encounter for screening for lipoid disorders: Secondary | ICD-10-CM

## 2020-06-20 DIAGNOSIS — E559 Vitamin D deficiency, unspecified: Secondary | ICD-10-CM

## 2020-06-20 DIAGNOSIS — R1011 Right upper quadrant pain: Secondary | ICD-10-CM | POA: Diagnosis not present

## 2020-06-20 DIAGNOSIS — Z13818 Encounter for screening for other digestive system disorders: Secondary | ICD-10-CM

## 2020-06-20 DIAGNOSIS — E611 Iron deficiency: Secondary | ICD-10-CM

## 2020-06-20 DIAGNOSIS — R1084 Generalized abdominal pain: Secondary | ICD-10-CM

## 2020-06-20 DIAGNOSIS — K5900 Constipation, unspecified: Secondary | ICD-10-CM

## 2020-06-20 DIAGNOSIS — R0602 Shortness of breath: Secondary | ICD-10-CM

## 2020-06-20 DIAGNOSIS — Z9884 Bariatric surgery status: Secondary | ICD-10-CM

## 2020-06-20 DIAGNOSIS — R7303 Prediabetes: Secondary | ICD-10-CM

## 2020-06-20 DIAGNOSIS — E663 Overweight: Secondary | ICD-10-CM

## 2020-06-20 LAB — CBC WITH DIFFERENTIAL/PLATELET
Basophils Absolute: 0 10*3/uL (ref 0.0–0.1)
Basophils Relative: 0.1 % (ref 0.0–3.0)
Eosinophils Absolute: 0.2 10*3/uL (ref 0.0–0.7)
Eosinophils Relative: 3 % (ref 0.0–5.0)
HCT: 38 % (ref 36.0–46.0)
Hemoglobin: 13.1 g/dL (ref 12.0–15.0)
Lymphocytes Relative: 36.2 % (ref 12.0–46.0)
Lymphs Abs: 1.9 10*3/uL (ref 0.7–4.0)
MCHC: 34.5 g/dL (ref 30.0–36.0)
MCV: 88.8 fl (ref 78.0–100.0)
Monocytes Absolute: 0.3 10*3/uL (ref 0.1–1.0)
Monocytes Relative: 6.5 % (ref 3.0–12.0)
Neutro Abs: 2.8 10*3/uL (ref 1.4–7.7)
Neutrophils Relative %: 54.2 % (ref 43.0–77.0)
Platelets: 284 10*3/uL (ref 150.0–400.0)
RBC: 4.28 Mil/uL (ref 3.87–5.11)
RDW: 13.5 % (ref 11.5–15.5)
WBC: 5.1 10*3/uL (ref 4.0–10.5)

## 2020-06-20 LAB — COMPREHENSIVE METABOLIC PANEL
ALT: 19 U/L (ref 0–35)
AST: 18 U/L (ref 0–37)
Albumin: 4.2 g/dL (ref 3.5–5.2)
Alkaline Phosphatase: 53 U/L (ref 39–117)
BUN: 11 mg/dL (ref 6–23)
CO2: 28 mEq/L (ref 19–32)
Calcium: 9.1 mg/dL (ref 8.4–10.5)
Chloride: 103 mEq/L (ref 96–112)
Creatinine, Ser: 0.65 mg/dL (ref 0.40–1.20)
GFR: 113.11 mL/min (ref >60.00)
Glucose, Bld: 86 mg/dL (ref 70–99)
Potassium: 3.9 mEq/L (ref 3.5–5.1)
Sodium: 138 mEq/L (ref 135–145)
Total Bilirubin: 0.6 mg/dL (ref 0.2–1.2)
Total Protein: 6.5 g/dL (ref 6.0–8.3)

## 2020-06-20 LAB — VITAMIN D 25 HYDROXY (VIT D DEFICIENCY, FRACTURES): VITD: 11.66 ng/mL — ABNORMAL LOW (ref 30.00–100.00)

## 2020-06-20 LAB — TSH: TSH: 1.36 u[IU]/mL (ref 0.35–4.50)

## 2020-06-20 LAB — LIPID PANEL
Cholesterol: 136 mg/dL (ref 0–200)
HDL: 51.6 mg/dL (ref >39.00)
LDL Cholesterol: 77 mg/dL (ref 0–99)
NonHDL: 83.99
Total CHOL/HDL Ratio: 3
Triglycerides: 35 mg/dL (ref 0.0–149.0)
VLDL: 7 mg/dL (ref 0.0–40.0)

## 2020-06-20 LAB — D-DIMER, QUANTITATIVE: D-Dimer, Quant: <0.19 mcg/mL FEU (ref <0.50)

## 2020-06-20 LAB — HEMOGLOBIN A1C: Hgb A1c MFr Bld: 5.8 % (ref 4.6–6.5)

## 2020-06-20 NOTE — Progress Notes (Signed)
Chief Complaint  Patient presents with  . GI Problem  . Abdominal Pain   F/u  1. RUQ, RMQ ab pain gas constipation 12/19/2019 + constipation mild. Feels like a pulling sensation/dull ache or stretched/pulled muscle worse  1 week tried Tylenol.denies n/v  IMPRESSION:  1. No acute findings.  2. Mild fecal retention.  3. L5-S1 age indeterminant posterior disc extrusion.    Review of Systems  Constitutional: Negative for weight loss.  HENT: Negative for hearing loss.   Eyes: Negative for blurred vision.  Respiratory: Negative for shortness of breath.   Cardiovascular: Negative for chest pain.  Gastrointestinal: Positive for abdominal pain. Negative for nausea and vomiting.  Musculoskeletal: Negative for falls and joint pain.  Skin: Negative for rash.  Neurological: Negative for headaches.  Psychiatric/Behavioral: Negative for depression.   Past Medical History:  Diagnosis Date  . COVID-19    08/2019  . Diabetes mellitus 04/06/2012   TYPE 2; NON INSULIN DEP x 17 years as of 09/17/18   . Family history of breast cancer 06/2016   cancer genetic testing discussed, pt considering  . Iron deficiency   . Leiomyoma   . Supervision of high risk pregnancy, antepartum, first trimester 03/28/2017   Clinic [facility] Prenatal Labs Dating 8wk [place] Blood type: O/Positive/-- (02/22 1504)  Genetic Screen  NIPS: normal XY Antibody:Negative (02/22 1504) Anatomic [place]  Rubella: 3.62 (02/22 1504) Varicella: Immune GTT  Has diabetes RPR: Non Reactive (02/22 1504)  Rhogam  HBsAg: Negative (02/22 1504)  TDaP vaccine                        Flu Shot: HIV: Non Reactive (02/22 1504)  Baby Food                      . Vitamin D deficiency    Past Surgical History:  Procedure Laterality Date  . BARIATRIC SURGERY  01/2014   gastric sleeve Dr. Kreg Shropshire   . BUTTOCK LIFT     11/2015 Dr. Margaretha Glassing in [place]  . CESAREAN SECTION  10/28/2012  . CESAREAN SECTION N/A 11/03/2017   Procedure: CESAREAN SECTION- URGENT;  Surgeon:  Will Bonnet, MD;  Location: [facility] ORS;  Service: Obstetrics;  Laterality: N/A;  . TUMMY TUCK  11/2015   Dr. Margaretha Glassing    Family History  Problem Relation Age of Onset  . Breast cancer Mother 33       again at 60  . Hypertension Mother   . Cancer Mother        breast age 37/27 and in her 80s  . Congestive Heart Failure Father   . COPD Father   . Diabetes Father        TYPE 2  . Heart disease Father        chf   Social History   Socioeconomic History  . Marital status: Married    Spouse name: Not on file  . Number of children: 1  . Years of education: 14  . Highest education level: Not on file  Occupational History  . Occupation: nurse    Comment: [employer]  Tobacco Use  . Smoking status: Never Smoker  . Smokeless tobacco: Never Used  Vaping Use  . Vaping Use: Never used  Substance and Sexual Activity  . Alcohol use: No    Alcohol/week: 0.0 standard drinks  . Drug use: No  . Sexual activity: Yes    Birth control/protection: I.U.D.  Comment: Paragard  Other Topics Concern  . Not on file  Social History Narrative   2 kids 24 month boy and 26 y.o boy as of 09/17/2018    Married husband [employer] [phone]   Works part time [employer] health or [employer]    Social Determinants of Radio broadcast assistant Strain: Not on [employer] Insecurity: Not on file  Transportation Needs: Not on file  Physical Activity: Not on file  Stress: Not on file  Social Connections: Not on file  Intimate Partner Violence: Not on file   Current Meds  Medication Sig  . PARAGARD INTRAUTERINE COPPER IU by Intrauterine route.  . phentermine (ADIPEX-P) 37.5 MG tablet 18.25-37.5 mg. Taking 1x per week   No Known Allergies No results found for this or any previous visit (from the past 2160 hour(s)). Objective  Body mass index is 29.63 kg/m. Wt Readings from Last 3 Encounters:  06/20/20 172 lb 9.6 oz (78.3 kg)  12/19/19 170 lb (77.1 kg)  09/29/19 173 lb (78.5 kg)    Temp Readings from Last 3 Encounters:  06/20/20 97.7 F (36.5 C) (Oral)  12/19/19 98 F (36.7 C) (Oral)  09/17/18 98.2 F (36.8 C) (Temporal)   BP Readings from Last 3 Encounters:  06/20/20 114/80  12/19/19 115/77  09/29/19 100/60   Pulse Readings from Last 3 Encounters:  06/20/20 78  12/19/19 74  09/17/18 70    Physical Exam Vitals and nursing note reviewed.  Constitutional:      Appearance: Normal appearance. She is well-developed and well-groomed.  HENT:     Head: Normocephalic and atraumatic.  Cardiovascular:     Rate and Rhythm: Normal rate and regular rhythm.     Heart sounds: Normal heart sounds.  Abdominal:     General: Bowel sounds are normal.     Tenderness: There is abdominal tenderness in the right upper quadrant and right lower quadrant.  Skin:    General: Skin is warm and dry.  Neurological:     General: No focal deficit present.     Mental Status: She is alert and oriented to person, place, and time.     Gait: Gait normal.  Psychiatric:        Attention and Perception: Attention and perception normal.        Mood and Affect: Mood and affect normal.        Speech: Speech normal.        Behavior: Behavior normal. Behavior is cooperative.        Thought Content: Thought content normal.        Cognition and Memory: Cognition and memory normal.        Judgment: Judgment normal.     Assessment  Plan  Right upper quadrant pain/gen ab pain s/p weight loss surgery- Plan: Comprehensive metabolic panel, CBC with Differential/Platelet, Urinalysis, Routine w reflex microscopic, Urine Culture, D-Dimer, Quantitative, US Abdomen Complete, DG Abd 1 View  Constipation, unspecified constipation type - Plan: DG Abd 1 View  Prediabetes - Plan: TSH, Hemoglobin A1c 5.9  SOB (shortness of breath) on exertion - Plan: D-Dimer, Quantitative   HM Flu shot utd tdap utd hep B immune, MMR immune Hep C checked today covid 19 vaccine had 2/2   FH breast cancer  in her mother in her 58s and 52s she has not had genetic testing, consider in future with ob/gyn  -offered mammogram today ptprev wants to do in spring/summer 2021 [facility] ordered  [facility] has paraguard c/w bleeding with sex  -advised pt to disc with ob/gynand call and make appt Pap 06/05/2016 neg HPV neg pap h/o abnormal pap in the past  -due 06/2019 [facility]   Declines std testing  eye will go to [facility] needs to sch  rec healthy diet and exercise  Iron daily in mvt and D3 4000 IU daily rec healthy diet and exercise   Provider: Dr. Olivia Mackie McLean-Scocuzza-Internal Medicine

## 2020-06-20 NOTE — Patient Instructions (Addendum)
miralax daily as needed for constipation with colace  Gas x  Align pre and probiotics over the counter daily for constipation and gas  Avoid dairy  Keep food diary  Indigestion Indigestion is a feeling of pain, discomfort, burning, or fullness in the upper part of your abdomen. It can come and go. It may occur frequently or rarely. Indigestion tends to occur while you are eating or right after you have finished eating. It may be worse at night and while bending over or lying down. Indigestion may be a symptom of an underlying digestive condition. Follow these instructions at home: Eating and drinking  Follow an eating plan as recommended by your health care provider.  Avoid certain foods and drinks as told by your health care provider. This may include: ? Chocolate and cocoa. ? Peppermint and mint flavorings. ? Garlic and onions. ? Horseradish. ? Spicy and acidic foods, including peppers, chili powder, curry powder, vinegar, hot sauces, and barbecue sauce. ? Citrus fruits, such as oranges, lemons, and limes. ? Tomato-based foods, such as red sauce, chili, salsa, and pizza with red sauce. ? Fried and fatty foods, such as donuts, french fries, potato chips, and high-fat dressings. ? High-fat meats, such as hot dogs and fatty cuts of red and white meats, such as rib eye steak, sausage, ham, and bacon. ? High-fat dairy items, such as whole milk, butter, and cream cheese. ? Coffee and tea, with or without caffeine. ? Drinks that contain alcohol. ? Energy drinks and sports drinks. ? Carbonated drinks or sodas. ? Citrus fruit juices.  Eat small, frequent meals instead of large meals.  Avoid drinking large amounts of liquid with your meals.  Avoid eating meals during the 2-3 hours before bedtime.  Avoid lying down right after you eat.  Avoid exercise for 2 hours after you eat.   Lifestyle  Maintain a healthy weight. Ask your health care provider what weight is healthy for you. If  you need to lose weight, work with your health care provider to do so safely.  Exercise for at least 30 minutes on 5 or more days each week, or as told by your health care provider. Avoid exercises that include bending forward. This can make your symptoms worse.  Wear loose-fitting clothing. Do not wear anything tight around your waist that causes pressure on your abdomen.  Do not use any products that contain nicotine or tobacco. These products include cigarettes, chewing tobacco, and vaping devices, such as e-cigarettes. These can make symptoms worse. If you need help quitting, ask your health care provider.  Raise (elevate) the head of your bed about 6 inches (15 cm) when you sleep. You can use a wedge to do this.  Try to reduce your stress, such as with yoga or meditation. If you need help reducing stress, ask your health care provider.      General instructions  Take over-the-counter and prescription medicines only as told by your health care provider.  Do not take aspirin or NSAIDs, such as ibuprofen, unless your health care provider told you to take them.  Pay attention to any changes in your symptoms.  Keep all follow-up visits. This is important. Contact a health care provider if:  You have new symptoms.  You have unexplained weight loss.  You have difficulty swallowing, or it hurts to swallow.  Your symptoms do not improve with treatment.  Your symptoms last for more than 2 days.  You have a fever.  You vomit. Get help  right away if:  You suddenly have pain in your arms, neck, jaw, teeth, or back.  You suddenly feel sweaty, dizzy, or light-headed.  You faint.  You have chest pain or shortness of breath.  You cannot stop vomiting, or you vomit blood.  Your stool is bloody or black.  You have severe pain in your abdomen. These symptoms may represent a serious problem that is an emergency. Do not wait to see if the symptoms will go away. Get medical help  right away. Call your local emergency services (911 in the [place] Do not drive yourself to the hospital. Summary  Indigestion is a feeling of pain, discomfort, burning, or fullness in the upper part of your abdomen. It tends to occur while you are eating or right after you have finished eating.  Follow an eating plan and other lifestyle changes as told by your health care provider.  Take over-the-counter and prescription medicines only as told by your health care provider. Do not take aspirin or NSAIDs, such as ibuprofen, unless your health care provider told you to do so.  Contact your health care provider if your symptoms do not get better or they get worse. Some symptoms may represent a serious problem that is an emergency. Do not wait to see if the symptoms will go away. Get medical help right away. This information is not intended to replace advice given to you by your health care provider. Make sure you discuss any questions you have with your health care provider. Document Revised: 07/28/2019 Document Reviewed: 07/28/2019 Elsevier Patient Education  2021 [place].   Abdominal Pain, Adult Pain in the abdomen (abdominal pain) can be caused by many things. Often, abdominal pain is not serious and it gets better with no treatment or by being treated at home. However, sometimes abdominal pain is serious. Your health care provider will ask questions about your medical history and do a physical exam to try to determine the cause of your abdominal pain. Follow these instructions at home: Medicines  Take over-the-counter and prescription medicines only as told by your health care provider.  Do not take a laxative unless told by your health care provider. General instructions  Watch your condition for any changes.  Drink enough fluid to keep your urine pale yellow.  Keep all follow-up visits as told by your health care provider. This is important.   Contact a health care provider  if:  Your abdominal pain changes or gets worse.  You are not hungry or you lose weight without trying.  You are constipated or have diarrhea for more than 2-3 days.  You have pain when you urinate or have a bowel movement.  Your abdominal pain wakes you up at night.  Your pain gets worse with meals, after eating, or with certain foods.  You are vomiting and cannot keep anything down.  You have a fever.  You have blood in your urine. Get help right away if:  Your pain does not go away as soon as your health care provider told you to expect.  You cannot stop vomiting.  Your pain is only in areas of the abdomen, such as the right side or the left lower portion of the abdomen. Pain on the right side could be caused by appendicitis.  You have bloody or black stools, or stools that look like tar.  You have severe pain, cramping, or bloating in your abdomen.  You have signs of dehydration, such as: ? Dark urine, very  little urine, or no urine. ? Cracked lips. ? Dry mouth. ? Sunken eyes. ? Sleepiness. ? Weakness.  You have trouble breathing or chest pain. Summary  Often, abdominal pain is not serious and it gets better with no treatment or by being treated at home. However, sometimes abdominal pain is serious.  Watch your condition for any changes.  Take over-the-counter and prescription medicines only as told by your health care provider.  Contact a health care provider if your abdominal pain changes or gets worse.  Get help right away if you have severe pain, cramping, or bloating in your abdomen. This information is not intended to replace advice given to you by your health care provider. Make sure you discuss any questions you have with your health care provider. Document Revised: 03/12/2019 Document Reviewed: 06/01/2018 Elsevier Patient Education  [place].

## 2020-06-21 ENCOUNTER — Encounter: Payer: Self-pay | Admitting: Internal Medicine

## 2020-06-21 ENCOUNTER — Other Ambulatory Visit: Payer: Self-pay | Admitting: Internal Medicine

## 2020-06-21 DIAGNOSIS — R7303 Prediabetes: Secondary | ICD-10-CM

## 2020-06-21 DIAGNOSIS — E559 Vitamin D deficiency, unspecified: Secondary | ICD-10-CM

## 2020-06-21 HISTORY — DX: Prediabetes: R73.03

## 2020-06-21 LAB — URINALYSIS, ROUTINE W REFLEX MICROSCOPIC
Bacteria, UA: NONE SEEN /HPF
Bilirubin Urine: NEGATIVE
Glucose, UA: NEGATIVE
Hgb urine dipstick: NEGATIVE
Hyaline Cast: NONE SEEN /LPF
Ketones, ur: NEGATIVE
Nitrite: NEGATIVE
Protein, ur: NEGATIVE
RBC / HPF: NONE SEEN /HPF (ref 0–2)
Specific Gravity, Urine: 1.02 (ref 1.001–1.035)
pH: 6.5 (ref 5.0–8.0)

## 2020-06-21 LAB — HEPATITIS C ANTIBODY
Hepatitis C Ab: NONREACTIVE
SIGNAL TO CUT-OFF: 0.01 (ref <1.00)

## 2020-06-21 LAB — IRON,TIBC AND FERRITIN PANEL
%SAT: 18 % (calc) (ref 16–45)
Ferritin: 10 ng/mL — ABNORMAL LOW (ref 16–154)
Iron: 63 ug/dL (ref 40–190)
TIBC: 355 mcg/dL (calc) (ref 250–450)

## 2020-06-21 LAB — URINE CULTURE
MICRO NUMBER:: 11900309
SPECIMEN QUALITY:: ADEQUATE

## 2020-06-21 LAB — MICROSCOPIC MESSAGE

## 2020-06-21 MED ORDER — CHOLECALCIFEROL 1.25 MG (50000 UT) PO CAPS: 50000.0000 [IU] | ORAL_CAPSULE | ORAL | 1 refills | Status: DC

## 2020-07-31 ENCOUNTER — Other Ambulatory Visit: Payer: Self-pay

## 2020-07-31 ENCOUNTER — Ambulatory Visit
Admission: RE | Admit: 2020-07-31 | Discharge: 2020-07-31 | Disposition: A | Discharge status: Home / Self Care | Payer: BC Managed Care – PPO | Source: Ambulatory Visit | Attending: Internal Medicine | Admitting: Internal Medicine

## 2020-07-31 ENCOUNTER — Ambulatory Visit
Admission: EM | Admit: 2020-07-31 | Discharge: 2020-07-31 | Disposition: A | Discharge status: Home / Self Care | Payer: BC Managed Care – PPO | Source: Home / Self Care | Attending: Internal Medicine | Admitting: Internal Medicine

## 2020-07-31 DIAGNOSIS — K5900 Constipation, unspecified: Secondary | ICD-10-CM | POA: Insufficient documentation

## 2020-07-31 DIAGNOSIS — R1084 Generalized abdominal pain: Secondary | ICD-10-CM | POA: Diagnosis present

## 2020-07-31 DIAGNOSIS — R1011 Right upper quadrant pain: Secondary | ICD-10-CM

## 2020-12-21 ENCOUNTER — Ambulatory Visit: Payer: BC Managed Care – PPO | Admitting: Internal Medicine

## 2021-01-25 ENCOUNTER — Other Ambulatory Visit: Payer: Self-pay

## 2021-01-25 ENCOUNTER — Encounter: Payer: Self-pay | Admitting: Internal Medicine

## 2021-01-25 ENCOUNTER — Ambulatory Visit (INDEPENDENT_AMBULATORY_CARE_PROVIDER_SITE_OTHER): Payer: BC Managed Care – PPO | Admitting: Internal Medicine

## 2021-01-25 VITALS — BP 110/70 | HR 80 | Temp 97.9°F | Ht 64.0 in | Wt 171.0 lb

## 2021-01-25 DIAGNOSIS — Z803 Family history of malignant neoplasm of breast: Secondary | ICD-10-CM | POA: Diagnosis not present

## 2021-01-25 DIAGNOSIS — E663 Overweight: Secondary | ICD-10-CM

## 2021-01-25 DIAGNOSIS — R7303 Prediabetes: Secondary | ICD-10-CM

## 2021-01-25 DIAGNOSIS — Z1231 Encounter for screening mammogram for malignant neoplasm of breast: Secondary | ICD-10-CM

## 2021-01-25 DIAGNOSIS — Z124 Encounter for screening for malignant neoplasm of cervix: Secondary | ICD-10-CM | POA: Diagnosis not present

## 2021-01-25 DIAGNOSIS — K5900 Constipation, unspecified: Secondary | ICD-10-CM

## 2021-01-25 NOTE — Progress Notes (Signed)
Chief Complaint  Patient presents with   Follow-up   37 month f/u  1. Diabetes/pre history A1c 5.8 on ozempic 0.25 mg 1x per week causing constipation no stool in 4 days on B12 and fat burning shot Blue sky   Review of Systems  Constitutional:  Positive for weight loss.  HENT:  Negative for hearing loss.   Eyes:  Negative for blurred vision.  Respiratory:  Negative for shortness of breath.   Cardiovascular:  Negative for chest pain.  Gastrointestinal:  Negative for abdominal pain and blood in stool.  Genitourinary:  Negative for dysuria.  Musculoskeletal:  Negative for falls and joint pain.  Skin:  Negative for rash.  Neurological:  Negative for headaches.  Psychiatric/Behavioral:  Negative for depression.   Past Medical History:  Diagnosis Date   COVID-19    08/2019, 09/2020   Diabetes mellitus 04/06/2012   TYPE 2; NON INSULIN DEP x 37 years as of 09/17/18    Family history of breast cancer 06/2016   cancer genetic testing discussed, pt considering   Iron deficiency    Leiomyoma    Supervision of high risk pregnancy, antepartum, first trimester 03/28/2017   [facility] Prenatal Labs Dating 8wk [place] Blood type: O/Positive/-- (02/22 1504)  Genetic Screen  NIPS: normal XY Antibody:Negative (02/22 1504) Anatomic [place]  Rubella: 3.62 (02/22 1504) Varicella: Immune GTT  Has diabetes RPR: Non Reactive (02/22 1504)  Rhogam  HBsAg: Negative (02/22 1504)  TDaP vaccine                        Flu Shot: HIV: Non Reactive (02/22 1504)  Baby Food                       Vitamin D deficiency    Past Surgical History:  Procedure Laterality Date   BARIATRIC SURGERY  01/2014   gastric sleeve Dr. Kreg Shropshire    BUTTOCK LIFT     11/2015 Dr. Margaretha Glassing in [place]  10/28/2012   CESAREAN SECTION N/A 11/03/2017   Procedure: CESAREAN SECTION- URGENT;  Surgeon: Will Bonnet, MD;  Location: [facility] ORS;  Service: Obstetrics;  Laterality: N/A;   TUMMY TUCK  11/2015   Dr. Margaretha Glassing    Family History   Problem Relation Age of Onset   Breast cancer Mother 25       again at 52   Hypertension Mother    Cancer Mother        breast age 58/27 and in her 73s   Congestive Heart Failure Father    COPD Father    Diabetes Father        TYPE 2   Heart disease Father        chf   Social History   Socioeconomic History   Marital status: Married    Spouse name: Not on file   Number of children: 1   Years of education: 16   Highest education level: Not on file  Occupational History   Occupation: nurse    Comment: Warden/ranger  Tobacco Use   Smoking status: Never   Smokeless tobacco: Never  Vaping Use   Vaping Use: Never used  Substance and Sexual Activity   Alcohol use: No    Alcohol/week: 0.0 standard drinks   Drug use: No   Sexual activity: Yes    Birth control/protection: I.U.D.    Comment: Paragard  Other Topics Concern   Not on  file  Social History Narrative   2 kids 65 month boy and 63 y.o boy as of 09/17/2018    Married husband DPR [employer] [phone]   Works part time [employer] health or [employer]    Social Determinants of Radio broadcast assistant Strain: Not on [employer] Insecurity: Not on file  Transportation Needs: Not on file  Physical Activity: Not on file  Stress: Not on file  Social Connections: Not on file  Intimate Partner Violence: Not on file   Current Meds  Medication Sig   ACCU-CHEK GUIDE test strip USE AS INSTRUCTED 4 TIMES DAILY   Blood Glucose Monitoring Suppl (ACCU-CHEK GUIDE) w/Device KIT CHECK BLOOD SUGAR FOR FASTING & 2 HOURS AFTER BREAKFAST, LUNCH ,& DINNER. (4 TIMES DAILY)   Cholecalciferol 1.25 MG (50000 UT) capsule Take 1 capsule (50,000 Units total) by mouth once a week. d3 stop after 6 months and take 2000 to 4000 IU daily otc   glucose blood test strip Use as instructed   Lancets Thin MISC 1 Device by Does not apply route daily as needed.   OZEMPIC, 0.25 OR 0.5 MG/DOSE, 2 MG/1.5ML SOPN Inject 0.25 mg into the skin once a week.    PARAGARD INTRAUTERINE COPPER IU by Intrauterine route.   No Known Allergies No results found for this or any previous visit (from the past 2160 hour(s)). Objective  Body mass index is 29.35 kg/m. Wt Readings from Last 3 Encounters:  01/25/21 171 lb (77.6 kg)  06/20/20 172 lb 9.6 oz (78.3 kg)  12/19/19 170 lb (77.1 kg)   Temp Readings from Last 3 Encounters:  01/25/21 97.9 F (36.6 C) (Temporal)  06/20/20 97.7 F (36.5 C) (Oral)  12/19/19 98 F (36.7 C) (Oral)   BP Readings from Last 3 Encounters:  01/25/21 110/70  06/20/20 114/80  12/19/19 115/77   Pulse Readings from Last 3 Encounters:  01/25/21 80  06/20/20 78  12/19/19 74    Physical Exam Vitals and nursing note reviewed.  Constitutional:      Appearance: Normal appearance. She is well-developed and well-groomed.  HENT:     Head: Normocephalic and atraumatic.  Eyes:     Conjunctiva/sclera: Conjunctivae normal.     Pupils: Pupils are equal, round, and reactive to light.  Cardiovascular:     Rate and Rhythm: Normal rate and regular rhythm.     Heart sounds: Normal heart sounds. No murmur heard. Pulmonary:     Effort: Pulmonary effort is normal.     Breath sounds: Normal breath sounds.  Abdominal:     General: Abdomen is flat. Bowel sounds are normal.     Tenderness: There is no abdominal tenderness.  Musculoskeletal:        General: No tenderness.  Skin:    General: Skin is warm and dry.  Neurological:     General: No focal deficit present.     Mental Status: She is alert and oriented to person, place, and time. Mental status is at baseline.     Cranial Nerves: Cranial nerves 2-12 are intact.     Gait: Gait is intact.  Psychiatric:        Attention and Perception: Attention and perception normal.        Mood and Affect: Mood and affect normal.        Speech: Speech normal.        Behavior: Behavior normal. Behavior is cooperative.        Thought Content: Thought  content normal.        Cognition and  Memory: Cognition and memory normal.        Judgment: Judgment normal.    Assessment  Plan  Prediabetes A1c 5.2022 5.8  Records ROI blue sky  Constipation, unspecified constipation type  Overweight (BMI 25.0-29.9)  On ozempic 0.25 weekly   HM Flu shot utd  tdap utd   hep B immune, MMR immune Hep C neg covid 19 vaccine had 4/4 consider 5th dose    FH breast cancer in her mother in her 17s and 37s she has not had genetic testing, consider in future with ob/gyn  -referred today     [facility] has paraguard c/w bleeding with sex  -advised pt to disc with ob/gyn and call and make appt Pap 06/05/2016 neg HPV neg pap h/o abnormal pap in the past  -due 06/2019 [facility] referred today   Declines std testing   eye will go to [facility] needs to sch  rec healthy diet and exercise  Iron daily in mvt and D3 4000 IU daily  rec healthy diet and exercise  Provider: Dr. Olivia Mackie McLean-Scocuzza-[facility]

## 2021-01-25 NOTE — Patient Instructions (Addendum)
Miralx 1-2 caps + colace stool softner  Or  Senna S (laxative + colace docusate)  Warm prune juice   Milk of magnesium    Consider 5th pfizer dose   Vitamin D3 4000 to 5000 IU daily   Tirzepatide Injection What is this medication? TIRZEPATIDE (tir ZEP a tide) treats type 2 diabetes. It works by increasing insulin levels in your body, which decreases your blood sugar (glucose). Changes to diet and exercise are often combined with this medication. This medicine may be used for other purposes; ask your health care provider or pharmacist if you have questions. COMMON BRAND NAME(S): MOUNJARO What should I tell my care team before I take this medication? They need to know if you have any of these conditions: Endocrine tumors (MEN 2) or if someone in your family had these tumors Eye disease, vision problems Gallbladder disease History of pancreatitis Kidney disease Stomach or intestine problems Thyroid cancer or if someone in your family had thyroid cancer An unusual or allergic reaction to tirzepatide, other medications, foods, dyes, or preservatives Pregnant or trying to get pregnant Breast-feeding How should I use this medication? This medication is injected under the skin. You will be taught how to prepare and give it. It is given once every week (every 7 days). Keep taking it unless your health care provider tells you to stop. If you use this medication with insulin, you should inject this medication and the insulin separately. Do not mix them together. Do not give the injections right next to each other. Change (rotate) injection sites with each injection. This medication comes with INSTRUCTIONS FOR USE. Ask your pharmacist for directions on how to use this medication. Read the information carefully. Talk to your pharmacist or care team if you have questions. It is important that you put your used needles and syringes in a special sharps container. Do not put them in a trash can. If  you do not have a sharps container, call your pharmacist or care team to get one. A special MedGuide will be given to you by the pharmacist with each prescription and refill. Be sure to read this information carefully each time. Talk to your care team about the use of this medication in children. Special care may be needed. Overdosage: If you think you have taken too much of this medicine contact a poison control center or emergency room at once. NOTE: This medicine is only for you. Do not share this medicine with others. What if I miss a dose? If you miss a dose, take it as soon as you can unless it is more than 4 days (96 hours) late. If it is more than 4 days late, skip the missed dose. Take the next dose at the normal time. Do not take 2 doses within 3 days of each other. What may interact with this medication? Alcohol containing beverages Antiviral medications for HIV or AIDS Aspirin and aspirin-like medications Beta-blockers like atenolol, metoprolol, propranolol Certain medications for blood pressure, heart disease, irregular heart beat Chromium Clonidine Diuretics Female hormones, such as estrogens or progestins, birth control pills Fenofibrate Gemfibrozil Guanethidine Isoniazid Lanreotide Female hormones or anabolic steroids MAOIs like Carbex, Eldepryl, Marplan, Nardil, and Parnate Medications for weight loss Medications for allergies, asthma, cold, or cough Medications for depression, anxiety, or psychotic disturbances Niacin Nicotine NSAIDs, medications for pain and inflammation, like ibuprofen or naproxen Octreotide Other medications for diabetes, like glyburide, glipizide, or glimepiride Pasireotide Pentamidine Phenytoin Probenecid Quinolone antibiotics such as ciprofloxacin, levofloxacin, ofloxacin  Reserpine Some herbal dietary supplements Steroid medications such as prednisone or cortisone Sulfamethoxazole; trimethoprim Thyroid hormones Warfarin This list may  not describe all possible interactions. Give your health care provider a list of all the medicines, herbs, non-prescription drugs, or dietary supplements you use. Also tell them if you smoke, drink alcohol, or use illegal drugs. Some items may interact with your medicine. What should I watch for while using this medication? Visit your care team for regular checks on your progress. Drink plenty of fluids while taking this medication. Check with your care team if you get an attack of severe diarrhea, nausea, and vomiting. The loss of too much body fluid can make it dangerous for you to take this medication. A test called the HbA1C (A1C) will be monitored. This is a simple blood test. It measures your blood sugar control over the last 2 to 3 months. You will receive this test every 3 to 6 months. Learn how to check your blood sugar. Learn the symptoms of low and high blood sugar and how to manage them. Always carry a quick-source of sugar with you in case you have symptoms of low blood sugar. Examples include hard sugar candy or glucose tablets. Make sure others know that you can choke if you eat or drink when you develop serious symptoms of low blood sugar, such as seizures or unconsciousness. They must get medical help at once. Tell your care team if you have high blood sugar. You might need to change the dose of your medication. If you are sick or exercising more than usual, you might need to change the dose of your medication. Do not skip meals. Ask your care team if you should avoid alcohol. Many nonprescription cough and cold products contain sugar or alcohol. These can affect blood sugar. Pens should never be shared. Even if the needle is changed, sharing may result in passing of viruses like hepatitis or HIV. Wear a medical ID bracelet or chain, and carry a card that describes your disease and details of your medication and dosage times. Birth control may not work properly while you are taking this  medication. If you take birth control pills by mouth, your care team may recommend another type of birth control for 4 weeks after you start this medication and for 4 weeks after each increase in your dose of this medication. Ask your care team which birth control methods you should use. What side effects may I notice from receiving this medication? Side effects that you should report to your care team as soon as possible: Allergic reactions--skin rash, itching, hives, swelling of the face, lips, tongue, or throat Change in vision Dehydration--increased thirst, dry mouth, feeling faint or lightheaded, headache, dark yellow or brown urine Gallbladder problems--severe stomach pain, nausea, vomiting, fever Kidney injury--decrease in the amount of urine, swelling of the ankles, hands, or feet Pancreatitis--severe stomach pain that spreads to your back or gets worse after eating or when touched, fever, nausea, vomiting Thyroid cancer--new mass or lump in the neck, pain or trouble swallowing, trouble breathing, hoarseness Side effects that usually do not require medical attention (report these to your care team if they continue or are bothersome): Constipation Diarrhea Loss of Appetite Nausea Stomach pain Upset stomach Vomiting This list may not describe all possible side effects. Call your doctor for medical advice about side effects. You may report side effects to FDA at 1-800-FDA-1088. Where should I keep my medication? Keep out of the reach of children and pets.  Refrigeration (preferred): Store unopened pens in a refrigerator between 2 and 8 degrees C (36 and 46 degrees F). Keep it in the original carton until you are ready to take it. Do not freeze or use if the medication has been frozen. Protect from light. Get rid of any unused medication after the expiration date on the label. Room Temperature: The pen may be stored at room temperature below 30 degrees C (86 degrees F) for up to a total of  21 days if needed. Protect from light. Avoid exposure to extreme heat. If it is stored at room temperature, throw away any unused medication after 21 days or after it expires, whichever is first. The pen has glass parts. Handle it carefully. If you drop the pen on a hard surface, do not use it. Use a new pen for your injection. To get rid of medications that are no longer needed or have expired: Take the medication to a medication take-back program. Check with your pharmacy or law enforcement to find a location. If you cannot return the medication, ask your pharmacist or care team how to get rid of this medication safely. NOTE: This sheet is a summary. It may not cover all possible information. If you have questions about this medicine, talk to your doctor, pharmacist, or health care provider.  2022 Elsevier/Gold Standard (2020-06-21 00:00:00)   Constipation, Adult Constipation is when a person has fewer than three bowel movements in a week, has difficulty having a bowel movement, or has stools (feces) that are dry, hard, or larger than normal. Constipation may be caused by an underlying condition. It may become worse with age if a person takes certain medicines and does not take in enough fluids. Follow these instructions at home: Eating and drinking  Eat foods that have a lot of fiber, such as beans, whole grains, and fresh fruits and vegetables. Limit foods that are low in fiber and high in fat and processed sugars, such as fried or sweet foods. These include french fries, hamburgers, cookies, candies, and soda. Drink enough fluid to keep your urine pale yellow. General instructions Exercise regularly or as told by your health care provider. Try to do 150 minutes of moderate exercise each week. Use the bathroom when you have the urge to go. Do not hold it in. Take over-the-counter and prescription medicines only as told by your health care provider. This includes any fiber  supplements. During bowel movements: Practice deep breathing while relaxing the lower abdomen. Practice pelvic floor relaxation. Watch your condition for any changes. Let your health care provider know about them. Keep all follow-up visits as told by your health care provider. This is important. Contact a health care provider if: You have pain that gets worse. You have a fever. You do not have a bowel movement after 4 days. You vomit. You are not hungry or you lose weight. You are bleeding from the opening between the buttocks (anus). You have thin, pencil-like stools. Get help right away if: You have a fever and your symptoms suddenly get worse. You leak stool or have blood in your stool. Your abdomen is bloated. You have severe pain in your abdomen. You feel dizzy or you faint. Summary Constipation is when a person has fewer than three bowel movements in a week, has difficulty having a bowel movement, or has stools (feces) that are dry, hard, or larger than normal. Eat foods that have a lot of fiber, such as beans, whole grains, and fresh fruits  and vegetables. Drink enough fluid to keep your urine pale yellow. Take over-the-counter and prescription medicines only as told by your health care provider. This includes any fiber supplements. This information is not intended to replace advice given to you by your health care provider. Make sure you discuss any questions you have with your health care provider. Document Revised: 12/09/2018 Document Reviewed: 12/09/2018 Elsevier Patient Education  [place].

## 2021-02-04 DIAGNOSIS — Z9189 Other specified personal risk factors, not elsewhere classified: Secondary | ICD-10-CM

## 2021-02-04 DIAGNOSIS — Z1371 Encounter for nonprocreative screening for genetic disease carrier status: Secondary | ICD-10-CM

## 2021-02-04 DIAGNOSIS — Z1379 Encounter for other screening for genetic and chromosomal anomalies: Secondary | ICD-10-CM

## 2021-02-04 DIAGNOSIS — Z1509 Genetic susceptibility to other malignant neoplasm: Secondary | ICD-10-CM

## 2021-02-04 DIAGNOSIS — Z1501 Genetic susceptibility to malignant neoplasm of breast: Secondary | ICD-10-CM

## 2021-02-04 HISTORY — DX: Genetic susceptibility to malignant neoplasm of breast: Z15.01

## 2021-02-04 HISTORY — DX: Encounter for nonprocreative screening for genetic disease carrier status: Z13.71

## 2021-02-04 HISTORY — DX: Other specified personal risk factors, not elsewhere classified: Z91.89

## 2021-02-04 HISTORY — DX: Genetic susceptibility to other malignant neoplasm: Z15.09

## 2021-02-04 HISTORY — DX: Encounter for other screening for genetic and chromosomal anomalies: Z13.79

## 2021-02-15 DIAGNOSIS — Z803 Family history of malignant neoplasm of breast: Secondary | ICD-10-CM | POA: Insufficient documentation

## 2021-02-15 DIAGNOSIS — R87810 Cervical high risk human papillomavirus (HPV) DNA test positive: Secondary | ICD-10-CM | POA: Insufficient documentation

## 2021-02-15 NOTE — Progress Notes (Signed)
McLean-Scocuzza, Terri Glow, MD   Chief Complaint  Patient presents with   Referral    Pap, wants std testing added    HPI:      [name] is a 36 y.o. [id number] whose LMP was Patient's last menstrual period was 02/03/2021 (approximate)., presents today for pap smear, referred by PCP.  Neg pap 06/05/16 (no HPV DNA done); had neg pap/POS HPV DNA 01/13/14. Would like STD testing. Hx of chlamydia in past. No sx/known exposures. Hx of recurrent BV, no sx today.  Paragard placed 12/16/17; menses are monthly, lasting 4 days, occas BTB, mild dysmen. Hx of leio.  She is sex active, no pain/bleeding.  Her mom had breast cancer twice, genetic testing not done. Pt has mammo order with PCP, not done yet.   Patient Active Problem List   Diagnosis Date Noted   Family history of breast cancer 02/15/2021   Cervical high risk human papillomavirus (HPV) DNA test positive 02/15/2021   Overweight (BMI 25.0-29.9) 01/25/2021   Prediabetes 06/21/2020   Chlamydia 09/29/2019   Great toe pain, left 05/12/2019   Fibroids per pt 04/12/2019   Iron deficiency 09/17/2018   Vaginal spotting 09/17/2018   History of gastric surgery--tummy tuck 2014 09/17/2018   Postpartum care following cesarean delivery 11/05/2017   PROM (premature rupture of membranes) 11/03/2017   S/P abdominoplasty 05/12/2017   Obesity (BMI 30-39.9) 04/11/2017   Obstructive sleep apnea of adult 03/28/2017   Vitamin B12 deficiency (non anemic) 03/28/2017   Vitamin D deficiency 03/28/2017   Obesity, unspecified 10/20/2013    Past Surgical History:  Procedure Laterality Date   BARIATRIC SURGERY  01/2014   gastric sleeve Dr. Kreg Shropshire    BUTTOCK LIFT     11/2015 Dr. Margaretha Glassing in [place]  10/28/2012   CESAREAN SECTION N/A 11/03/2017   Procedure: CESAREAN SECTION- URGENT;  Surgeon: Will Bonnet, MD;  Location: [facility] ORS;  Service: Obstetrics;  Laterality: N/A;   TUMMY TUCK  11/2015   Dr. Margaretha Glassing     Family History   Problem Relation Age of Onset   Breast cancer Mother 64       again at 64   Hypertension Mother    Congestive Heart Failure Father    COPD Father    Diabetes Father        TYPE 2   Heart disease Father        chf   Kidney cancer Paternal Aunt 93    Social History   Socioeconomic History   Marital status: Married    Spouse name: Not on file   Number of children: 1   Years of education: 16   Highest education level: Not on file  Occupational History   Occupation: nurse    Comment: [employer]  Tobacco Use   Smoking status: Never   Smokeless tobacco: Never  Vaping Use   Vaping Use: Never used  Substance and Sexual Activity   Alcohol use: No    Alcohol/week: 0.0 standard drinks   Drug use: No   Sexual activity: Yes    Birth control/protection: I.U.D.    Comment: Paragard  Other Topics Concern   Not on file  Social History Narrative   2 kids 13 month boy and 44 y.o boy as of 09/17/2018    Married husband DPR [employer] [phone]   Works part time [employer] health or [employer]    Social Determinants of [employer]  Resource Strain: Not on file  Food Insecurity: Not on file  Transportation Needs: Not on file  Physical Activity: Not on file  Stress: Not on file  Social Connections: Not on file  Intimate Partner Violence: Not on file    Outpatient Medications Prior to Visit  Medication Sig Dispense Refill   Cholecalciferol 1.25 MG (50000 UT) capsule Take 1 capsule (50,000 Units total) by mouth once a week. d3 stop after 6 months and take 2000 to 4000 IU daily otc 13 capsule 1   OZEMPIC, 0.25 OR 0.5 MG/DOSE, 2 MG/1.5ML SOPN Inject 0.25 mg into the skin once a week.     PARAGARD INTRAUTERINE COPPER IU by Intrauterine route.     ACCU-CHEK GUIDE test strip USE AS INSTRUCTED 4 TIMES DAILY 100 each 12   Blood Glucose Monitoring Suppl (ACCU-CHEK GUIDE) w/Device KIT CHECK BLOOD SUGAR FOR FASTING & 2 HOURS AFTER BREAKFAST, LUNCH ,& DINNER. (4 TIMES DAILY) 1 kit 0    glucose blood test strip Use as instructed 100 each 12   Lancets Thin MISC 1 Device by Does not apply route daily as needed. 100 each 11   phentermine (ADIPEX-P) 37.5 MG tablet 18.25-37.5 mg. Taking 1x per week (Patient not taking: Reported on 01/25/2021)     No facility-administered medications prior to visit.      ROS:  Review of Systems  Constitutional:  Negative for fever.  Gastrointestinal:  Positive for constipation. Negative for blood in stool, diarrhea, nausea and vomiting.  Genitourinary:  Negative for dyspareunia, dysuria, flank pain, frequency, hematuria, urgency, vaginal bleeding, vaginal discharge and vaginal pain.  Musculoskeletal:  Negative for back pain.  Skin:  Negative for rash.  BREAST: No symptoms   OBJECTIVE:   Vitals:  BP 114/70    Ht '5\' 3"'  (1.6 m)    Wt 166 lb (75.3 kg)    LMP 02/03/2021 (Approximate)    BMI 29.41 kg/m   Physical Exam Vitals reviewed.  Constitutional:      Appearance: She is well-developed.  Pulmonary:     Effort: Pulmonary effort is normal.  Genitourinary:    General: Normal vulva.     Pubic Area: No rash.      Labia:        Right: No rash, tenderness or lesion.        Left: No rash, tenderness or lesion.      Vagina: Normal. No vaginal discharge, erythema or tenderness.     Cervix: Normal.     Adnexa: Right adnexa normal and left adnexa normal.       Right: No mass or tenderness.         Left: No mass or tenderness.       Comments: UTERUS NOT PALPABLE DUE TO TUMMY TUCK; IUD STRINGS IN CX OS Musculoskeletal:        General: Normal range of motion.     Cervical back: Normal range of motion.  Skin:    General: Skin is warm and dry.  Neurological:     General: No focal deficit present.     Mental Status: She is alert and oriented to person, place, and time.  Psychiatric:        Mood and Affect: Mood normal.        Behavior: Behavior normal.        Thought Content: Thought content normal.        Judgment: Judgment normal.      Assessment/Plan: Cervical cancer screening - Plan: Cytology - PAP,  Screening for STD (sexually transmitted disease) - Plan: Cytology - PAP,   Screening for HPV (human papillomavirus) - Plan: Cytology - PAP,   Cervical high risk human papillomavirus (HPV) DNA test positive - Plan: Cytology - PAP, repeat pap today. Will f/u if abn.    Family history of breast cancer - Plan: Integrated BRACAnalysis ([place]); MyRisk testing discussed and done today. Handout given. Will f/u with results. Pt already at increased risk for breast cancer based on FH. Encouraged pt to [facility] mammo, order placed by PCP  Encounter for routine checking of intrauterine contraceptive device (IUD)--IUD strings in cx os.     Return if symptoms worsen or fail to improve.  Daman Steffenhagen B. Francile Woolford, PA-C 02/20/2021 10:22 AM

## 2021-02-20 ENCOUNTER — Other Ambulatory Visit (HOSPITAL_COMMUNITY)
Admission: RE | Admit: 2021-02-20 | Discharge: 2021-02-20 | Disposition: A | Discharge status: Home / Self Care | Payer: BC Managed Care – PPO | Source: Ambulatory Visit | Attending: Obstetrics and Gynecology | Admitting: Obstetrics and Gynecology

## 2021-02-20 ENCOUNTER — Ambulatory Visit: Payer: BC Managed Care – PPO | Admitting: Obstetrics and Gynecology

## 2021-02-20 ENCOUNTER — Other Ambulatory Visit: Payer: Self-pay

## 2021-02-20 ENCOUNTER — Encounter: Payer: Self-pay | Admitting: Obstetrics and Gynecology

## 2021-02-20 VITALS — BP 114/70 | Ht 63.0 in | Wt 166.0 lb

## 2021-02-20 DIAGNOSIS — Z124 Encounter for screening for malignant neoplasm of cervix: Secondary | ICD-10-CM | POA: Insufficient documentation

## 2021-02-20 DIAGNOSIS — R87810 Cervical high risk human papillomavirus (HPV) DNA test positive: Secondary | ICD-10-CM

## 2021-02-20 DIAGNOSIS — Z1151 Encounter for screening for human papillomavirus (HPV): Secondary | ICD-10-CM | POA: Diagnosis present

## 2021-02-20 DIAGNOSIS — Z113 Encounter for screening for infections with a predominantly sexual mode of transmission: Secondary | ICD-10-CM | POA: Diagnosis present

## 2021-02-20 DIAGNOSIS — Z30431 Encounter for routine checking of intrauterine contraceptive device: Secondary | ICD-10-CM

## 2021-02-20 DIAGNOSIS — Z803 Family history of malignant neoplasm of breast: Secondary | ICD-10-CM

## 2021-02-20 NOTE — Addendum Note (Signed)
Addended by: Ardeth Perfect B on: 10/16/2581 10:24 AM   Modules accepted: Level of Service

## 2021-02-20 NOTE — Patient Instructions (Signed)
I value your feedback and you entrusting us with your care. If you get a [facility] patient survey, I would appreciate you taking the time to let us know about your experience today. Thank you! ? ? ?

## 2021-02-22 LAB — CYTOLOGY - PAP
Chlamydia: NEGATIVE
Comment: NEGATIVE
Comment: NEGATIVE
Comment: NEGATIVE
Comment: NORMAL
Diagnosis: UNDETERMINED — AB
High risk HPV: NEGATIVE
Neisseria Gonorrhea: NEGATIVE
Trichomonas: NEGATIVE

## 2021-03-07 ENCOUNTER — Encounter: Payer: Self-pay | Admitting: Obstetrics and Gynecology

## 2021-03-13 ENCOUNTER — Encounter: Payer: Self-pay | Admitting: Obstetrics and Gynecology

## 2021-03-13 ENCOUNTER — Telehealth: Payer: Self-pay | Admitting: Obstetrics and Gynecology

## 2021-03-13 NOTE — Telephone Encounter (Signed)
Pt aware of pos ATM genetic mutation on [facility] testing. Discussed recommendations of monthly SBE, yearly CBE and mammos and screen breast MRI. Pt to schedule mammo first (order with PCP). Can then do screening breast MRI.  Pt also at increased risk of pancreatic cancer but no FH of this. No screening recommended till age 36 at this time.  SDHA VUS discussed.   Patient understands these results apply to her, and her children qualify for testing. It is recommended that her other maternal family members have genetic testing done as well.  Hard copy mailed to pt. F/u prn. Pt made aware of phone # for [facility] prn.

## 2021-03-13 NOTE — Telephone Encounter (Signed)
PT was returning your call about her Genetic Testing you had done.

## 2021-06-18 ENCOUNTER — Other Ambulatory Visit: Payer: Self-pay

## 2021-06-18 MED ORDER — OZEMPIC (0.25 OR 0.5 MG/DOSE) 2 MG/3ML ~~LOC~~ SOPN
PEN_INJECTOR | SUBCUTANEOUS | 1 refills | Status: DC
  Filled 2021-06-18: qty 3, 28d supply, fill #0

## 2021-08-02 ENCOUNTER — Encounter: Payer: Self-pay | Admitting: Internal Medicine

## 2021-08-02 ENCOUNTER — Ambulatory Visit: Payer: BC Managed Care – PPO | Admitting: Internal Medicine

## 2021-08-02 VITALS — BP 104/70 | HR 80 | Temp 98.3°F | Resp 14 | Ht 63.0 in | Wt 137.8 lb

## 2021-08-02 DIAGNOSIS — Z8639 Personal history of other endocrine, nutritional and metabolic disease: Secondary | ICD-10-CM | POA: Insufficient documentation

## 2021-08-02 DIAGNOSIS — Z1231 Encounter for screening mammogram for malignant neoplasm of breast: Secondary | ICD-10-CM

## 2021-08-02 DIAGNOSIS — E611 Iron deficiency: Secondary | ICD-10-CM | POA: Diagnosis not present

## 2021-08-02 DIAGNOSIS — Z113 Encounter for screening for infections with a predominantly sexual mode of transmission: Secondary | ICD-10-CM

## 2021-08-02 DIAGNOSIS — E538 Deficiency of other specified B group vitamins: Secondary | ICD-10-CM

## 2021-08-02 DIAGNOSIS — Z1329 Encounter for screening for other suspected endocrine disorder: Secondary | ICD-10-CM

## 2021-08-02 DIAGNOSIS — Z13220 Encounter for screening for lipoid disorders: Secondary | ICD-10-CM

## 2021-08-02 DIAGNOSIS — E559 Vitamin D deficiency, unspecified: Secondary | ICD-10-CM

## 2021-08-02 DIAGNOSIS — E663 Overweight: Secondary | ICD-10-CM

## 2021-08-02 DIAGNOSIS — N3000 Acute cystitis without hematuria: Secondary | ICD-10-CM

## 2021-08-02 DIAGNOSIS — R7303 Prediabetes: Secondary | ICD-10-CM | POA: Diagnosis not present

## 2021-08-02 DIAGNOSIS — Z903 Acquired absence of stomach [part of]: Secondary | ICD-10-CM

## 2021-08-02 LAB — CBC WITH DIFFERENTIAL/PLATELET
Basophils Absolute: 0 10*3/uL (ref 0.0–0.1)
Basophils Relative: 0.3 % (ref 0.0–3.0)
Eosinophils Absolute: 0.1 10*3/uL (ref 0.0–0.7)
Eosinophils Relative: 2.9 % (ref 0.0–5.0)
HCT: 37.7 % (ref 36.0–46.0)
Hemoglobin: 12.4 g/dL (ref 12.0–15.0)
Lymphocytes Relative: 38 % (ref 12.0–46.0)
Lymphs Abs: 1.3 10*3/uL (ref 0.7–4.0)
MCHC: 32.8 g/dL (ref 30.0–36.0)
MCV: 92.3 fl (ref 78.0–100.0)
Monocytes Absolute: 0.3 10*3/uL (ref 0.1–1.0)
Monocytes Relative: 9 % (ref 3.0–12.0)
Neutro Abs: 1.7 10*3/uL (ref 1.4–7.7)
Neutrophils Relative %: 49.8 % (ref 43.0–77.0)
Platelets: 259 10*3/uL (ref 150.0–400.0)
RBC: 4.09 Mil/uL (ref 3.87–5.11)
RDW: 14.5 % (ref 11.5–15.5)
WBC: 3.5 10*3/uL — ABNORMAL LOW (ref 4.0–10.5)

## 2021-08-02 LAB — LIPID PANEL
Cholesterol: 127 mg/dL (ref 0–200)
HDL: 49.4 mg/dL (ref >39.00)
LDL Cholesterol: 69 mg/dL (ref 0–99)
NonHDL: 77.13
Total CHOL/HDL Ratio: 3
Triglycerides: 40 mg/dL (ref 0.0–149.0)
VLDL: 8 mg/dL (ref 0.0–40.0)

## 2021-08-02 LAB — COMPREHENSIVE METABOLIC PANEL
ALT: 12 U/L (ref 0–35)
AST: 15 U/L (ref 0–37)
Albumin: 4.3 g/dL (ref 3.5–5.2)
Alkaline Phosphatase: 51 U/L (ref 39–117)
BUN: 20 mg/dL (ref 6–23)
CO2: 27 mEq/L (ref 19–32)
Calcium: 9 mg/dL (ref 8.4–10.5)
Chloride: 107 mEq/L (ref 96–112)
Creatinine, Ser: 0.66 mg/dL (ref 0.40–1.20)
GFR: 111.81 mL/min (ref >60.00)
Glucose, Bld: 78 mg/dL (ref 70–99)
Potassium: 3.9 mEq/L (ref 3.5–5.1)
Sodium: 140 mEq/L (ref 135–145)
Total Bilirubin: 0.5 mg/dL (ref 0.2–1.2)
Total Protein: 6.6 g/dL (ref 6.0–8.3)

## 2021-08-02 LAB — VITAMIN D 25 HYDROXY (VIT D DEFICIENCY, FRACTURES): VITD: 45.75 ng/mL (ref 30.00–100.00)

## 2021-08-02 LAB — IBC + FERRITIN
Ferritin: 6.6 ng/mL — ABNORMAL LOW (ref 10.0–291.0)
Iron: 37 ug/dL — ABNORMAL LOW (ref 42–145)
Saturation Ratios: 10.4 % — ABNORMAL LOW (ref 20.0–50.0)
TIBC: 355.6 ug/dL (ref 250.0–450.0)
Transferrin: 254 mg/dL (ref 212.0–360.0)

## 2021-08-02 LAB — TSH: TSH: 1.08 u[IU]/mL (ref 0.35–5.50)

## 2021-08-02 LAB — VITAMIN B12: Vitamin B-12: 532 pg/mL (ref 211–911)

## 2021-08-02 LAB — HEMOGLOBIN A1C: Hgb A1c MFr Bld: 5.1 % (ref 4.6–6.5)

## 2021-08-02 MED ORDER — SEMAGLUTIDE (1 MG/DOSE) 4 MG/3ML ~~LOC~~ SOPN
1.0000 mg | PEN_INJECTOR | SUBCUTANEOUS | 11 refills | Status: DC
  Filled 2021-10-22: qty 3, 28d supply, fill #0
  Filled 2021-11-20: qty 3, 28d supply, fill #1
  Filled 2021-12-14: qty 3, 28d supply, fill #2
  Filled 2022-01-10: qty 3, 28d supply, fill #3
  Filled 2022-02-08 – 2022-03-12 (×2): qty 3, 28d supply, fill #4
  Filled 2022-04-09 (×2): qty 3, 28d supply, fill #5
  Filled 2022-05-13: qty 3, 28d supply, fill #6
  Filled 2022-06-04: qty 3, 28d supply, fill #7
  Filled 2022-07-02: qty 3, 28d supply, fill #8

## 2021-08-02 MED ORDER — CHOLECALCIFEROL 1.25 MG (50000 UT) PO CAPS: 50000.0000 [IU] | ORAL_CAPSULE | ORAL | 1 refills | Status: DC

## 2021-08-02 NOTE — Patient Instructions (Addendum)
Pap smear due 02/22/2022  Mammograms age 36  [facility]  Colonoscopy due age 18

## 2021-08-02 NOTE — Addendum Note (Signed)
Addended by: Leeanne Rio on: 08/02/2021 09:37 AM   Modules accepted: Orders

## 2021-08-02 NOTE — Progress Notes (Signed)
Chief Complaint  Patient presents with   Follow-up    6 mon, disc about ozempic. Was prescribed by wt loss clinic, asking if it's ok to stay on maintenance dose. Denies any pain   F/u 1. S/p gastric sleeve on ozempic 1 mg weekly per wt loss clinic  Wants refill     Review of Systems  Constitutional:  Negative for weight loss.  HENT:  Negative for hearing loss.   Eyes:  Negative for blurred vision.  Respiratory:  Negative for shortness of breath.   Cardiovascular:  Negative for chest pain.  Gastrointestinal:  Negative for abdominal pain and blood in stool.  Genitourinary:  Negative for dysuria.  Musculoskeletal:  Negative for falls and joint pain.  Skin:  Negative for rash.  Neurological:  Negative for headaches.  Psychiatric/Behavioral:  Negative for depression.    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   [facility] testing   Supervision of high risk pregnancy, antepartum, first trimester 03/28/2017   [facility] Prenatal Labs Dating 8wk [place] Blood type: O/Positive/-- (02/22 1504)  Genetic Screen  NIPS: normal XY Antibody:Negative (02/22 1504) Anatomic [place]  Rubella: 3.62 (02/22 1504) Varicella: Immune GTT  Has diabetes RPR: Non Reactive (02/22 1504)  Rhogam  HBsAg: Negative (02/22 1504)  TDaP vaccine                        Flu Shot: HIV: Non Reactive (02/22 1504)  Baby Food                       Vitamin D deficiency    Past Surgical History:  Procedure Laterality Date    BARIATRIC SURGERY  01/2014   gastric sleeve Dr. Kreg Shropshire    BUTTOCK LIFT     11/2015 Dr. Margaretha Glassing in [place]  10/28/2012   CESAREAN SECTION N/A 11/03/2017   Procedure: CESAREAN SECTION- URGENT;  Surgeon: Will Bonnet, MD;  Location: [facility] ORS;  Service: Obstetrics;  Laterality: N/A;   TUMMY TUCK  11/2015   Dr. Margaretha Glassing    Family History  Problem Relation Age of Onset   Cancer Mother        breast x 2   Breast cancer Mother 67       again at 66   Hypertension Mother    Congestive Heart Failure Father    COPD Father    Diabetes Father        TYPE 2   Heart disease Father        chf   Kidney cancer Paternal Aunt 8   Social History   Socioeconomic History   Marital status: Married    Spouse name: Not on file   Number of children: 1   Years of education: 16   Highest education level: Not on file  Occupational  History   Occupation: nurse    Comment: [employer]  Tobacco Use   Smoking status: Never   Smokeless tobacco: Never  Vaping Use   Vaping Use: Never used  Substance and Sexual Activity   Alcohol use: No    Alcohol/week: 0.0 standard drinks of alcohol   Drug use: No   Sexual activity: Yes    Birth control/protection: I.U.D.    Comment: Paragard  Other Topics Concern   Not on file  Social History Narrative   3 and 38 y.o boys as of 08/02/21    Married husband DPR [employer] [phone]   Works part time [employer] health or [employer] Strain: [place]  (11/03/2017)   Overall Financial Resource Strain (CARDIA)    Difficulty of Paying Living Expenses: Not hard at all  Food Insecurity: No Food Insecurity (11/03/2017)   Hunger Vital Sign    Worried About Running Out of Food in the Last Year: Never true    [place] in the Last Year: Never true  Transportation Needs: No Transportation Needs (11/03/2017)   PRAPARE - Hydrologist (Medical): No    Lack of  Transportation (Non-Medical): No  Physical Activity: Inactive (11/03/2017)   Exercise Vital Sign    Days of Exercise per Week: 0 days    Minutes of Exercise per Session: 0 min  Stress: No Stress Concern Present (11/03/2017)   [place]    Feeling of Stress : Not at all  Social Connections: Unknown (11/03/2017)   Social Connection and Isolation Panel [NHANES]    Frequency of Communication with Friends and Family: Three times a week    Frequency of Social Gatherings with Friends and Family: Three times a week    Attends Religious Services: Not on file    Active Member of Clubs or Organizations: Not on file    Attends Archivist Meetings: Not on file    Marital Status: Not on file  Intimate Partner Violence: Not on file   Current Meds  Medication Sig   PARAGARD INTRAUTERINE COPPER IU by Intrauterine route.   Semaglutide, 1 MG/DOSE, 4 MG/3ML SOPN Inject 1 mg as directed once a week.   [DISCONTINUED] Cholecalciferol 1.25 MG (50000 UT) capsule Take 1 capsule (50,000 Units total) by mouth once a week. d3 stop after 6 months and take 2000 to 4000 IU daily otc   [DISCONTINUED] OZEMPIC, 0.25 OR 0.5 MG/DOSE, 2 MG/1.5ML SOPN Inject 0.25 mg into the skin once a week.   [DISCONTINUED] Semaglutide,0.25 or 0.5MG/DOS, (OZEMPIC, 0.25 OR 0.5 MG/DOSE,) 2 MG/3ML SOPN Inject 0.25 mg under skin once a week   No Known Allergies No results found for this or any previous visit (from the past 2160 hour(s)). Objective  Body mass index is 24.41 kg/m. Wt Readings from Last 3 Encounters:  08/02/21 137 lb 12.8 oz (62.5 kg)  02/20/21 166 lb (75.3 kg)  01/25/21 171 lb (77.6 kg)   Temp Readings from Last 3 Encounters:  08/02/21 98.3 F (36.8 C) (Oral)  01/25/21 97.9 F (36.6 C) (Temporal)  06/20/20 97.7 F (36.5 C) (Oral)   BP Readings from Last 3 Encounters:  08/02/21 104/70  02/20/21 114/70  01/25/21 110/70   Pulse Readings  from Last 3 Encounters:  08/02/21 80  01/25/21 80  06/20/20 78    Physical Exam Vitals and nursing note  reviewed.  Constitutional:      Appearance: Normal appearance. She is well-developed and well-groomed.  HENT:     Head: Normocephalic and atraumatic.  Eyes:     Conjunctiva/sclera: Conjunctivae normal.     Pupils: Pupils are equal, round, and reactive to light.  Cardiovascular:     Rate and Rhythm: Normal rate and regular rhythm.     Heart sounds: Normal heart sounds. No murmur heard. Pulmonary:     Effort: Pulmonary effort is normal.     Breath sounds: Normal breath sounds.  Abdominal:     General: Abdomen is flat. Bowel sounds are normal.     Tenderness: There is no abdominal tenderness.  Musculoskeletal:        General: No tenderness.  Skin:    General: Skin is warm and dry.  Neurological:     General: No focal deficit present.     Mental Status: She is alert and oriented to person, place, and time. Mental status is at baseline.     Cranial Nerves: Cranial nerves 2-12 are intact.     Motor: Motor function is intact.     Coordination: Coordination is intact.     Gait: Gait is intact.  Psychiatric:        Attention and Perception: Attention and perception normal.        Mood and Affect: Mood and affect normal.        Speech: Speech normal.        Behavior: Behavior normal. Behavior is cooperative.        Thought Content: Thought content normal.        Cognition and Memory: Cognition and memory normal.        Judgment: Judgment normal.     Assessment  Plan  Prediabetes - Plan: Comprehensive metabolic panel, Lipid panel, Hemoglobin A1c, CBC with Differential/Platelet, Semaglutide, 1 MG/DOSE, 4 MG/3ML SOPN  Vitamin D deficiency - Plan: Vitamin D (25 hydroxy), Cholecalciferol 1.25 MG (50000 UT) capsule  Vitamin B12 deficiency (non anemic) - Plan: Vitamin B12  Overweight (BMI 25.0-29.9) - Plan: Semaglutide, 1 MG/DOSE, 4 MG/3ML SOPN  History of diabetes mellitus -  Plan: Hemoglobin A1c, Semaglutide, 1 MG/DOSE, 4 MG/3ML SOPN  Iron deficiency - Plan: IBC + Ferritin  Lipid screening  Thyroid disorder screening - Plan: TSH  Screening for venereal disease - Plan: Urine cytology ancillary only([place])  Acute cystitis without hematuria - Plan: Urinalysis, Routine w reflex microscopic, Urine Culture  S/P gastric sleeve procedure - Plan: Semaglutide, 1 MG/DOSE, 4 MG/3ML SOPN  Screening mammogram, encounter for - Plan: MM 3D SCREEN BREAST BILATERAL   Hm HM Flu shot utd  tdap utd   hep B immune, MMR immune Hep C neg covid 19 vaccine had 4/4 consider 5th dose    FH breast cancer in her mother in her 25s and 13s she has not had genetic testing, consider in future with ob/gyn  -referred today      [facility] has paraguard c/w bleeding with sex  -advised pt to disc with ob/gyn and call and make appt Pap 06/05/2016 neg HPV neg pap h/o abnormal pap in the past  -02/2021 ASCUS pap due in 1 year   Referred mammogram FH in Mom  Colonoscopy age 56  Declines std testing   eye will go to [facility] needs to sch  rec healthy diet and exercise  Iron daily in mvt and D3 4000 IU daily  rec healthy diet and exercise   Provider: Dr. Olivia Mackie  [facility]

## 2021-10-11 ENCOUNTER — Ambulatory Visit
Admission: RE | Admit: 2021-10-11 | Discharge: 2021-10-11 | Disposition: A | Discharge status: Home / Self Care | Payer: BC Managed Care – PPO | Source: Ambulatory Visit | Attending: Internal Medicine | Admitting: Internal Medicine

## 2021-10-11 DIAGNOSIS — Z1231 Encounter for screening mammogram for malignant neoplasm of breast: Secondary | ICD-10-CM | POA: Insufficient documentation

## 2021-10-22 ENCOUNTER — Other Ambulatory Visit: Payer: Self-pay

## 2021-11-20 ENCOUNTER — Encounter: Payer: Self-pay | Admitting: Family Medicine

## 2021-11-20 ENCOUNTER — Ambulatory Visit: Payer: BC Managed Care – PPO | Admitting: Family Medicine

## 2021-11-20 ENCOUNTER — Other Ambulatory Visit: Payer: Self-pay

## 2021-11-20 VITALS — BP 116/74 | HR 76 | Temp 98.2°F | Ht 63.0 in | Wt 130.4 lb

## 2021-11-20 DIAGNOSIS — E611 Iron deficiency: Secondary | ICD-10-CM

## 2021-11-20 DIAGNOSIS — Z8639 Personal history of other endocrine, nutritional and metabolic disease: Secondary | ICD-10-CM | POA: Diagnosis not present

## 2021-11-20 DIAGNOSIS — E119 Type 2 diabetes mellitus without complications: Secondary | ICD-10-CM | POA: Diagnosis not present

## 2021-11-20 LAB — MICROALBUMIN / CREATININE URINE RATIO
Creatinine,U: 169 mg/dL
Microalb Creat Ratio: 0.5 mg/g (ref 0.0–30.0)
Microalb, Ur: 0.9 mg/dL (ref 0.0–1.9)

## 2021-11-20 NOTE — Progress Notes (Signed)
    SUBJECTIVE:   CHIEF COMPLAINT / HPI: transfer care  Patient presents to clinic to transfer care  No acute concerns today  DM Type 2 Asymptomatic.  Tolerating medication well. Taking Ozempic 1 mg weekly.  Significant weight loss with band surgery and Ozempic.  Total weight loss ~80 plus pounds.  Anemia Feeling fatigued.  Nutrition lacking, sometimes skips meals.  Not much time with work, taking care of home and school.     PERTINENT  PMH / PSH:  DM Type 2 since 2010 IDA   OBJECTIVE:   BP 116/74 (BP Location: Left Arm, Patient Position: Sitting, Cuff Size: Normal)   Pulse 76   Temp 98.2 F (36.8 C) (Oral)   Ht '5\' 3"'$  (1.6 m)   Wt 130 lb 6.4 oz (59.1 kg)   LMP 11/04/2021   BMI 23.10 kg/m    General: Alert, no acute distress Cardio: Normal S1 and S2, RRR, no r/m/g Pulm: CTAB, normal work of breathing Abdomen: Bowel sounds normal. Abdomen soft and non-tender.  Extremities: No peripheral edema.  Neuro: Cranial nerves grossly intact   ASSESSMENT/PLAN:   Diabetes mellitus without complication (HCC) Asymptomatic.  Reviewed recent labs, A1c 5.1.  Tolerating medication well.  Do not think appropriate to increase medication given A1c well below goal and normal BMI. -Continue Ozempic 1 mg weekly, if next A1c lower, consider lowering dose -Recommend resistance training to increase muscle tone -Urine ACR today -Not on statin, goal LDL <70 -Not on ACEi/ARB, plan in future if no plan for conception. -Annual eye and foot exam previously discontinued.  Recommend to continue given history. -Follow up in 3 months  Iron deficiency Feelings of fatigue.  Recent lab results reviewed, showing low iron ferritin and iron sats.  Denies any chest pain, SOB or lower extremity edema. -Start Ferrous sulfate 325 mg every other day -Will repeat levels in 3 months    PDMP Reviewed  Carollee Leitz, MD

## 2021-11-20 NOTE — Patient Instructions (Signed)
It was a pleasure meeting you today. Thank you for allowing me to take part in your health care.  Our goals for today as we discussed include:  For your diabetes Continue Semglutatide 1 mg weekly Your last A1c in June was 5.7.  Would not increase medication for concern of lower blood glucose levels. Your BMI is 23.10.  Less than 22 is underweight.  Greater than 25 is overweight. Recommend resistance training to tone muscle and shape.   For your iron deficiency Start Ferrous Sulfate 325 mg every other day Will repeat levels in 3 months Start Folic acid 0.4 mg daily  Recommend Pneumonia 20 vaccine  Please follow-up with PCP in 3 months  If you have any questions or concerns, please do not hesitate to call the office at [phone].  I look forward to our next visit and until then take care and stay safe.  Regards,   Carollee Leitz, MD   [facility]

## 2021-11-25 ENCOUNTER — Encounter: Payer: Self-pay | Admitting: Family Medicine

## 2021-11-25 DIAGNOSIS — E119 Type 2 diabetes mellitus without complications: Secondary | ICD-10-CM | POA: Insufficient documentation

## 2021-11-25 DIAGNOSIS — E118 Type 2 diabetes mellitus with unspecified complications: Secondary | ICD-10-CM | POA: Insufficient documentation

## 2021-11-25 MED ORDER — IRON (FERROUS SULFATE) 325 (65 FE) MG PO TABS: 325.0000 mg | ORAL_TABLET | ORAL | 3 refills | Status: DC

## 2021-11-25 NOTE — Assessment & Plan Note (Addendum)
Asymptomatic.  Reviewed recent labs, A1c 5.1.  Tolerating medication well.  Do not think appropriate to increase medication given A1c well below goal and normal BMI. -Continue Ozempic 1 mg weekly, if next A1c lower, consider lowering dose -Recommend resistance training to increase muscle tone -Urine ACR today -Not on statin, goal LDL <70 -Not on ACEi/ARB, plan in future if no plan for conception. -Annual eye and foot exam previously discontinued.  Recommend to continue given history. -Follow up in 3 months

## 2021-11-25 NOTE — Assessment & Plan Note (Signed)
Feelings of fatigue.  Recent lab results reviewed, showing low iron ferritin and iron sats.  Denies any chest pain, SOB or lower extremity edema. -Start Ferrous sulfate 325 mg every other day -Will repeat levels in 3 months

## 2021-12-14 ENCOUNTER — Other Ambulatory Visit: Payer: Self-pay

## 2022-01-10 ENCOUNTER — Other Ambulatory Visit: Payer: Self-pay

## 2022-01-31 IMAGING — US US ABDOMEN COMPLETE
1 series · 15 of 25 positions shown · non-contrast
Comparison: None.

CLINICAL DATA: Right upper quadrant pain

EXAM:
ABDOMEN ULTRASOUND COMPLETE

[Series 1: us abdomen complete · 15 of 115 slices shown]
[im 1/115]
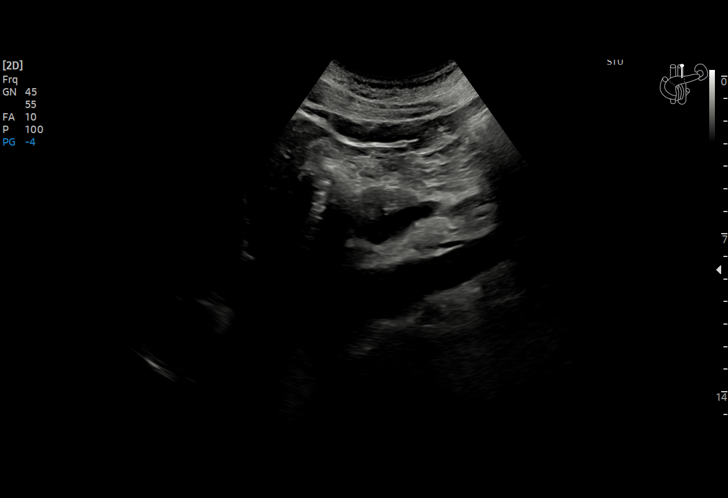
[im 10/115]
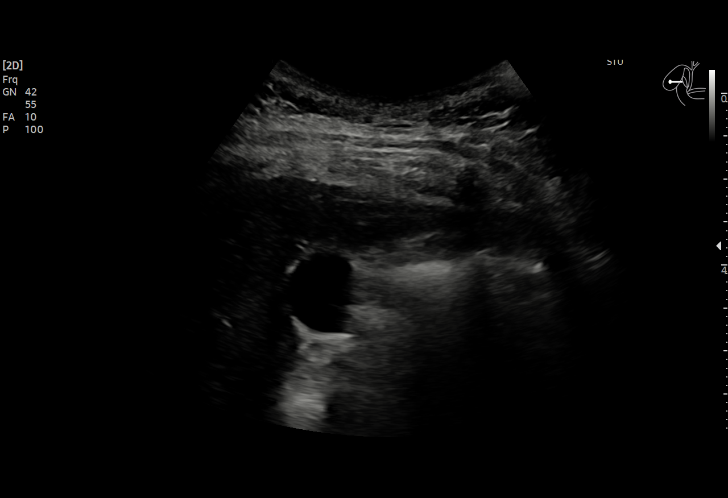
[im 20/115]
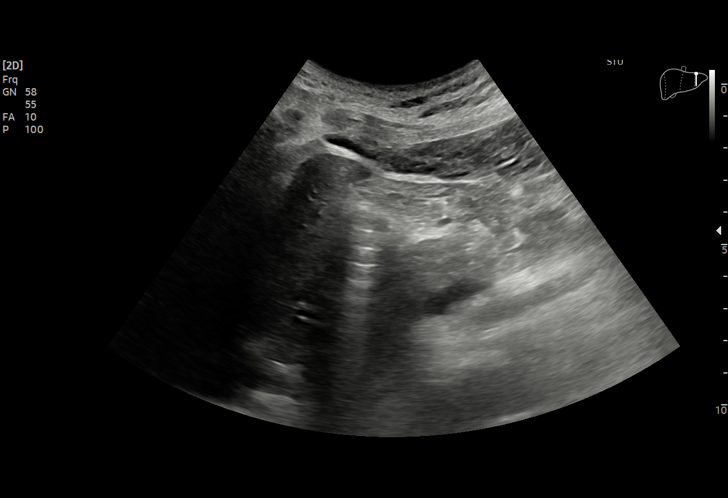
[im 24/115]
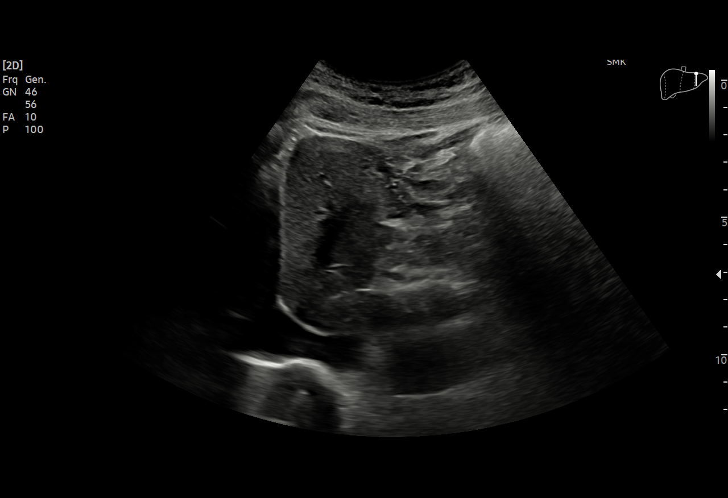
[im 34/115]
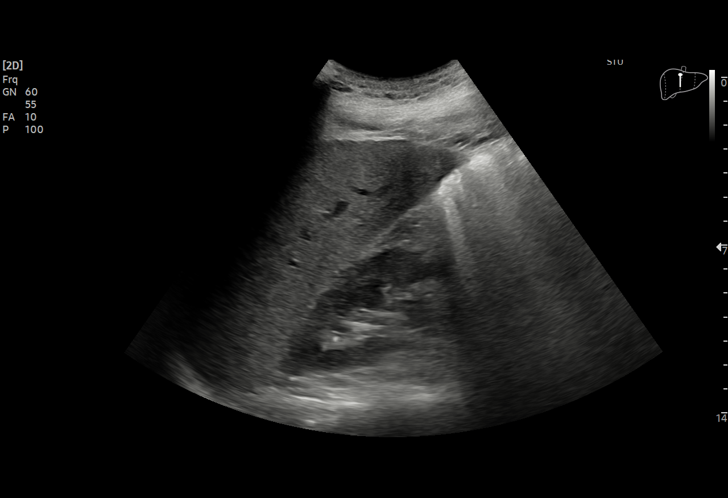
[im 43/115]
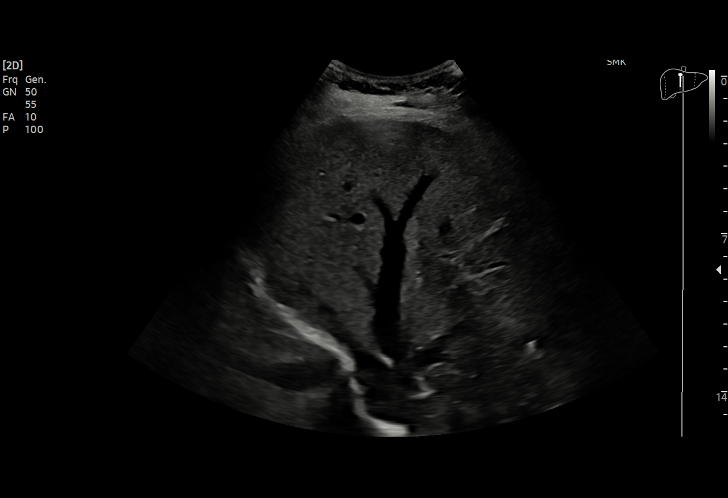
[im 48/115]
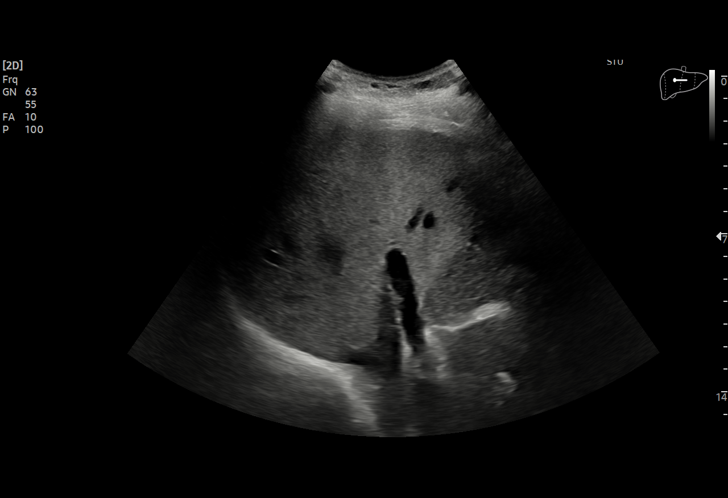
[im 58/115]
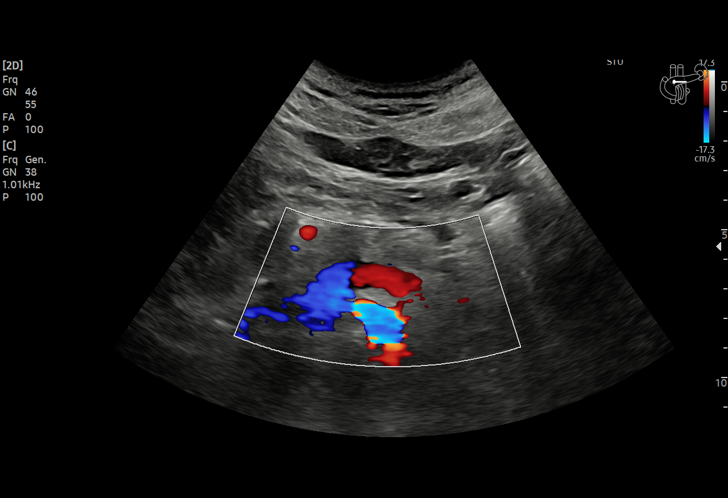
[im 67/115]
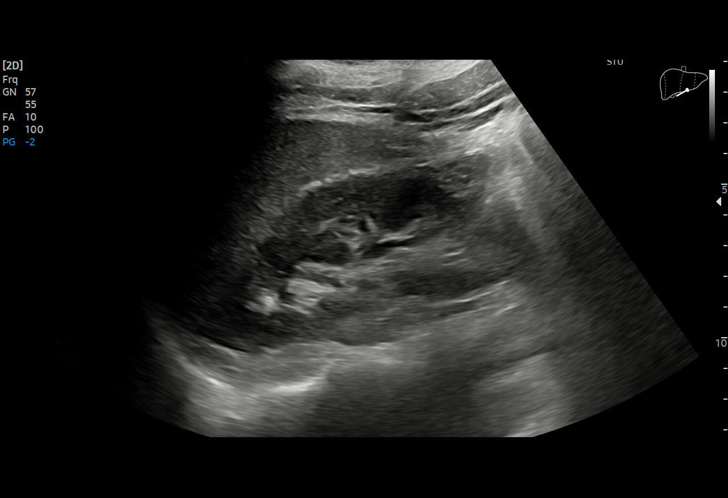
[im 72/115]
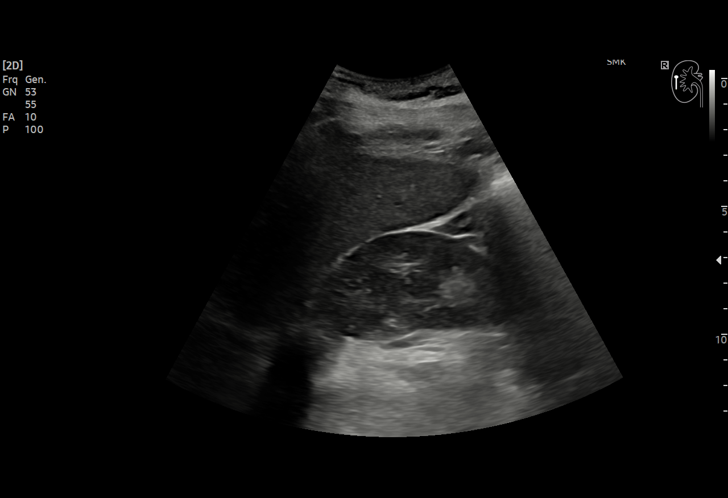
[im 81/115]
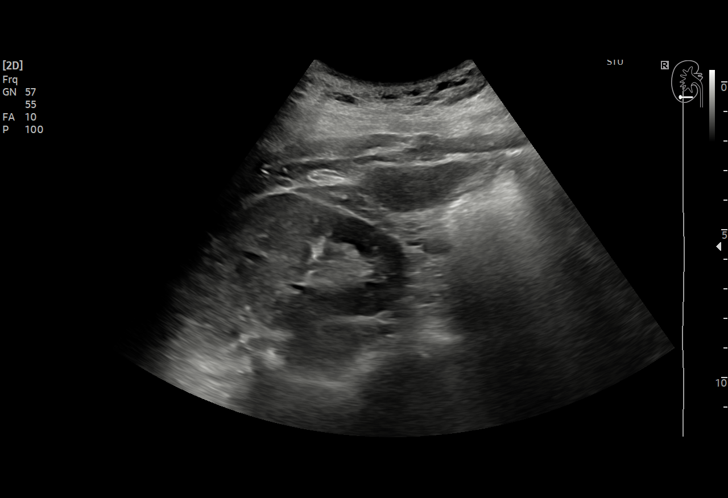
[im 91/115]
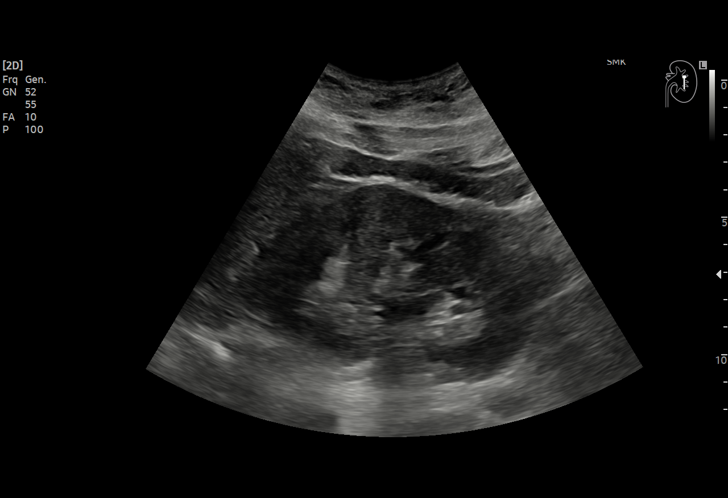
[im 96/115]
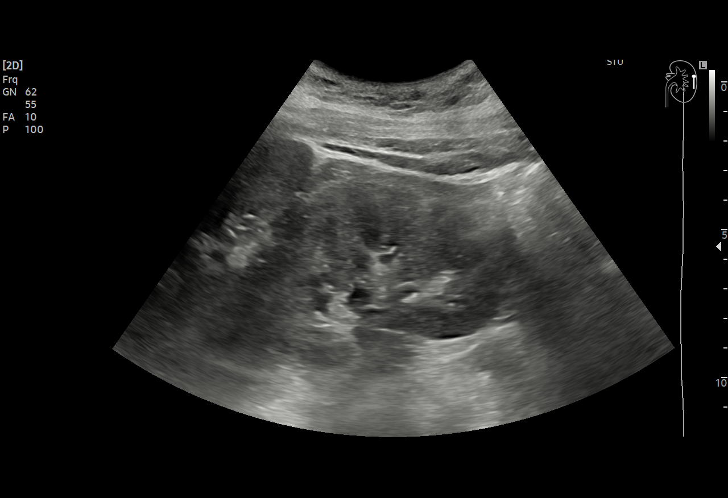
[im 105/115]
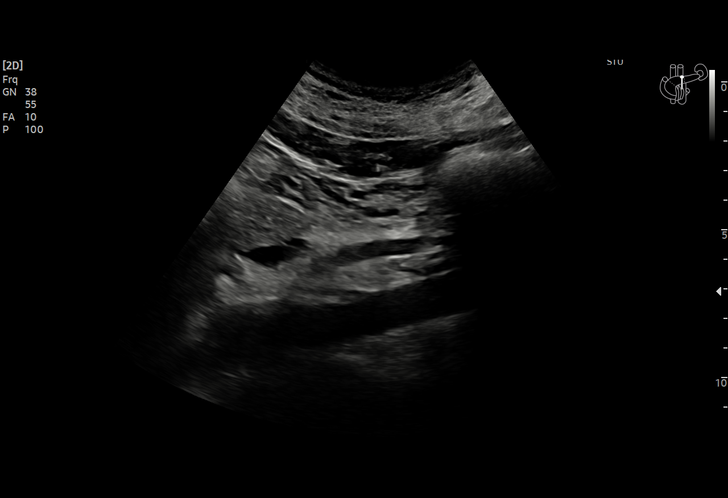
[im 115/115]
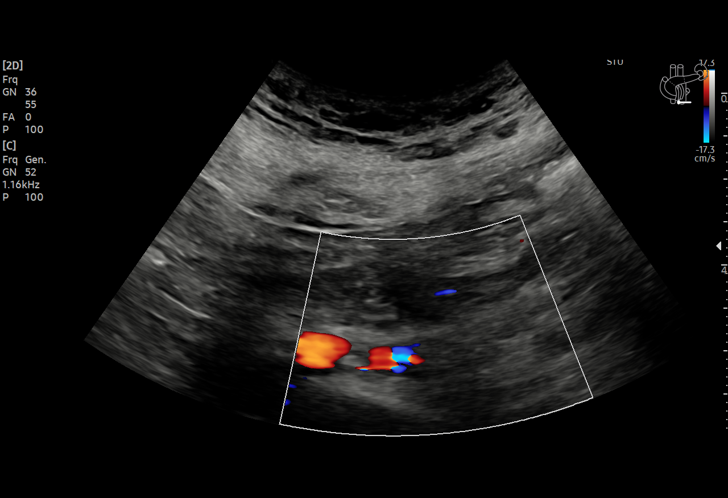

[15 of 25 positions shown; findings below may reference images not displayed]

FINDINGS: Gallbladder: No gallstones or wall thickening visualized (1.0 mm).
No sonographic Murphy sign noted by sonographer.

Common bile duct: Diameter: 3.5 mm

Liver: No focal lesion identified. Within normal limits in
parenchymal echogenicity. Portal vein is patent on color Doppler
imaging with normal direction of blood flow towards the liver.

IVC: No abnormality visualized.

Pancreas: Visualized portion unremarkable.

Spleen: Size (7.8 cm) and appearance within normal limits.

Right Kidney: Length: 11.5 cm. Echogenicity within normal limits. No
mass or hydronephrosis visualized.

Left Kidney: Length: 10.5 cm. Echogenicity within normal limits. No
mass or hydronephrosis visualized.

Abdominal aorta: No aneurysm visualized (2.6 cm in AP diameter).

Other findings: None.
IMPRESSION: Normal abdominal ultrasound.

## 2022-02-08 ENCOUNTER — Other Ambulatory Visit: Payer: Self-pay

## 2022-02-15 ENCOUNTER — Other Ambulatory Visit: Payer: Self-pay

## 2022-02-20 ENCOUNTER — Encounter: Payer: Self-pay | Admitting: Family Medicine

## 2022-02-21 ENCOUNTER — Ambulatory Visit: Payer: BC Managed Care – PPO | Admitting: Family Medicine

## 2022-03-04 ENCOUNTER — Encounter: Payer: Self-pay | Admitting: Family Medicine

## 2022-03-04 ENCOUNTER — Other Ambulatory Visit (HOSPITAL_COMMUNITY)
Admission: RE | Admit: 2022-03-04 | Discharge: 2022-03-04 | Disposition: A | Discharge status: Home / Self Care | Payer: BC Managed Care – PPO | Source: Ambulatory Visit | Attending: Family Medicine | Admitting: Family Medicine

## 2022-03-04 ENCOUNTER — Ambulatory Visit: Payer: BC Managed Care – PPO | Admitting: Family Medicine

## 2022-03-04 VITALS — BP 110/78 | HR 76 | Temp 98.4°F | Resp 17 | Ht 63.0 in | Wt 127.5 lb

## 2022-03-04 DIAGNOSIS — Z113 Encounter for screening for infections with a predominantly sexual mode of transmission: Secondary | ICD-10-CM | POA: Insufficient documentation

## 2022-03-04 DIAGNOSIS — E559 Vitamin D deficiency, unspecified: Secondary | ICD-10-CM | POA: Diagnosis not present

## 2022-03-04 DIAGNOSIS — E611 Iron deficiency: Secondary | ICD-10-CM | POA: Diagnosis not present

## 2022-03-04 DIAGNOSIS — E119 Type 2 diabetes mellitus without complications: Secondary | ICD-10-CM | POA: Diagnosis not present

## 2022-03-04 DIAGNOSIS — E538 Deficiency of other specified B group vitamins: Secondary | ICD-10-CM

## 2022-03-04 DIAGNOSIS — E663 Overweight: Secondary | ICD-10-CM

## 2022-03-04 LAB — POCT URINE PREGNANCY: Preg Test, Ur: NEGATIVE

## 2022-03-04 NOTE — Progress Notes (Unsigned)
   SUBJECTIVE:   Chief Complaint  Patient presents with   Medical Management of Chronic Issues    7 mth f/u of DM & Anemia   HPI Presents for follow up for Diabetes and Anemia. Seen in clinic on 10/17.   Anemia Started on Ferrous Sulfate every other day for Ferritin <6.  Reports had not started medication and since decreasing Ozempic fatigue has improved  Denies any chest pain, palpitations, SOB, lower extremity edema or constipation.    DM Type 2 Asymptomatic. Had decreased Ozempic to 0.5 mg weekly in mid October 2023 as she felt was losing too much weight.  Would like to continue this dose to maintain current weight.  STI check New sexual partner.  Last sexual encounter 1 week ago without condom use.  Reports vaginal discharge, foul odor.  LMP 01/03. Partner was someone had been with previously and she reports had previously contracted STI from.  She does not know if partner has any current symptoms.    PERTINENT PMH / PSH: DM Type 2   OBJECTIVE:  BP 110/78   Pulse 76   Temp 98.4 F (36.9 C) (Oral)   Resp 17   Ht '5\' 3"'$  (1.6 m)   Wt 127 lb 8 oz (57.8 kg)   SpO2 96%   BMI 22.59 kg/m    Physical Exam Vitals reviewed.  Constitutional:      General: She is not in acute distress.    Appearance: She is normal weight. She is not ill-appearing.  HENT:     Head: Normocephalic.  Eyes:     Conjunctiva/sclera: Conjunctivae normal.  Cardiovascular:     Rate and Rhythm: Normal rate and regular rhythm.     Pulses: Normal pulses.     Heart sounds: Normal heart sounds.  Pulmonary:     Effort: Pulmonary effort is normal.     Breath sounds: Normal breath sounds.  Abdominal:     General: Bowel sounds are normal. There is no distension.     Tenderness: There is no abdominal tenderness. There is no right CVA tenderness or left CVA tenderness.  Neurological:     Mental Status: She is alert. Mental status is at baseline.  Psychiatric:        Mood and Affect: Mood normal.         Behavior: Behavior normal.        Thought Content: Thought content normal.        Judgment: Judgment normal.     ASSESSMENT/PLAN:  Diabetes mellitus without complication (HCC)  Iron deficiency  Vitamin D deficiency  Overweight (BMI 25.0-29.9)  Vitamin B12 deficiency (non anemic)   PDMP reviewed***  No follow-ups on file.  Carollee Leitz, MD

## 2022-03-04 NOTE — Patient Instructions (Addendum)
It was a pleasure meeting you today. Thank you for allowing me to take part in your health care.  Our goals for today as we discussed include:  Continue Ozempic 0.05 mg weekly Blood work today  Pregnancy test today Will MyChart you the results of urine today.  If needing treatment will send in prescription to your pharmacy.    If you have any questions or concerns, please do not hesitate to call the office at [phone].  I look forward to our next visit and until then take care and stay safe.  Regards,   Carollee Leitz, MD   [facility]

## 2022-03-05 ENCOUNTER — Other Ambulatory Visit: Payer: Self-pay | Admitting: Family Medicine

## 2022-03-05 ENCOUNTER — Encounter: Payer: Self-pay | Admitting: Family Medicine

## 2022-03-05 DIAGNOSIS — B9689 Other specified bacterial agents as the cause of diseases classified elsewhere: Secondary | ICD-10-CM

## 2022-03-05 DIAGNOSIS — Z113 Encounter for screening for infections with a predominantly sexual mode of transmission: Secondary | ICD-10-CM | POA: Insufficient documentation

## 2022-03-05 DIAGNOSIS — E559 Vitamin D deficiency, unspecified: Secondary | ICD-10-CM

## 2022-03-05 LAB — CBC WITH DIFFERENTIAL/PLATELET
Basophils Absolute: 0 10*3/uL (ref 0.0–0.1)
Basophils Relative: 0.6 % (ref 0.0–3.0)
Eosinophils Absolute: 0.1 10*3/uL (ref 0.0–0.7)
Eosinophils Relative: 1.6 % (ref 0.0–5.0)
HCT: 39.6 % (ref 36.0–46.0)
Hemoglobin: 13.4 g/dL (ref 12.0–15.0)
Lymphocytes Relative: 28.8 % (ref 12.0–46.0)
Lymphs Abs: 1.5 10*3/uL (ref 0.7–4.0)
MCHC: 33.8 g/dL (ref 30.0–36.0)
MCV: 90.3 fl (ref 78.0–100.0)
Monocytes Absolute: 0.5 10*3/uL (ref 0.1–1.0)
Monocytes Relative: 8.8 % (ref 3.0–12.0)
Neutro Abs: 3.2 10*3/uL (ref 1.4–7.7)
Neutrophils Relative %: 60.2 % (ref 43.0–77.0)
Platelets: 290 10*3/uL (ref 150.0–400.0)
RBC: 4.39 Mil/uL (ref 3.87–5.11)
RDW: 13.7 % (ref 11.5–15.5)
WBC: 5.3 10*3/uL (ref 4.0–10.5)

## 2022-03-05 LAB — URINE CYTOLOGY ANCILLARY ONLY
Bacterial Vaginitis-Urine: POSITIVE — AB
Candida Urine: NEGATIVE
Chlamydia: NEGATIVE
Comment: NEGATIVE
Comment: NEGATIVE
Comment: NORMAL
Neisseria Gonorrhea: NEGATIVE
Trichomonas: NEGATIVE

## 2022-03-05 LAB — HEMOGLOBIN A1C: Hgb A1c MFr Bld: 5.1 % (ref 4.6–6.5)

## 2022-03-05 LAB — IBC + FERRITIN
Ferritin: 6.5 ng/mL — ABNORMAL LOW (ref 10.0–291.0)
Iron: 159 ug/dL — ABNORMAL HIGH (ref 42–145)
Saturation Ratios: 39.3 % (ref 20.0–50.0)
TIBC: 404.6 ug/dL (ref 250.0–450.0)
Transferrin: 289 mg/dL (ref 212.0–360.0)

## 2022-03-05 LAB — COMPREHENSIVE METABOLIC PANEL
ALT: 19 U/L (ref 0–35)
AST: 18 U/L (ref 0–37)
Albumin: 4.3 g/dL (ref 3.5–5.2)
Alkaline Phosphatase: 58 U/L (ref 39–117)
BUN: 14 mg/dL (ref 6–23)
CO2: 26 mEq/L (ref 19–32)
Calcium: 9.3 mg/dL (ref 8.4–10.5)
Chloride: 103 mEq/L (ref 96–112)
Creatinine, Ser: 0.78 mg/dL (ref 0.40–1.20)
GFR: 96.41 mL/min (ref >60.00)
Glucose, Bld: 80 mg/dL (ref 70–99)
Potassium: 4.1 mEq/L (ref 3.5–5.1)
Sodium: 136 mEq/L (ref 135–145)
Total Bilirubin: 0.8 mg/dL (ref 0.2–1.2)
Total Protein: 7.1 g/dL (ref 6.0–8.3)

## 2022-03-05 LAB — VITAMIN D 25 HYDROXY (VIT D DEFICIENCY, FRACTURES): VITD: 26 ng/mL — ABNORMAL LOW (ref 30.00–100.00)

## 2022-03-05 MED ORDER — METRONIDAZOLE 500 MG PO TABS: 500.0000 mg | ORAL_TABLET | Freq: Two times a day (BID) | ORAL | 0 refills | Status: AC

## 2022-03-05 MED ORDER — VITAMIN D (ERGOCALCIFEROL) 1.25 MG (50000 UNIT) PO CAPS: 50000.0000 [IU] | ORAL_CAPSULE | ORAL | 0 refills | Status: DC

## 2022-03-05 NOTE — Assessment & Plan Note (Signed)
Chronic.  Stable.  Asymptomatic.  Self decreased Ozempic 0.5 mg weekly.  Tolerating well.  -Continue Ozempic 0.5 mg weekly -Repeat A1c -Recommend resistance training to increase muscle tone -Not on statin, at goal LDL <70 -Not on ACEi/ARB, plan in future if no plan for conception. -Annual eye and foot exam previously discontinued.  Recommend to continue given history. -Follow up in 6 months

## 2022-03-05 NOTE — Assessment & Plan Note (Signed)
Feelings of fatigue have improved.  Not taking ferrous sulfate as previously prescribed.  Will repeat ferritin today If remains low recommend initiation of ferrous sulfate 325 mg every other day

## 2022-03-05 NOTE — Assessment & Plan Note (Signed)
Check vitamin D levels 

## 2022-03-05 NOTE — Assessment & Plan Note (Signed)
Recent unprotected sex.  Complaint of foul-smelling vaginal discharge.  Abdominal exam benign. Declined HIV/RPR testing. Declined pelvic exam Urine sent for STI testing If positive will treat

## 2022-03-12 ENCOUNTER — Other Ambulatory Visit: Payer: Self-pay

## 2022-03-14 ENCOUNTER — Other Ambulatory Visit: Payer: Self-pay

## 2022-04-09 ENCOUNTER — Other Ambulatory Visit: Payer: Self-pay

## 2022-04-10 ENCOUNTER — Other Ambulatory Visit: Payer: Self-pay

## 2022-04-10 ENCOUNTER — Encounter: Payer: Self-pay | Admitting: Family Medicine

## 2022-04-12 ENCOUNTER — Other Ambulatory Visit: Payer: Self-pay

## 2022-06-04 ENCOUNTER — Other Ambulatory Visit: Payer: Self-pay

## 2022-06-12 ENCOUNTER — Other Ambulatory Visit: Payer: Self-pay

## 2022-06-17 ENCOUNTER — Other Ambulatory Visit: Payer: Self-pay

## 2022-07-02 ENCOUNTER — Other Ambulatory Visit: Payer: Self-pay

## 2022-07-29 ENCOUNTER — Other Ambulatory Visit: Payer: Self-pay

## 2022-07-30 ENCOUNTER — Other Ambulatory Visit: Payer: Self-pay

## 2022-08-01 ENCOUNTER — Other Ambulatory Visit: Payer: Self-pay

## 2022-08-01 ENCOUNTER — Other Ambulatory Visit: Payer: Self-pay | Admitting: Family Medicine

## 2022-08-01 DIAGNOSIS — R7303 Prediabetes: Secondary | ICD-10-CM

## 2022-08-01 DIAGNOSIS — E663 Overweight: Secondary | ICD-10-CM

## 2022-08-01 DIAGNOSIS — Z8639 Personal history of other endocrine, nutritional and metabolic disease: Secondary | ICD-10-CM

## 2022-08-01 DIAGNOSIS — Z903 Acquired absence of stomach [part of]: Secondary | ICD-10-CM

## 2022-08-01 NOTE — Telephone Encounter (Signed)
Refilled: 08/02/2021  last filled by Dr. French Ana Last OV: 03/04/2022 Next OV: 09/02/2022

## 2022-08-02 ENCOUNTER — Telehealth: Payer: Self-pay | Admitting: Family Medicine

## 2022-08-02 ENCOUNTER — Other Ambulatory Visit: Payer: Self-pay

## 2022-08-02 ENCOUNTER — Other Ambulatory Visit: Payer: Self-pay | Admitting: Family Medicine

## 2022-08-02 DIAGNOSIS — E663 Overweight: Secondary | ICD-10-CM

## 2022-08-02 DIAGNOSIS — Z8639 Personal history of other endocrine, nutritional and metabolic disease: Secondary | ICD-10-CM

## 2022-08-02 DIAGNOSIS — R7303 Prediabetes: Secondary | ICD-10-CM

## 2022-08-02 DIAGNOSIS — Z903 Acquired absence of stomach [part of]: Secondary | ICD-10-CM

## 2022-08-02 MED ORDER — SEMAGLUTIDE (1 MG/DOSE) 4 MG/3ML ~~LOC~~ SOPN: 0.5000 mg | PEN_INJECTOR | SUBCUTANEOUS | 6 refills | Status: DC

## 2022-08-02 NOTE — Telephone Encounter (Signed)
Pt need a refill on ozempic sent to [facility]

## 2022-08-05 ENCOUNTER — Other Ambulatory Visit: Payer: Self-pay | Admitting: Radiology

## 2022-08-05 ENCOUNTER — Other Ambulatory Visit: Payer: Self-pay

## 2022-08-05 DIAGNOSIS — Z8639 Personal history of other endocrine, nutritional and metabolic disease: Secondary | ICD-10-CM

## 2022-08-05 DIAGNOSIS — Z903 Acquired absence of stomach [part of]: Secondary | ICD-10-CM

## 2022-08-05 DIAGNOSIS — E663 Overweight: Secondary | ICD-10-CM

## 2022-08-05 DIAGNOSIS — R7303 Prediabetes: Secondary | ICD-10-CM

## 2022-08-05 MED ORDER — SEMAGLUTIDE (1 MG/DOSE) 4 MG/3ML ~~LOC~~ SOPN
0.5000 mg | PEN_INJECTOR | SUBCUTANEOUS | 6 refills | Status: DC
Start: 2022-08-05 — End: 2022-09-03
  Filled 2022-08-05: qty 3, 28d supply, fill #0
  Filled 2022-08-07 – 2022-09-03 (×2): qty 3, 56d supply, fill #0

## 2022-08-07 ENCOUNTER — Other Ambulatory Visit: Payer: Self-pay

## 2022-08-07 ENCOUNTER — Telehealth: Payer: Self-pay

## 2022-08-07 NOTE — Telephone Encounter (Signed)
*  Primary  PA request received for Ozempic (1 MG/DOSE) 4MG /3ML pen-injectors  PA has been submitted to [employer] via CMM and is pending additional questions/determination  Key: [id number]

## 2022-08-08 NOTE — Telephone Encounter (Signed)
Pharmacy Patient Advocate Encounter  Received notification from [facility] [employer] that the request for prior authorization for Ozepmic has been denied due to Your plan only covers this drug when A) your A1C is sent to [place], and your test results are in a certain range (A1C greater than or equal to 6.5 percent), B) your 2-hour plasma glucose (PG) during oral glucose tolerance test (OGTT) is sent to [place], and your results are in a certain range (2-hour PG greater than or equal to 200 mg/dL), C) your random plasma glucose is sent to [place], and your test results are in a certain range (random plasma glucose greater than or equal to 200 mg/dL with symptoms of hyperglycemia (e.g., polyuria, polydipsia, polyphagia) or hyperglycemic crisis), or D) your fasting plasma glucose (FPG) is sent to [place], and your results are in a certain range (FPG greater than or equal to 126 mg/dL). We denied your request because: A) We did not receive your results, or B) Your results were not in the approvable range.    Please be advised we currently do not have a Pharmacist to review denials, therefore you will need to process appeals accordingly as needed. Thanks for your support at this time.   You may fax [phone], to appeal.   Denial letter indexed to chart

## 2022-08-09 NOTE — Telephone Encounter (Signed)
Ozempic is not covered by pt's insurance.

## 2022-08-09 NOTE — Telephone Encounter (Signed)
Patient called about her Ozempic. Patient called pharmacy and the dosage is incorrect. Please correct and resend to pharmacy.

## 2022-08-13 ENCOUNTER — Encounter: Payer: Self-pay | Admitting: *Deleted

## 2022-08-13 NOTE — Telephone Encounter (Signed)
Sent mychart message & faxing over last office note & labs to appeals

## 2022-08-13 NOTE — Telephone Encounter (Signed)
Pt called stating her lab work and BMI was not sent it to her insurance company that is why they denied her claim

## 2022-09-02 ENCOUNTER — Ambulatory Visit: Payer: BC Managed Care – PPO | Admitting: Family Medicine

## 2022-09-02 ENCOUNTER — Encounter: Payer: Self-pay | Admitting: Family Medicine

## 2022-09-02 VITALS — BP 106/70 | HR 71 | Temp 98.0°F | Resp 16 | Ht 63.0 in | Wt 129.4 lb

## 2022-09-02 DIAGNOSIS — E559 Vitamin D deficiency, unspecified: Secondary | ICD-10-CM

## 2022-09-02 DIAGNOSIS — Z7984 Long term (current) use of oral hypoglycemic drugs: Secondary | ICD-10-CM

## 2022-09-02 DIAGNOSIS — E118 Type 2 diabetes mellitus with unspecified complications: Secondary | ICD-10-CM

## 2022-09-02 DIAGNOSIS — E119 Type 2 diabetes mellitus without complications: Secondary | ICD-10-CM

## 2022-09-02 LAB — POCT GLYCOSYLATED HEMOGLOBIN (HGB A1C): Hemoglobin A1C: 4.9 % (ref 4.0–5.6)

## 2022-09-02 MED ORDER — VITAMIN D (ERGOCALCIFEROL) 1.25 MG (50000 UNIT) PO CAPS: 50000.0000 [IU] | ORAL_CAPSULE | ORAL | 1 refills | Status: DC

## 2022-09-02 NOTE — Patient Instructions (Addendum)
It was a pleasure meeting you today. Thank you for allowing me to take part in your health care.  Our goals for today as we discussed include:  Call insurance to check if Rybelsus is covered. It is the pill form of Ozempic but taken daily  Refill sent for Vitamin D 1.25 mg weekly  Recommend Fusion Plus for Bariatric patients. For low iron.  Follow up in 6 months  If you have any questions or concerns, please do not hesitate to call the office at [phone].  I look forward to our next visit and until then take care and stay safe.  Regards,   Dana Allan, MD   [facility]

## 2022-09-02 NOTE — Progress Notes (Signed)
   SUBJECTIVE:   Chief Complaint  Patient presents with   Prediabetes   HPI Presents for follow up for chronic disease management  DM Type 2 Asymptomatic.  Taking Ozempic 0.5 mg weekly.  Recently denied by insurance to continue medication.  Prior approval was appealed and denied.  Has gained a few pounds since unable to obtain medication.   PERTINENT PMH / PSH: DM Type 2 Hypertension History of gastric bypass surgery Hyperlipidemia   OBJECTIVE:  BP 106/70   Pulse 71   Temp 98 F (36.7 C)   Resp 16   Ht 5\' 3"  (1.6 m)   Wt 129 lb 6 oz (58.7 kg)   SpO2 99%   BMI 22.92 kg/m    Physical Exam Vitals reviewed.  Constitutional:      General: She is not in acute distress.    Appearance: She is normal weight. She is not ill-appearing.  HENT:     Head: Normocephalic.  Eyes:     Conjunctiva/sclera: Conjunctivae normal.  Cardiovascular:     Rate and Rhythm: Normal rate and regular rhythm.     Pulses: Normal pulses.     Heart sounds: Normal heart sounds.  Pulmonary:     Effort: Pulmonary effort is normal.     Breath sounds: Normal breath sounds.  Abdominal:     General: Bowel sounds are normal. There is no distension.     Tenderness: There is no abdominal tenderness. There is no right CVA tenderness or left CVA tenderness.  Neurological:     Mental Status: She is alert. Mental status is at baseline.  Psychiatric:        Mood and Affect: Mood normal.        Behavior: Behavior normal.        Thought Content: Thought content normal.        Judgment: Judgment normal.     ASSESSMENT/PLAN:  Controlled type 2 diabetes mellitus with complication, without long-term current use of insulin (HCC) Assessment & Plan: Chronic.  Stable.  Asymptomatic.  Self decreased Ozempic 0.5 mg weekly.  Tolerating well.  -Continue Ozempic 0.5 mg weekly -Repeat A1c today 4.9 -Recommend resistance training to increase muscle tone -Not on statin, at goal LDL <70 -Not on ACEi/ARB, BP well  controlled -Annual eye and foot exam previously discontinued.    Orders: -     POCT glycosylated hemoglobin (Hb A1C)  Vitamin D deficiency -     Vitamin D (Ergocalciferol); Take 1 capsule (50,000 Units total) by mouth every 7 (seven) days.  Dispense: 12 capsule; Refill: 1   PDMP reviewed  Return in about 6 months (around 03/05/2023), or if symptoms worsen or fail to improve, for PCP.  Dana Allan, MD   Addendum  07/30 Called and spoke with the pharmacist and PA had been put in incorrectly.  Diagnosis had been prediabetes however patient is a diabetic and needs to be on this medication.  Prescription was resent with correct diagnosis and patient should be able to pick up once approved.

## 2022-09-03 ENCOUNTER — Telehealth: Payer: Self-pay

## 2022-09-03 ENCOUNTER — Other Ambulatory Visit (HOSPITAL_COMMUNITY): Payer: Self-pay

## 2022-09-03 ENCOUNTER — Other Ambulatory Visit: Payer: Self-pay | Admitting: Family Medicine

## 2022-09-03 ENCOUNTER — Other Ambulatory Visit: Payer: Self-pay

## 2022-09-03 DIAGNOSIS — E118 Type 2 diabetes mellitus with unspecified complications: Secondary | ICD-10-CM

## 2022-09-03 MED ORDER — SEMAGLUTIDE (1 MG/DOSE) 4 MG/3ML ~~LOC~~ SOPN
0.5000 mg | PEN_INJECTOR | SUBCUTANEOUS | 6 refills | Status: DC
Start: 2022-09-03 — End: 2022-10-29
  Filled 2022-09-03: qty 3, 28d supply, fill #0
  Filled 2022-09-30: qty 3, 28d supply, fill #1
  Filled 2022-10-27: qty 3, 28d supply, fill #2

## 2022-09-03 NOTE — Telephone Encounter (Signed)
Pt called stating she would like to try the zepbound since she can not get the ozempic

## 2022-09-03 NOTE — Telephone Encounter (Signed)
Pharmacy Patient Advocate Encounter   Received notification from Patient Advice Request messages that prior authorization for Zepbound 2.5MG /0.5ML pen-injectors is required/requested.   Insurance verification completed.   The patient is insured through [facility] .   Per test claim: The current 28 day co-pay is, $1,017.57.  No PA needed at this time.

## 2022-09-04 NOTE — Telephone Encounter (Signed)
Called and informed pt of this information. She said that she had a coupon that she will use for the zepbound.

## 2022-09-05 ENCOUNTER — Other Ambulatory Visit: Payer: Self-pay

## 2022-09-15 ENCOUNTER — Encounter: Payer: Self-pay | Admitting: Family Medicine

## 2022-09-15 NOTE — Assessment & Plan Note (Addendum)
Chronic.  Stable.  Asymptomatic.  Self decreased Ozempic 0.5 mg weekly.  Tolerating well.  -Continue Ozempic 0.5 mg weekly -Repeat A1c today 4.9 -Recommend resistance training to increase muscle tone -Not on statin, at goal LDL <70 -Not on ACEi/ARB, BP well controlled -Annual eye and foot exam previously discontinued.

## 2022-10-01 ENCOUNTER — Other Ambulatory Visit: Payer: Self-pay

## 2022-10-28 ENCOUNTER — Other Ambulatory Visit: Payer: Self-pay

## 2022-10-28 ENCOUNTER — Other Ambulatory Visit: Payer: Self-pay | Admitting: Family Medicine

## 2022-10-28 DIAGNOSIS — E118 Type 2 diabetes mellitus with unspecified complications: Secondary | ICD-10-CM

## 2022-10-29 ENCOUNTER — Other Ambulatory Visit: Payer: Self-pay | Admitting: Family Medicine

## 2022-10-29 ENCOUNTER — Other Ambulatory Visit: Payer: Self-pay

## 2022-10-29 DIAGNOSIS — E118 Type 2 diabetes mellitus with unspecified complications: Secondary | ICD-10-CM

## 2022-10-30 ENCOUNTER — Other Ambulatory Visit: Payer: Self-pay

## 2022-10-30 MED ORDER — SEMAGLUTIDE(0.25 OR 0.5MG/DOS) 2 MG/3ML ~~LOC~~ SOPN
0.5000 mg | PEN_INJECTOR | SUBCUTANEOUS | 6 refills | Status: DC
Start: 2022-10-30 — End: 2023-06-26
  Filled 2022-10-30 – 2022-12-03 (×2): qty 3, 28d supply, fill #0
  Filled 2022-12-24: qty 3, 28d supply, fill #1
  Filled 2023-01-13: qty 3, 28d supply, fill #2
  Filled 2023-02-03: qty 3, 28d supply, fill #3
  Filled 2023-03-03: qty 3, 28d supply, fill #4
  Filled 2023-05-19: qty 3, 28d supply, fill #5
  Filled 2023-06-16: qty 3, 28d supply, fill #6

## 2022-12-03 ENCOUNTER — Other Ambulatory Visit: Payer: Self-pay

## 2022-12-24 ENCOUNTER — Other Ambulatory Visit: Payer: Self-pay

## 2023-01-13 ENCOUNTER — Other Ambulatory Visit: Payer: Self-pay

## 2023-01-15 ENCOUNTER — Other Ambulatory Visit: Payer: Self-pay

## 2023-02-03 ENCOUNTER — Other Ambulatory Visit: Payer: Self-pay

## 2023-02-04 ENCOUNTER — Other Ambulatory Visit: Payer: Self-pay

## 2023-02-12 ENCOUNTER — Ambulatory Visit: Payer: BC Managed Care – PPO | Admitting: Internal Medicine

## 2023-02-12 ENCOUNTER — Other Ambulatory Visit (HOSPITAL_COMMUNITY)
Admission: RE | Admit: 2023-02-12 | Discharge: 2023-02-12 | Disposition: A | Discharge status: Home / Self Care | Payer: BC Managed Care – PPO | Source: Ambulatory Visit | Attending: Internal Medicine | Admitting: Internal Medicine

## 2023-02-12 ENCOUNTER — Ambulatory Visit: Payer: Self-pay | Admitting: Family Medicine

## 2023-02-12 VITALS — BP 116/68 | HR 79 | Temp 98.4°F | Ht 63.0 in | Wt 133.4 lb

## 2023-02-12 DIAGNOSIS — Z113 Encounter for screening for infections with a predominantly sexual mode of transmission: Secondary | ICD-10-CM | POA: Diagnosis present

## 2023-02-12 DIAGNOSIS — N3001 Acute cystitis with hematuria: Secondary | ICD-10-CM | POA: Insufficient documentation

## 2023-02-12 DIAGNOSIS — N898 Other specified noninflammatory disorders of vagina: Secondary | ICD-10-CM | POA: Insufficient documentation

## 2023-02-12 DIAGNOSIS — N390 Urinary tract infection, site not specified: Secondary | ICD-10-CM | POA: Insufficient documentation

## 2023-02-12 LAB — POC URINALSYSI DIPSTICK (AUTOMATED)
Bilirubin, UA: NEGATIVE
Glucose, UA: NEGATIVE
Ketones, UA: NEGATIVE
Nitrite, UA: NEGATIVE
Protein, UA: NEGATIVE
Spec Grav, UA: 1.025 (ref 1.010–1.025)
Urobilinogen, UA: 1 U/dL
pH, UA: 6.5 (ref 5.0–8.0)

## 2023-02-12 MED ORDER — NITROFURANTOIN MONOHYD MACRO 100 MG PO CAPS: 100.0000 mg | ORAL_CAPSULE | Freq: Two times a day (BID) | ORAL | 0 refills | Status: AC

## 2023-02-12 NOTE — Assessment & Plan Note (Signed)
-  Patient states that she has had abdominal discomfort over the last 2 weeks described as fullness in her bladder.  She thought this would improve but has not noted any improvement in her symptoms -Over the last 2 to 3 days she has noted increased urinary frequency and states that she needs to use the restroom all the time -Today, she noted some drops of blood on wiping after urination -She denied any nausea or vomiting.  No flank pain.  No fevers or chills -Her urine dipstick showed small blood and small leukocytes -Her symptoms are concerning for her urinary tract infection -I will prescribe Macrobid  to complete a 5-day course for her -Patient symptoms worsen or remain persistent she will follow-up with us

## 2023-02-12 NOTE — Patient Instructions (Signed)
-  It was a pleasure meeting you today -I will call in an antibiotic for you for a likely urinary tract infection (Macrobid ) to complete a 5-day course -We will do routine STI testing for chlamydia gonorrhea and trichomonas -Given that you did have foul-smelling vaginal discharge we will also do a cervical swab to rule out a yeast infection -Please follow-up with Dr. Hope and consider testing for HIV and syphilis on follow-up -Please call to give any questions or concerns or if he has any worsening symptoms

## 2023-02-12 NOTE — Progress Notes (Signed)
 Acute Office Visit  Subjective:     Patient ID: Terri Ho, female    DOB: 1985-04-11, 38 y.o.   MRN: [id number]  Chief Complaint  Patient presents with   Acute Visit    Blood in urine     HPI Patient is in today for abdominal discomfort, increased urinary frequency and hematuria.  Patient states that she has had some abdominal discomfort described as full bladder over the last 2 weeks which did not improve.  Over the last 2 to 3 days she has noted increased urinary frequency.  Today, she noted a few drops of blood while wiping after urinating and states that she is not due for her period yet.  Of note, patient does state that she wants screening for STI.  States that she has multiple partners and has had unprotected intercourse with them.  She denies any new partners.  She is uncertain if all her partners are monogamous.  Review of Systems  Constitutional: Negative.  Negative for chills and fever.  HENT: Negative.    Respiratory: Negative.    Cardiovascular: Negative.   Gastrointestinal:  Positive for abdominal pain (Complains of fullness over her bladder region). Negative for nausea and vomiting.  Genitourinary:  Positive for frequency, hematuria and urgency. Negative for dysuria and flank pain.  Neurological: Negative.   Psychiatric/Behavioral: Negative.          Objective:    BP 116/68   Pulse 79   Temp 98.4 F (36.9 C)   Ht 5' 3 (1.6 m)   Wt 133 lb 6.4 oz (60.5 kg)   SpO2 99%   BMI 23.63 kg/m    Physical Exam Constitutional:      Appearance: Normal appearance.  HENT:     Head: Normocephalic and atraumatic.  Cardiovascular:     Rate and Rhythm: Normal rate and regular rhythm.     Heart sounds: Normal heart sounds.  Pulmonary:     Effort: Pulmonary effort is normal.     Breath sounds: Normal breath sounds. No wheezing or rales.  Abdominal:     General: Bowel sounds are normal. There is no distension.     Palpations: Abdomen is soft.      Tenderness: There is no abdominal tenderness. There is no guarding.  Neurological:     Mental Status: She is alert.  Psychiatric:        Mood and Affect: Mood normal.        Behavior: Behavior normal.    No results found for any visits on 02/12/23.      Assessment & Plan:   Problem List Items Addressed This Visit       Genitourinary   UTI (urinary tract infection)   -Patient states that she has had abdominal discomfort over the last 2 weeks described as fullness in her bladder.  She thought this would improve but has not noted any improvement in her symptoms -Over the last 2 to 3 days she has noted increased urinary frequency and states that she needs to use the restroom all the time -Today, she noted some drops of blood on wiping after urination -She denied any nausea or vomiting.  No flank pain.  No fevers or chills -Her urine dipstick showed small blood and small leukocytes -Her symptoms are concerning for her urinary tract infection -I will prescribe Macrobid  to complete a 5-day course for her -Patient symptoms worsen or remain persistent she will follow-up with us       Relevant  Medications   nitrofurantoin , macrocrystal-monohydrate, (MACROBID ) 100 MG capsule   Other Relevant Orders   POCT Urinalysis Dipstick (Automated)   Cervicovaginal ancillary only( [place])     Other   Routine screening for STI (sexually transmitted infection) - Primary   -Patient states that she has had multiple sexual partners over the last 2 years and that her last intercourse was 3 days ago -She is uncertain if one of her partners is monogamous or not -She would like to be tested for STIs today -She declines testing for HIV or syphilis at this time.  Risks and benefits of doing the testing were discussed with her.  She states that she will think about this and discuss this further with her PCP -Will send a cervical swab to test for chlamydia/gonorrhea/trichomonas -No further workup at this  time      Relevant Orders   Cervicovaginal ancillary only( [place])   Vaginal discharge   -Patient complained of foul-smelling vaginal discharge over the last week which is since resolved -She states that it was yellowish in color -No bleeding noted at the time -She is concerned for an underlying yeast infection as well as a possible STI -She did not want to be tested for HIV or syphilis at this time -Will send a cervical self swab to the lab to test for chlamydia/gonorrhea/trichomonas as well as yeast and BV -No further workup at this time       Meds ordered this encounter  Medications   nitrofurantoin , macrocrystal-monohydrate, (MACROBID ) 100 MG capsule    Sig: Take 1 capsule (100 mg total) by mouth 2 (two) times daily for 5 days.    Dispense:  10 capsule    Refill:  0    No follow-ups on file.  Elizjah Noblet, MD

## 2023-02-12 NOTE — Assessment & Plan Note (Signed)
-  Patient complained of foul-smelling vaginal discharge over the last week which is since resolved -She states that it was yellowish in color -No bleeding noted at the time -She is concerned for an underlying yeast infection as well as a possible STI -She did not want to be tested for HIV or syphilis at this time -Will send a cervical self swab to the lab to test for chlamydia/gonorrhea/trichomonas as well as yeast and BV -No further workup at this time

## 2023-02-12 NOTE — Assessment & Plan Note (Signed)
-  Patient states that she has had multiple sexual partners over the last 2 years and that her last intercourse was 3 days ago -She is uncertain if one of her partners is monogamous or not -She would like to be tested for STIs today -She declines testing for HIV or syphilis at this time.  Risks and benefits of doing the testing were discussed with her.  She states that she will think about this and discuss this further with her PCP -Will send a cervical swab to test for chlamydia/gonorrhea/trichomonas -No further workup at this time

## 2023-02-12 NOTE — Telephone Encounter (Signed)
 Copied from CRM [id number]. Topic: Clinical - Red Word Triage >> Feb 12, 2023  9:16 AM Deaijah H wrote: Red Word that prompted transfer to Nurse Triage: UTI / urinating with blood and urine with smell  Chief Complaint: blood in urine Symptoms: dark yellow urine and blood when she wipes x3 this am Frequency: constant; started today Pertinent Negatives: Patient denies fever, pain Disposition: [] ED /[] Urgent Care (no appt availability in office) / [x] Appointment(In office/virtual)/ []  [facility]/ [] Home Care/ [] Refused Recommended Disposition /[] [facility]/ []  Follow-up with PCP Additional Notes: patient states has not been feeling well lately, very tired;  states noticed blood in urine this am x 3.  Apt made for this afternoon per patient request.  Care advice given, denies questions, instructed to go to er if becomes worse.   Reason for Disposition  Side (flank) or back pain present  Answer Assessment - Initial Assessment Questions 1. COLOR of URINE: Describe the color of the urine.  (e.g., tea-colored, pink, red, bloody) Do you have blood clots in your urine? (e.g., none, pea, grape, small coin)     Dark yellow 2. ONSET: When did the bleeding start?      This am feels like I have to go constantly 3. EPISODES: How many times has there been blood in the urine? or How many times today?     3 times 4. PAIN with URINATION: Is there any pain with passing your urine? If Yes, ask: How bad is the pain?  (Scale 1-10; or mild, moderate, severe)    - MILD: Complains slightly about urination hurting.    - MODERATE: Interferes with normal activities.      - SEVERE: Excruciating, unwilling or unable to urinate because of the pain.      Mild; discomfort 5. FEVER: Do you have a fever? If Yes, ask: What is your temperature, how was it measured, and when did it start?     denies 6. ASSOCIATED SYMPTOMS: Are you passing urine more frequently than usual?      yes 7. OTHER SYMPTOMS: Do you have any other symptoms? (e.g., back/flank pain, abdomen pain, vomiting)     Back pain and really tired all the time.  Protocols used: Urine - Blood In-A-AH

## 2023-02-12 NOTE — Telephone Encounter (Signed)
 Noted.

## 2023-02-14 ENCOUNTER — Ambulatory Visit: Payer: BC Managed Care – PPO | Admitting: Nurse Practitioner

## 2023-02-14 LAB — CERVICOVAGINAL ANCILLARY ONLY
Bacterial Vaginitis (gardnerella): NEGATIVE
Candida Glabrata: NEGATIVE
Candida Vaginitis: NEGATIVE
Chlamydia: NEGATIVE
Comment: NEGATIVE
Comment: NEGATIVE
Comment: NEGATIVE
Comment: NEGATIVE
Comment: NEGATIVE
Comment: NORMAL
Neisseria Gonorrhea: NEGATIVE
Trichomonas: NEGATIVE

## 2023-02-17 ENCOUNTER — Telehealth: Payer: Self-pay

## 2023-02-17 NOTE — Telephone Encounter (Signed)
 Left message to call the office back regarding the results below. Okay to give results when Patient calls back.

## 2023-02-17 NOTE — Telephone Encounter (Signed)
-----   Message from Hans Baptist sent at 02/17/2023  7:54 AM EST ----- Regarding: Results Hello Almarie, Can you let her know that her swab was negative for STDs and yeast.   Thanks, Dr. Baptist ----- Message ----- From: Sebastian Almarie LABOR, CMA Sent: 02/12/2023   3:53 PM EST To: Hans Baptist, MD

## 2023-03-03 ENCOUNTER — Other Ambulatory Visit: Payer: Self-pay

## 2023-03-05 ENCOUNTER — Telehealth: Payer: Self-pay

## 2023-03-05 ENCOUNTER — Encounter: Payer: Self-pay | Admitting: Family Medicine

## 2023-03-05 NOTE — Telephone Encounter (Signed)
I left a voicemail for patient asking her to please call us to reschedule her 03/06/2023 appointment with Dr. Dana Allan, as provider will not be in the office.  I also sent a letter to patient via MyChart.  When patient calls back, please reschedule this appointment.

## 2023-03-06 ENCOUNTER — Ambulatory Visit: Payer: BC Managed Care – PPO | Admitting: Family Medicine

## 2023-03-26 ENCOUNTER — Ambulatory Visit (INDEPENDENT_AMBULATORY_CARE_PROVIDER_SITE_OTHER): Payer: BC Managed Care – PPO | Admitting: Podiatry

## 2023-03-26 ENCOUNTER — Ambulatory Visit (INDEPENDENT_AMBULATORY_CARE_PROVIDER_SITE_OTHER): Payer: BC Managed Care – PPO

## 2023-03-26 ENCOUNTER — Encounter: Payer: Self-pay | Admitting: Podiatry

## 2023-03-26 VITALS — Ht 63.0 in | Wt 133.4 lb

## 2023-03-26 DIAGNOSIS — M79672 Pain in left foot: Secondary | ICD-10-CM | POA: Diagnosis not present

## 2023-03-26 DIAGNOSIS — M2012 Hallux valgus (acquired), left foot: Secondary | ICD-10-CM

## 2023-03-26 DIAGNOSIS — L600 Ingrowing nail: Secondary | ICD-10-CM | POA: Diagnosis not present

## 2023-03-26 DIAGNOSIS — M21612 Bunion of left foot: Secondary | ICD-10-CM

## 2023-03-26 NOTE — Patient Instructions (Signed)
Soak the toenail in warm water and Epsom salt for 15 to 20 minutes.  Massage the skin to peel away the skin over the nail.  Apply Vaseline or Aquaphor to soften the skin here.  Try taping it like we discussed and demonstrated.  Let me know if this does not improve and if you would like to have the ingrown toenail part removed

## 2023-03-26 NOTE — Progress Notes (Signed)
  Subjective:  Patient ID: Terri Ho, female    DOB: [birth year],  MRN: [id number]  Chief Complaint  Patient presents with   Toe Pain    Pt is here due to left great toe pain, states the pain has been there for a month no injury to toe at all, states that it has a throbbing pain and hurts to wear certain shoes.    38 y.o. female presents with the above complaint. History confirmed with patient.  Most of it seems to be around the nail  Objective:  Physical Exam: warm, good capillary refill, no trophic changes or ulcerative lesions, normal DP and PT pulses, and normal sensory exam.  Slight incurvated left hallux medial border.  Tenderness to palpation proximally.  No paronychia.  She has hallux valgus deformity   Radiographs: Multiple views x-ray of the left foot: no fracture, dislocation, swelling or degenerative changes noted, hallux valgus deformity with metatarsus adductus Assessment:   1. Foot pain, left   2. Hallux valgus with bunions, left   3. Ingrowing left great toenail      Plan:  Patient was evaluated and treated and all questions answered.  Discussed etiology and treatment options of ingrown nails.  We discussed partial permanent matricectomy.  She would like to trial soaking ointment and taping the toe nail fold down to see if this alleviates any pain or pressure.  We discussed that hallux valgus increases the risk of ingrown medial border.  Discussed if this returns or does not improve she will let me know and return for partial permanent matricectomy.  Return if symptoms worsen or fail to improve.

## 2023-05-19 ENCOUNTER — Ambulatory Visit

## 2023-05-19 ENCOUNTER — Telehealth: Payer: Self-pay

## 2023-05-19 ENCOUNTER — Other Ambulatory Visit: Payer: Self-pay

## 2023-05-19 ENCOUNTER — Ambulatory Visit: Admitting: Family Medicine

## 2023-05-19 VITALS — BP 96/60 | HR 85 | Temp 98.9°F | Resp 20 | Ht 63.0 in | Wt 140.2 lb

## 2023-05-19 DIAGNOSIS — Z7985 Long-term (current) use of injectable non-insulin antidiabetic drugs: Secondary | ICD-10-CM

## 2023-05-19 DIAGNOSIS — Z13220 Encounter for screening for lipoid disorders: Secondary | ICD-10-CM | POA: Diagnosis not present

## 2023-05-19 DIAGNOSIS — R8290 Unspecified abnormal findings in urine: Secondary | ICD-10-CM

## 2023-05-19 DIAGNOSIS — E118 Type 2 diabetes mellitus with unspecified complications: Secondary | ICD-10-CM

## 2023-05-19 DIAGNOSIS — E538 Deficiency of other specified B group vitamins: Secondary | ICD-10-CM | POA: Diagnosis not present

## 2023-05-19 DIAGNOSIS — E559 Vitamin D deficiency, unspecified: Secondary | ICD-10-CM

## 2023-05-19 DIAGNOSIS — E611 Iron deficiency: Secondary | ICD-10-CM | POA: Diagnosis not present

## 2023-05-19 DIAGNOSIS — K5901 Slow transit constipation: Secondary | ICD-10-CM | POA: Insufficient documentation

## 2023-05-19 DIAGNOSIS — Z020 Encounter for examination for admission to educational institution: Secondary | ICD-10-CM

## 2023-05-19 DIAGNOSIS — R399 Unspecified symptoms and signs involving the genitourinary system: Secondary | ICD-10-CM

## 2023-05-19 DIAGNOSIS — N951 Menopausal and female climacteric states: Secondary | ICD-10-CM | POA: Insufficient documentation

## 2023-05-19 DIAGNOSIS — Z111 Encounter for screening for respiratory tuberculosis: Secondary | ICD-10-CM

## 2023-05-19 DIAGNOSIS — R790 Abnormal level of blood mineral: Secondary | ICD-10-CM | POA: Insufficient documentation

## 2023-05-19 LAB — POCT URINALYSIS DIP (CLINITEK)
Bilirubin, UA: NEGATIVE
Glucose, UA: NEGATIVE mg/dL
Ketones, POC UA: NEGATIVE mg/dL
Nitrite, UA: NEGATIVE
Spec Grav, UA: >=1.03 — AB (ref 1.010–1.025)
Urobilinogen, UA: 0.2 U/dL
pH, UA: 5.5 (ref 5.0–8.0)

## 2023-05-19 LAB — URINALYSIS, ROUTINE W REFLEX MICROSCOPIC
Bilirubin Urine: NEGATIVE
Ketones, ur: NEGATIVE
Nitrite: NEGATIVE
Specific Gravity, Urine: >=1.03 — AB (ref 1.000–1.030)
Total Protein, Urine: NEGATIVE
Urine Glucose: NEGATIVE
Urobilinogen, UA: 0.2 (ref 0.0–1.0)
pH: 6 (ref 5.0–8.0)

## 2023-05-19 LAB — LIPID PANEL
Cholesterol: 127 mg/dL (ref 0–200)
HDL: 58.6 mg/dL (ref >39.00)
LDL Cholesterol: 61 mg/dL (ref 0–99)
NonHDL: 68.15
Total CHOL/HDL Ratio: 2
Triglycerides: 38 mg/dL (ref 0.0–149.0)
VLDL: 7.6 mg/dL (ref 0.0–40.0)

## 2023-05-19 LAB — CBC WITH DIFFERENTIAL/PLATELET
Basophils Absolute: 0 10*3/uL (ref 0.0–0.1)
Basophils Relative: 0.2 % (ref 0.0–3.0)
Eosinophils Absolute: 0.2 10*3/uL (ref 0.0–0.7)
Eosinophils Relative: 4.7 % (ref 0.0–5.0)
HCT: 37.2 % (ref 36.0–46.0)
Hemoglobin: 12.4 g/dL (ref 12.0–15.0)
Lymphocytes Relative: 27.8 % (ref 12.0–46.0)
Lymphs Abs: 1.1 10*3/uL (ref 0.7–4.0)
MCHC: 33.4 g/dL (ref 30.0–36.0)
MCV: 91.5 fl (ref 78.0–100.0)
Monocytes Absolute: 0.4 10*3/uL (ref 0.1–1.0)
Monocytes Relative: 9.1 % (ref 3.0–12.0)
Neutro Abs: 2.3 10*3/uL (ref 1.4–7.7)
Neutrophils Relative %: 58.2 % (ref 43.0–77.0)
Platelets: 253 10*3/uL (ref 150.0–400.0)
RBC: 4.06 Mil/uL (ref 3.87–5.11)
RDW: 14.3 % (ref 11.5–15.5)
WBC: 4 10*3/uL (ref 4.0–10.5)

## 2023-05-19 LAB — MICROALBUMIN / CREATININE URINE RATIO
Creatinine,U: 240.1 mg/dL
Microalb Creat Ratio: 13.2 mg/g (ref 0.0–30.0)
Microalb, Ur: 3.2 mg/dL — ABNORMAL HIGH (ref 0.0–1.9)

## 2023-05-19 LAB — COMPREHENSIVE METABOLIC PANEL WITH GFR
ALT: 16 U/L (ref 0–35)
AST: 18 U/L (ref 0–37)
Albumin: 4 g/dL (ref 3.5–5.2)
Alkaline Phosphatase: 54 U/L (ref 39–117)
BUN: 16 mg/dL (ref 6–23)
CO2: 27 meq/L (ref 19–32)
Calcium: 8.4 mg/dL (ref 8.4–10.5)
Chloride: 106 meq/L (ref 96–112)
Creatinine, Ser: 0.54 mg/dL (ref 0.40–1.20)
GFR: 115.88 mL/min (ref >60.00)
Glucose, Bld: 69 mg/dL — ABNORMAL LOW (ref 70–99)
Potassium: 3.5 meq/L (ref 3.5–5.1)
Sodium: 139 meq/L (ref 135–145)
Total Bilirubin: 0.6 mg/dL (ref 0.2–1.2)
Total Protein: 6.1 g/dL (ref 6.0–8.3)

## 2023-05-19 LAB — VITAMIN D 25 HYDROXY (VIT D DEFICIENCY, FRACTURES): VITD: 25.31 ng/mL — ABNORMAL LOW (ref 30.00–100.00)

## 2023-05-19 LAB — POCT GLYCOSYLATED HEMOGLOBIN (HGB A1C): Hemoglobin A1C: 5 % (ref 4.0–5.6)

## 2023-05-19 LAB — VITAMIN B12: Vitamin B-12: 330 pg/mL (ref 211–911)

## 2023-05-19 NOTE — Patient Instructions (Addendum)
 It was a pleasure meeting you today. Thank you for allowing me to take part in your health care.  Our goals for today as we discussed include:  Schedule fasting lab appointment  We will get some labs today.  If they are abnormal or we need to do something about them, I will call you.  If they are normal, I will send you a message on MyChart (if it is active) or a letter in the mail.  If you don't hear from us  in 2 weeks, please call the office at the number below.   Continue Ozempic 0.5 mg weekly A1c 5.0  Will complete forms when receive results of blood work    This is a list of the screening recommended for you and due dates:  Health Maintenance  Topic Date Due   Pneumococcal Vaccination (1 of 2 - PCV) Never done   COVID-19 Vaccine (5 - 2024-25 season) 10/06/2022   Yearly kidney health urinalysis for diabetes  11/21/2022   Yearly kidney function blood test for diabetes  03/05/2023   Flu Shot  09/05/2023   Hemoglobin A1C  11/18/2023   Pap with HPV screening  02/20/2026   DTaP/Tdap/Td vaccine (4 - Td or Tdap) 09/16/2027   Hepatitis C Screening  Completed   HIV Screening  Completed   HPV Vaccine  Aged Out   Meningitis B Vaccine  Aged Out   Complete foot exam   Discontinued   Eye exam for diabetics  Discontinued      If you have any questions or concerns, please do not hesitate to call the office at [phone].  I look forward to our next visit and until then take care and stay safe.  Regards,   Valli Gaw, MD   [facility]

## 2023-05-19 NOTE — Telephone Encounter (Signed)
 Copied from CRM [id number]. Topic: Clinical - Lab/Test Results >> May 19, 2023  2:32 PM [name] wrote: Reason for CRM: Patient called back about results. Lab results have been relayed. No further questions.

## 2023-05-19 NOTE — Progress Notes (Signed)
 SUBJECTIVE:   Chief Complaint  Patient presents with   Annual Exam   HPI Presents for annual physical  Discussed the use of AI scribe software for clinical note transcription with the patient, who gave verbal consent to proceed.  History of Present Illness Terri Ho is a 38 year old female with diabetes who presents for medication management and routine follow-up.  She is currently managing her diabetes with a 0.5 mg dose of Ozempic . Despite an A1c of 5.0, she is concerned about her appetite and weight gain and wants to increase her dose to 1 mg. She feels her current regimen does not adequately control her appetite.  Her diet is inconsistent, often consisting of fast food such as burgers, fries, and pizza. She acknowledges poor eating habits and does not cook at home due to a busy schedule and responsibilities with her two sons. She reports no time for exercise.  She experiences episodes of urinary incontinence and increased frequency of urination, though there is no associated odor. No chest pain, shortness of breath, or swelling in her legs.  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  Her family history is significant for breast cancer, as her mother had it twice.    PERTINENT PMH / PSH: As above  OBJECTIVE:  BP 96/60   Pulse 85   Temp 98.9 F (37.2 C)   Resp 20   Ht 5\' 3"  (1.6 m)   Wt 140 lb 4 oz (63.6 kg)   SpO2 99%   BMI 24.84 kg/m    Physical Exam Vitals reviewed.  Constitutional:      General: She is not in acute distress.    Appearance: Normal appearance. She is not ill-appearing, toxic-appearing or diaphoretic.  Eyes:     General:        Right eye: No discharge.        Left eye: No discharge.     Conjunctiva/sclera: Conjunctivae normal.  Cardiovascular:     Rate and Rhythm: Normal rate and regular rhythm.     Heart sounds: Normal heart sounds.   Pulmonary:     Effort: Pulmonary effort is normal.     Breath sounds: Normal breath sounds.  Abdominal:     General: Bowel sounds are normal.  Musculoskeletal:        General: Normal range of motion.  Skin:    General: Skin is warm and dry.  Neurological:     General: No focal deficit present.     Mental Status: She is alert and oriented to person, place, and time. Mental status is at baseline.  Psychiatric:        Mood and Affect: Mood normal.        Behavior: Behavior normal.        Thought Content: Thought content normal.        Judgment: Judgment normal.    {Perform Simple Foot Exam  Perform Detailed exam:1} {Insert foot Exam (Optional):30965}      05/19/2023    9:41 AM 02/12/2023    3:25 PM 09/02/2022    2:39 PM 03/04/2022    1:21 PM 11/20/2021    9:21 AM  Depression screen PHQ 2/9  Decreased Interest 1 1 0 1 1  Down, Depressed, Hopeless 0 1 1 0 0  PHQ - 2 Score 1 2 1 1 1   Altered sleeping 1 1 0 1   Tired, decreased energy  2 2 0 2   Change in appetite 0 0 0 1   Feeling bad or failure about yourself  0 0 0 0   Trouble concentrating 1 2 0 1   Moving slowly or fidgety/restless 0 0 0 0   Suicidal thoughts 0 0 0 0   PHQ-9 Score 5 7 1 6    Difficult doing work/chores Somewhat difficult Somewhat difficult Not difficult at all Somewhat difficult       05/19/2023    9:41 AM 02/12/2023    3:25 PM 09/02/2022    2:39 PM 03/04/2022    1:21 PM  GAD 7 : Generalized Anxiety Score  Nervous, Anxious, on Edge 0 1 1 1   Control/stop worrying 0 0 0 1  Worry too much - different things 0 0 1 2  Trouble relaxing 0 0 0 0  Restless 0 0 0 0  Easily annoyed or irritable 1 2 1 2   Afraid - awful might happen 0 0 0 0  Total GAD 7 Score 1 3 3 6   Anxiety Difficulty Somewhat difficult Somewhat difficult Somewhat difficult Somewhat difficult    ASSESSMENT/PLAN:  Type 2 diabetes with complication (HCC) -     Microalbumin / creatinine urine ratio -     Comprehensive metabolic panel with  GFR -     POCT glycosylated hemoglobin (Hb A1C)  Vitamin B12 deficiency (non anemic) -     Vitamin B12  Vitamin D  deficiency -     VITAMIN D  25 Hydroxy (Vit-D Deficiency, Fractures)  Iron  deficiency -     CBC with Differential/Platelet  Urinary tract infection symptoms -     POCT URINALYSIS DIP (CLINITEK)  Encounter for school examination -     Varicella-Zoster Virus Antibody (Immunity Screen), ACIF ,Serum -     Measles/Mumps/Rubella Immunity  Lipid screening -     Lipid panel; Future  Screening-pulmonary TB -     QuantiFERON-TB Gold Plus  Abnormal urinalysis -     Urine Culture -     Urinalysis, Routine w reflex microscopic    Assessment and Plan Assessment & Plan Urinary Symptoms Evaluating for possible UTI. - Order urinalysis. - Send urine sample for further evaluation if urinalysis is positive.  Type 2 Diabetes Mellitus Diabetes well-controlled with A1c of 5.0. Discussed risks of increasing Ozempic  dose and emphasized dietary changes and exercise. - Continue Ozempic  0.5 mg. - Refer to dietitian for nutritional counseling. - Encourage regular exercise, including weight training. - Check fasting blood glucose levels at home.  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 - Administer Tdap vaccine. - Order titers for MMR, Varicella, and Hepatitis B. - Schedule TB test. - Order mammogram. - Schedule fasting labs for cholesterol check.      PDMP reviewed  Return if symptoms worsen or fail to improve, for PCP.  Valli Gaw, MD

## 2023-05-20 LAB — URINE CULTURE
MICRO NUMBER:: 16326028
Result:: NO GROWTH
SPECIMEN QUALITY:: ADEQUATE

## 2023-05-21 ENCOUNTER — Telehealth: Payer: Self-pay

## 2023-05-21 NOTE — Telephone Encounter (Signed)
 Copied from CRM [id number]. Topic: Clinical - Lab/Test Results >> May 19, 2023  2:32 PM [name] wrote: Reason for CRM: Patient called back about results. Lab results have been relayed. No further questions. >> May 21, 2023 10:18 AM [name] wrote: Patient called.. concerned about her UTI?  Thought she would be prescribed medication?  Pls call her at number

## 2023-05-22 ENCOUNTER — Encounter: Payer: Self-pay | Admitting: Family Medicine

## 2023-05-22 ENCOUNTER — Other Ambulatory Visit: Payer: Self-pay | Admitting: Family Medicine

## 2023-05-22 DIAGNOSIS — E559 Vitamin D deficiency, unspecified: Secondary | ICD-10-CM

## 2023-05-22 LAB — QUANTIFERON-TB GOLD PLUS
Mitogen-NIL: >10 [IU]/mL
NIL: 0.02 [IU]/mL
QuantiFERON-TB Gold Plus: NEGATIVE
TB1-NIL: 0 [IU]/mL
TB2-NIL: 0.01 [IU]/mL

## 2023-05-22 LAB — MEASLES/MUMPS/RUBELLA IMMUNITY
Mumps IgG: 34.2 [AU]/ml
Rubella: 4.17 {index}
Rubeola IgG: 48 [AU]/ml

## 2023-05-22 LAB — VARICELLA-ZOSTER VIRUS AB(IMMUNITY SCREEN),ACIF,SERUM: VARICELLA ZOSTER VIRUS AB (IMMUNITY SCR),ACIF SERUM: >=1:4 {titer}

## 2023-05-22 MED ORDER — VITAMIN D (ERGOCALCIFEROL) 1.25 MG (50000 UNIT) PO CAPS: 50000.0000 [IU] | ORAL_CAPSULE | ORAL | 1 refills | Status: AC

## 2023-05-27 ENCOUNTER — Encounter: Payer: Self-pay | Admitting: Family Medicine

## 2023-05-27 DIAGNOSIS — Z111 Encounter for screening for respiratory tuberculosis: Secondary | ICD-10-CM | POA: Insufficient documentation

## 2023-05-27 DIAGNOSIS — Z020 Encounter for examination for admission to educational institution: Secondary | ICD-10-CM | POA: Insufficient documentation

## 2023-05-27 DIAGNOSIS — Z13220 Encounter for screening for lipoid disorders: Secondary | ICD-10-CM | POA: Insufficient documentation

## 2023-05-27 DIAGNOSIS — R8290 Unspecified abnormal findings in urine: Secondary | ICD-10-CM | POA: Insufficient documentation

## 2023-05-27 NOTE — Assessment & Plan Note (Signed)
 Evaluating for possible UTI. - POC urine positive for small amount leuks, negative for nitrates - Urine culture sent. Negative for infection - Increase water intake - Preventative education provided

## 2023-05-27 NOTE — Assessment & Plan Note (Addendum)
 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 - Administer Tdap vaccine. - Order titers for MMR,  Varicella - Received 3 dose series Hepatitis B vaccinations previously - TB quant

## 2023-05-27 NOTE — Assessment & Plan Note (Signed)
 Diabetes well-controlled with A1c of 5.0. Patient requesting increase in dose for fear of weight gain and to decrease current appetite.  Typically nutrition is take out food due to busy lifestyle.  BMI normal.  Discussed risks of increasing Ozempic  dose and emphasized dietary changes and exercise. Recent serum glucose <70. - Continue Ozempic  0.5 mg weekly - Refer to dietitian for nutritional counseling. - Encourage regular exercise, including weight training. - Check fasting blood glucose levels at home.

## 2023-05-27 NOTE — Assessment & Plan Note (Addendum)
 Terri Ho

## 2023-06-16 ENCOUNTER — Other Ambulatory Visit: Payer: Self-pay

## 2023-06-20 ENCOUNTER — Other Ambulatory Visit: Payer: Self-pay

## 2023-06-26 ENCOUNTER — Other Ambulatory Visit: Payer: Self-pay | Admitting: Family Medicine

## 2023-06-26 ENCOUNTER — Other Ambulatory Visit: Payer: Self-pay

## 2023-06-26 DIAGNOSIS — E118 Type 2 diabetes mellitus with unspecified complications: Secondary | ICD-10-CM

## 2023-06-26 MED ORDER — OZEMPIC (0.25 OR 0.5 MG/DOSE) 2 MG/3ML ~~LOC~~ SOPN
0.5000 mg | PEN_INJECTOR | SUBCUTANEOUS | 6 refills | Status: AC
  Filled 2023-06-26 – 2024-01-28 (×2): qty 3, 28d supply, fill #0

## 2024-01-12 ENCOUNTER — Other Ambulatory Visit: Payer: Self-pay

## 2024-01-12 ENCOUNTER — Telehealth: Payer: Self-pay

## 2024-01-12 DIAGNOSIS — Z1231 Encounter for screening mammogram for malignant neoplasm of breast: Secondary | ICD-10-CM

## 2024-01-12 NOTE — Telephone Encounter (Signed)
 Copied from CRM #[id number]. Topic: Clinical - Request for Lab/Test Order >> Jan 12, 2024  9:18 AM Franky GRADE wrote: Reason for CRM: Patient is calling because she is due for her yearly mammography screening, she is a previous patient of Dr.Walsh and has an appointment to establish care on 04/28/2023. She was advised by the breast center to contact our office for the order.

## 2024-01-12 NOTE — Telephone Encounter (Signed)
 Pended mammogram for your review.

## 2024-01-13 NOTE — Telephone Encounter (Signed)
 Mammogram ordered

## 2024-01-13 NOTE — Progress Notes (Signed)
 Please inform patient.  Mammogram has been ordered.

## 2024-01-28 ENCOUNTER — Other Ambulatory Visit: Payer: Self-pay

## 2024-02-09 ENCOUNTER — Ambulatory Visit
Admission: RE | Admit: 2024-02-09 | Discharge: 2024-02-09 | Disposition: A | Discharge status: Home / Self Care | Source: Ambulatory Visit | Attending: Nurse Practitioner | Admitting: Nurse Practitioner

## 2024-02-09 DIAGNOSIS — Z1231 Encounter for screening mammogram for malignant neoplasm of breast: Secondary | ICD-10-CM | POA: Diagnosis present

## 2024-02-10 ENCOUNTER — Other Ambulatory Visit: Payer: Self-pay | Admitting: Nurse Practitioner

## 2024-02-10 ENCOUNTER — Encounter: Payer: Self-pay | Admitting: Nurse Practitioner

## 2024-02-10 DIAGNOSIS — R928 Other abnormal and inconclusive findings on diagnostic imaging of breast: Secondary | ICD-10-CM

## 2024-02-13 ENCOUNTER — Ambulatory Visit
Admission: RE | Admit: 2024-02-13 | Discharge: 2024-02-13 | Disposition: A | Source: Ambulatory Visit | Attending: Nurse Practitioner

## 2024-02-13 DIAGNOSIS — R928 Other abnormal and inconclusive findings on diagnostic imaging of breast: Secondary | ICD-10-CM | POA: Diagnosis present

## 2024-02-20 ENCOUNTER — Telehealth: Payer: Self-pay

## 2024-02-20 ENCOUNTER — Other Ambulatory Visit (HOSPITAL_COMMUNITY)
Admission: RE | Admit: 2024-02-20 | Discharge: 2024-02-20 | Disposition: A | Source: Ambulatory Visit | Attending: Nurse Practitioner | Admitting: Nurse Practitioner

## 2024-02-20 ENCOUNTER — Ambulatory Visit: Admitting: Nurse Practitioner

## 2024-02-20 VITALS — BP 116/74 | HR 78 | Temp 98.5°F | Ht 63.0 in | Wt 147.8 lb

## 2024-02-20 DIAGNOSIS — N898 Other specified noninflammatory disorders of vagina: Secondary | ICD-10-CM | POA: Diagnosis present

## 2024-02-20 DIAGNOSIS — E559 Vitamin D deficiency, unspecified: Secondary | ICD-10-CM

## 2024-02-20 DIAGNOSIS — E611 Iron deficiency: Secondary | ICD-10-CM

## 2024-02-20 DIAGNOSIS — Z7985 Long-term (current) use of injectable non-insulin antidiabetic drugs: Secondary | ICD-10-CM

## 2024-02-20 DIAGNOSIS — E118 Type 2 diabetes mellitus with unspecified complications: Secondary | ICD-10-CM

## 2024-02-20 LAB — COMPREHENSIVE METABOLIC PANEL WITH GFR
ALT: 25 U/L (ref 3–35)
AST: 19 U/L (ref 5–37)
Albumin: 4.1 g/dL (ref 3.5–5.2)
Alkaline Phosphatase: 44 U/L (ref 39–117)
BUN: 12 mg/dL (ref 6–23)
CO2: 27 meq/L (ref 19–32)
Calcium: 8.6 mg/dL (ref 8.4–10.5)
Chloride: 105 meq/L (ref 96–112)
Creatinine, Ser: 0.56 mg/dL (ref 0.40–1.20)
GFR: 114.26 mL/min
Glucose, Bld: 81 mg/dL (ref 70–99)
Potassium: 3.9 meq/L (ref 3.5–5.1)
Sodium: 137 meq/L (ref 135–145)
Total Bilirubin: 0.7 mg/dL (ref 0.2–1.2)
Total Protein: 6.6 g/dL (ref 6.0–8.3)

## 2024-02-20 LAB — VITAMIN D 25 HYDROXY (VIT D DEFICIENCY, FRACTURES): VITD: 34.02 ng/mL (ref 30.00–100.00)

## 2024-02-20 LAB — LIPID PANEL
Cholesterol: 147 mg/dL (ref 28–200)
HDL: 61.4 mg/dL
LDL Cholesterol: 79 mg/dL (ref 10–99)
NonHDL: 85.86
Total CHOL/HDL Ratio: 2
Triglycerides: 35 mg/dL (ref 10.0–149.0)
VLDL: 7 mg/dL (ref 0.0–40.0)

## 2024-02-20 LAB — POCT GLYCOSYLATED HEMOGLOBIN (HGB A1C): Hemoglobin A1C: 5.2 % (ref 4.0–5.6)

## 2024-02-20 LAB — CBC WITH DIFFERENTIAL/PLATELET
Basophils Absolute: 0 K/uL (ref 0.0–0.1)
Basophils Relative: 0.1 % (ref 0.0–3.0)
Eosinophils Absolute: 0.1 K/uL (ref 0.0–0.7)
Eosinophils Relative: 2.9 % (ref 0.0–5.0)
HCT: 38.5 % (ref 36.0–46.0)
Hemoglobin: 12.8 g/dL (ref 12.0–15.0)
Lymphocytes Relative: 32.7 % (ref 12.0–46.0)
Lymphs Abs: 1.2 K/uL (ref 0.7–4.0)
MCHC: 33.4 g/dL (ref 30.0–36.0)
MCV: 88.6 fl (ref 78.0–100.0)
Monocytes Absolute: 0.4 K/uL (ref 0.1–1.0)
Monocytes Relative: 9.4 % (ref 3.0–12.0)
Neutro Abs: 2.1 K/uL (ref 1.4–7.7)
Neutrophils Relative %: 54.9 % (ref 43.0–77.0)
Platelets: 246 K/uL (ref 150.0–400.0)
RBC: 4.34 Mil/uL (ref 3.87–5.11)
RDW: 14 % (ref 11.5–15.5)
WBC: 3.8 K/uL — ABNORMAL LOW (ref 4.0–10.5)

## 2024-02-20 LAB — IBC + FERRITIN
Ferritin: 4.6 ng/mL — ABNORMAL LOW (ref 10.0–291.0)
Iron: 104 ug/dL (ref 42–145)
Saturation Ratios: 25.5 % (ref 20.0–50.0)
TIBC: 407.4 ug/dL (ref 250.0–450.0)
Transferrin: 291 mg/dL (ref 212.0–360.0)

## 2024-02-20 LAB — TSH: TSH: 0.9 u[IU]/mL (ref 0.35–5.50)

## 2024-02-20 MED ORDER — SEMAGLUTIDE (1 MG/DOSE) 4 MG/3ML ~~LOC~~ SOPN: 1.0000 mg | PEN_INJECTOR | SUBCUTANEOUS | 3 refills | Status: AC

## 2024-02-20 NOTE — Assessment & Plan Note (Signed)
 Orders:    POCT glycosylated hemoglobin (Hb A1C)

## 2024-02-20 NOTE — Progress Notes (Unsigned)
 "  Established Patient Office Visit  Subjective:  Patient ID: Terri Ho, female    DOB: [birth year]  Age: 39 y.o. MRN: [id number]  CC:  Chief Complaint  Patient presents with   Establish Care    Transfer of Care   Discussed the use of AI scribe software for clinical note transcription with the patient, who gave verbal consent to proceed.  History of Present Illness   History of Present Illness   Terri Ho is a 39 year old female with type 2 diabetes who presents for transition of care. Her previous PCP was Dr.  and medication management.  She has used Ozempic  for 2 to 3 years, previously at 1 mg and now 0.5 mg. Since the dose reduction she has more difficulty controlling appetite with weight gain and is worried about rising BMI given her early-onset type 2 diabetes and prior weight loss surgery about 8 years ago. She wants to avoid worsening glycemic control and is here to adjust medications.  She has urinary frequency with small volumes and intermittent discomfort, along with occasional vulvar itching but no significant discharge. She wears a liner. She has a new partner but feels these symptoms started before this relationship.  She has iron  deficiency but oral iron  causes constipation. She wants ferritin checked due to persistent cold hands, which she believes are related to low iron . She has been inconsistent with vitamin D  despite past deficiency.  She has family h/o breat cancer in her mother. 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   Mammogram R breast IMPRESSION: Probably benign asymmetry in the LATERAL RIGHT . RIGHT breast diagnostic mammogram in 6 months        Past Medical History:  Diagnosis Date   Anemia    BRCA negative 02/2021   MyRisk testing (BRCA neg, ATM positive)   COVID-19    08/2019, 09/2020   Diabetes mellitus 04/06/2012   TYPE 2; NON INSULIN DEP x 17 years as of 09/17/18    Family history of breast cancer 02/2021    ATM gene on MyRisk testing; increased risk of breast and pancreatic cancer (and prostate)   Genetic testing 02/2021   ATM on MyRisk testing with SDHA VUS   Great toe pain, left 05/12/2019   Increased risk of breast cancer 02/2021   ATM mutation   Iron  deficiency    Leiomyoma    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   [facility] MyRisk testing   Prediabetes 06/21/2020   Supervision of high risk pregnancy, antepartum, first trimester 03/28/2017   Clinic [facility] Prenatal Labs Dating 8wk US  Blood type: O/Positive/-- (02/22 1504)  Genetic Screen  NIPS: normal XY Antibody:Negative (02/22 1504) Anatomic US   Rubella: 3.62 (02/22 1504) Varicella: Immune GTT  Has diabetes RPR: Non Reactive (02/22 1504)  Rhogam  HBsAg: Negative (02/22 1504)  TDaP vaccine                        Flu Shot: HIV: Non Reactive (02/22 1504)  Baby Food                       Vaginal spotting 09/17/2018   Vitamin D  deficiency     Past Surgical History:  Procedure Laterality Date   BARIATRIC SURGERY  01/2014   gastric sleeve Dr. Thom Pin    BREAST REDUCTION SURGERY Bilateral 2020   BUTTOCK LIFT     11/2015 Dr. Glyn in [place]  CESAREAN SECTION  10/28/2012   CESAREAN SECTION N/A 11/03/2017   Procedure: CESAREAN SECTION- URGENT;  Surgeon: Leonce Garnette BIRCH, MD;  Location: [facility] ORS;  Service: Obstetrics;  Laterality: N/A;   TUMMY TUCK  11/2015   Dr. Glyn     Family History  Problem Relation Age of Onset   Cancer Mother        breast x 2   Breast cancer Mother 51       again at 27   Hypertension Mother    Congestive Heart Failure Father    COPD Father    Diabetes Father        TYPE 2   Heart disease Father        chf   Kidney cancer Paternal Aunt 45   Colon cancer Neg Hx     Social History   Socioeconomic History   Marital status: Married    Spouse name: Not on file   Number of children: 1   Years of education: 16   Highest education level: Master's degree (e.g., MA, MS, MEng, MEd, MSW, MBA)  Occupational  History   Occupation: nurse    Comment: Transport Planner  Tobacco Use   Smoking status: Never   Smokeless tobacco: Never  Vaping Use   Vaping status: Never Used  Substance and Sexual Activity   Alcohol use: No    Alcohol/week: 0.0 standard drinks of alcohol   Drug use: No   Sexual activity: Yes    Birth control/protection: I.U.D.    Comment: Paragard   Other Topics Concern   Not on file  Social History Narrative   3 and 41 y.o boys as of 08/02/21    Married husband DPR [employer] [phone]   Works part time [employer]    Social Drivers of Health   Tobacco Use: Low Risk (02/20/2024)   Patient History    Smoking Tobacco Use: Never    Smokeless Tobacco Use: Never    Passive Exposure: Not on file  Financial Resource Strain: Low Risk (02/20/2024)   Overall Financial Resource Strain (CARDIA)    Difficulty of Paying Living Expenses: Not very hard  Food Insecurity: No Food Insecurity (02/20/2024)   Epic    Worried About Programme Researcher, Broadcasting/film/video in the Last Year: Never true    Ran Out of Food in the Last Year: Never true  Transportation Needs: No Transportation Needs (02/20/2024)   Epic    Lack of Transportation (Medical): No    Lack of Transportation (Non-Medical): No  Physical Activity: Unknown (05/19/2023)   Exercise Vital Sign    Days of Exercise per Week: 0 days    Minutes of Exercise per Session: Not on file  Stress: No Stress Concern Present (05/19/2023)   [employer] of Occupational Health - Occupational Stress Questionnaire    Feeling of Stress : Only a little  Social Connections: Moderately Isolated (02/20/2024)   Social Connection and Isolation Panel    Frequency of Communication with Friends and Family: More than three times a week    Frequency of Social Gatherings with Friends and Family: Twice a week    Attends Religious Services: Never    Database Administrator or Organizations: No    Attends Banker Meetings: Not on file    Marital  Status: Married  Intimate Partner Violence: Unknown (05/11/2021)   Received from [facility]   HITS    Physically Hurt: Not on file    Insult  or Talk Down To: Not on file    Threaten Physical Harm: Not on file    Scream or Curse: Not on file  Depression (PHQ2-9): Low Risk (02/20/2024)   Depression (PHQ2-9)    PHQ-2 Score: 1  Alcohol Screen: Low Risk (02/20/2024)   Alcohol Screen    Last Alcohol Screening Score (AUDIT): 2  Housing: Low Risk (02/20/2024)   Epic    Unable to Pay for Housing in the Last Year: No    Number of Times Moved in the Last Year: 0    Homeless in the Last Year: No  Utilities: Not on file  Health Literacy: Not on file     Outpatient Medications Prior to Visit  Medication Sig Dispense Refill   PARAGARD  INTRAUTERINE COPPER  IU by Intrauterine route.     Semaglutide ,0.25 or 0.5MG /DOS, (OZEMPIC , 0.25 OR 0.5 MG/DOSE,) 2 MG/3ML SOPN Inject 0.5 mg into the skin once a week. 3 mL 6   Vitamin D , Ergocalciferol , (DRISDOL ) 1.25 MG (50000 UNIT) CAPS capsule Take 1 capsule (50,000 Units total) by mouth every 7 (seven) days. 12 capsule 1   No facility-administered medications prior to visit.    Allergies[1]  ROS Review of Systems Negative unless indicated in HPI.    Objective:    Physical Exam Constitutional:      Appearance: Normal appearance.  HENT:     Right Ear: Tympanic membrane normal.     Left Ear: Tympanic membrane normal.     Mouth/Throat:     Mouth: Mucous membranes are moist.  Eyes:     Conjunctiva/sclera: Conjunctivae normal.     Pupils: Pupils are equal, round, and reactive to light.  Cardiovascular:     Rate and Rhythm: Normal rate and regular rhythm.     Pulses: Normal pulses.     Heart sounds: Normal heart sounds.  Pulmonary:     Effort: Pulmonary effort is normal.     Breath sounds: Normal breath sounds.  Abdominal:     General: Bowel sounds are normal.     Palpations: Abdomen is soft. There is no mass.     Tenderness: There is no  guarding.  Musculoskeletal:     Cervical back: Normal range of motion. No tenderness.  Skin:    General: Skin is warm.     Findings: No bruising.  Neurological:     General: No focal deficit present.     Mental Status: She is alert and oriented to person, place, and time. Mental status is at baseline.  Psychiatric:        Mood and Affect: Mood normal.        Behavior: Behavior normal.        Thought Content: Thought content normal.        Judgment: Judgment normal.     BP 116/74   Pulse 78   Temp 98.5 F (36.9 C)   Ht 5' 3 (1.6 m)   Wt 147 lb 12.8 oz (67 kg)   LMP 01/19/2024   SpO2 98%   BMI 26.18 kg/m  Wt Readings from Last 3 Encounters:  02/20/24 147 lb 12.8 oz (67 kg)  05/19/23 140 lb 4 oz (63.6 kg)  03/26/23 133 lb 6.4 oz (60.5 kg)     Health Maintenance  Topic Date Due   Pneumococcal Vaccine (1 of 2 - PCV) Never done   Hepatitis B Vaccines 19-59 Average Risk (1 of 3 - 19+ 3-dose series) Never done   COVID-19 Vaccine (5 - 2025-26 season)  03/07/2024 (Originally 10/06/2023)   Diabetic kidney evaluation - Urine ACR  05/18/2024   HEMOGLOBIN A1C  08/19/2024   Diabetic kidney evaluation - eGFR measurement  02/19/2025   Mammogram  02/08/2026   Cervical Cancer Screening (HPV/Pap Cotest)  02/20/2026   DTaP/Tdap/Td (4 - Td or Tdap) 09/16/2027   Influenza Vaccine  Completed   HPV VACCINES (No Doses Required) Completed   Hepatitis C Screening  Completed   HIV Screening  Completed   Meningococcal B Vaccine  Aged Out   FOOT EXAM  Discontinued   OPHTHALMOLOGY EXAM  Discontinued       Topic Date Due   Hepatitis B Vaccines 19-59 Average Risk (1 of 3 - 19+ 3-dose series) Never done    Lab Results  Component Value Date   TSH 0.90 02/20/2024   Lab Results  Component Value Date   WBC 3.8 (L) 02/20/2024   HGB 12.8 02/20/2024   HCT 38.5 02/20/2024   MCV 88.6 02/20/2024   PLT 246.0 02/20/2024   Lab Results  Component Value Date   NA 137 02/20/2024   K 3.9  02/20/2024   CO2 27 02/20/2024   GLUCOSE 81 02/20/2024   BUN 12 02/20/2024   CREATININE 0.56 02/20/2024   BILITOT 0.7 02/20/2024   ALKPHOS 44 02/20/2024   AST 19 02/20/2024   ALT 25 02/20/2024   PROT 6.6 02/20/2024   ALBUMIN 4.1 02/20/2024   CALCIUM 8.6 02/20/2024   ANIONGAP 6 (L) 01/19/2014   GFR 114.26 02/20/2024   Lab Results  Component Value Date   CHOL 147 02/20/2024   Lab Results  Component Value Date   HDL 61.40 02/20/2024   Lab Results  Component Value Date   LDLCALC 79 02/20/2024   Lab Results  Component Value Date   TRIG 35.0 02/20/2024   Lab Results  Component Value Date   CHOLHDL 2 02/20/2024   Lab Results  Component Value Date   HGBA1C 5.2 02/20/2024      Assessment & Plan:   Assessment & Plan Vitamin D  deficiency Previous level of 25. Inconsistent supplementation. - Ordered vitamin D  level. - Advised consistent vitamin D  supplementation.     Orders:   Vitamin D  (25 hydroxy)  Type 2 diabetes with complication (HCC) Well-controlled with A1c of 5.2. Difficulty controlling appetite and weight gain on reduced Ozempic  dose.  - Increased Ozempic  to 1 mg. - Instructed to check insurance coverage for [employer]. - Scheduled follow-up in 6 months to assess weight and diabetes control. Orders:   Lipid panel   Comprehensive metabolic panel with GFR   TSH   POCT glycosylated hemoglobin (Hb A1C)  Iron  deficiency Previous iron  supplementation caused constipation. Reports feeling cold, possibly related to iron  deficiency. Prefers dietary management. - Ordered ferritin level. - Advised dietary intake of iron -rich foods such as green leafy vegetables, dry fruits Orders:   CBC with Differential/Platelet   IBC + Ferritin  Vaginal irritation Intermittent irritation with mild itching, no significant discharge.  - Vaginal swab performed, Pt tolerated well. - Obtained urine specimen for UTI testing. Orders:   Urinalysis, Routine w reflex microscopic    Urine Culture   Cervicovaginal ancillary only( [place])   Assessment & Plan    Follow-up: Return in about 6 months (around 08/19/2024) for chronic management.   Chelsea Aurora, NP      [1] No Known Allergies  "

## 2024-02-20 NOTE — Assessment & Plan Note (Signed)
 Terri Ho

## 2024-02-20 NOTE — Telephone Encounter (Signed)
 Patient stated Pharmacy said her Ozempic  needs a PA.

## 2024-02-20 NOTE — Assessment & Plan Note (Signed)
 Previous iron  supplementation caused constipation. Reports feeling cold, possibly related to iron  deficiency. Prefers dietary management. - Ordered ferritin level. - Advised dietary intake of iron -rich foods such as green leafy vegetables, dry fruits Orders:   CBC with Differential/Platelet   IBC + Ferritin

## 2024-02-21 LAB — URINALYSIS, ROUTINE W REFLEX MICROSCOPIC
Bilirubin, UA: NEGATIVE
Glucose, UA: NEGATIVE
Ketones, UA: NEGATIVE
Leukocytes,UA: NEGATIVE
Nitrite, UA: NEGATIVE
Protein,UA: NEGATIVE
RBC, UA: NEGATIVE
Specific Gravity, UA: 1.017 (ref 1.005–1.030)
Urobilinogen, Ur: 0.2 mg/dL (ref 0.2–1.0)
pH, UA: 6.5 (ref 5.0–7.5)

## 2024-02-23 LAB — CERVICOVAGINAL ANCILLARY ONLY
Bacterial Vaginitis (gardnerella): NEGATIVE
Chlamydia: NEGATIVE
Comment: NEGATIVE
Comment: NEGATIVE
Comment: NEGATIVE
Comment: NORMAL
Neisseria Gonorrhea: NEGATIVE
Trichomonas: NEGATIVE

## 2024-02-23 LAB — URINE CULTURE

## 2024-02-24 ENCOUNTER — Ambulatory Visit: Payer: Self-pay | Admitting: Nurse Practitioner

## 2024-02-26 ENCOUNTER — Other Ambulatory Visit (HOSPITAL_COMMUNITY): Payer: Self-pay

## 2024-02-26 DIAGNOSIS — N898 Other specified noninflammatory disorders of vagina: Secondary | ICD-10-CM | POA: Insufficient documentation

## 2024-02-26 NOTE — Assessment & Plan Note (Addendum)
 Intermittent irritation with mild itching, no significant discharge.  - Vaginal swab performed, Pt tolerated well. - Obtained urine specimen for UTI testing. Orders:   Urinalysis, Routine w reflex microscopic   Urine Culture   Cervicovaginal ancillary only( [place])

## 2024-02-27 ENCOUNTER — Other Ambulatory Visit (HOSPITAL_COMMUNITY): Payer: Self-pay

## 2024-03-09 ENCOUNTER — Encounter: Payer: Self-pay | Admitting: Nurse Practitioner

## 2024-03-12 ENCOUNTER — Other Ambulatory Visit (HOSPITAL_COMMUNITY): Payer: Self-pay

## 2024-03-12 ENCOUNTER — Telehealth: Payer: Self-pay

## 2024-03-12 NOTE — Telephone Encounter (Signed)
 Pharmacy Patient Advocate Encounter   Received notification from Pt Calls Messages that prior authorization for Ozempic  (1 MG/DOSE) 4MG /3ML pen-injectors is required/requested.   Insurance verification completed.   The patient is insured through [facility].   Per test claim: PA required; PA submitted to above mentioned insurance via Latent Key/confirmation #/EOC [id number] Status is pending

## 2024-04-27 ENCOUNTER — Encounter: Admitting: Nurse Practitioner

## 2024-08-20 ENCOUNTER — Ambulatory Visit: Admitting: Nurse Practitioner
